# Patient Record
Sex: Male | Born: 1940 | Race: White | Hispanic: No | Marital: Married | Smoking: Current some day smoker
Health system: Southern US, Community
[De-identification: ages and names within clinical notes are randomized; demographics above are authoritative.]

## PROBLEM LIST (undated history)

## (undated) DIAGNOSIS — E785 Hyperlipidemia, unspecified: Secondary | ICD-10-CM

## (undated) DIAGNOSIS — I779 Disorder of arteries and arterioles, unspecified: Secondary | ICD-10-CM

## (undated) DIAGNOSIS — I2510 Atherosclerotic heart disease of native coronary artery without angina pectoris: Secondary | ICD-10-CM

## (undated) DIAGNOSIS — M109 Gout, unspecified: Secondary | ICD-10-CM

## (undated) DIAGNOSIS — I10 Essential (primary) hypertension: Secondary | ICD-10-CM

## (undated) DIAGNOSIS — E119 Type 2 diabetes mellitus without complications: Secondary | ICD-10-CM

## (undated) DIAGNOSIS — I4891 Unspecified atrial fibrillation: Secondary | ICD-10-CM

## (undated) HISTORY — PX: SPINE SURGERY: SHX786

## (undated) HISTORY — PX: CORONARY STENT PLACEMENT: SHX1402

## (undated) HISTORY — PX: REPLACEMENT TOTAL KNEE: SUR1224

## (undated) HISTORY — PX: PROSTATE SURGERY: SHX751

---

## 2020-02-29 ENCOUNTER — Emergency Department (HOSPITAL_COMMUNITY): Payer: Medicare Other | Admitting: Anesthesiology

## 2020-02-29 ENCOUNTER — Emergency Department (HOSPITAL_COMMUNITY): Payer: Medicare Other

## 2020-02-29 ENCOUNTER — Other Ambulatory Visit: Payer: Self-pay

## 2020-02-29 ENCOUNTER — Encounter (HOSPITAL_COMMUNITY): Admission: EM | Disposition: E | Payer: Self-pay | Source: Home / Self Care | Attending: Neurology

## 2020-02-29 ENCOUNTER — Encounter (HOSPITAL_COMMUNITY): Payer: Self-pay | Admitting: Physician Assistant

## 2020-02-29 ENCOUNTER — Inpatient Hospital Stay (HOSPITAL_COMMUNITY): Payer: Medicare Other

## 2020-02-29 ENCOUNTER — Inpatient Hospital Stay (HOSPITAL_COMMUNITY)
Admission: EM | Admit: 2020-02-29 | Discharge: 2020-04-07 | DRG: 023 | Disposition: E | Payer: Medicare Other | Attending: Neurology | Admitting: Neurology

## 2020-02-29 DIAGNOSIS — Z515 Encounter for palliative care: Secondary | ICD-10-CM | POA: Diagnosis not present

## 2020-02-29 DIAGNOSIS — G8929 Other chronic pain: Secondary | ICD-10-CM | POA: Diagnosis present

## 2020-02-29 DIAGNOSIS — H53462 Homonymous bilateral field defects, left side: Secondary | ICD-10-CM | POA: Diagnosis present

## 2020-02-29 DIAGNOSIS — I63423 Cerebral infarction due to embolism of bilateral anterior cerebral arteries: Secondary | ICD-10-CM

## 2020-02-29 DIAGNOSIS — G936 Cerebral edema: Secondary | ICD-10-CM | POA: Diagnosis present

## 2020-02-29 DIAGNOSIS — E876 Hypokalemia: Secondary | ICD-10-CM | POA: Diagnosis not present

## 2020-02-29 DIAGNOSIS — E1165 Type 2 diabetes mellitus with hyperglycemia: Secondary | ICD-10-CM | POA: Diagnosis present

## 2020-02-29 DIAGNOSIS — Z66 Do not resuscitate: Secondary | ICD-10-CM | POA: Diagnosis not present

## 2020-02-29 DIAGNOSIS — D696 Thrombocytopenia, unspecified: Secondary | ICD-10-CM | POA: Diagnosis not present

## 2020-02-29 DIAGNOSIS — I361 Nonrheumatic tricuspid (valve) insufficiency: Secondary | ICD-10-CM | POA: Diagnosis not present

## 2020-02-29 DIAGNOSIS — F17200 Nicotine dependence, unspecified, uncomplicated: Secondary | ICD-10-CM | POA: Diagnosis not present

## 2020-02-29 DIAGNOSIS — Z4659 Encounter for fitting and adjustment of other gastrointestinal appliance and device: Secondary | ICD-10-CM

## 2020-02-29 DIAGNOSIS — M549 Dorsalgia, unspecified: Secondary | ICD-10-CM | POA: Diagnosis present

## 2020-02-29 DIAGNOSIS — J9600 Acute respiratory failure, unspecified whether with hypoxia or hypercapnia: Secondary | ICD-10-CM | POA: Diagnosis not present

## 2020-02-29 DIAGNOSIS — R509 Fever, unspecified: Secondary | ICD-10-CM

## 2020-02-29 DIAGNOSIS — E870 Hyperosmolality and hypernatremia: Secondary | ICD-10-CM | POA: Diagnosis not present

## 2020-02-29 DIAGNOSIS — N400 Enlarged prostate without lower urinary tract symptoms: Secondary | ICD-10-CM | POA: Diagnosis present

## 2020-02-29 DIAGNOSIS — J9621 Acute and chronic respiratory failure with hypoxia: Secondary | ICD-10-CM | POA: Diagnosis not present

## 2020-02-29 DIAGNOSIS — Z6831 Body mass index (BMI) 31.0-31.9, adult: Secondary | ICD-10-CM | POA: Diagnosis not present

## 2020-02-29 DIAGNOSIS — J988 Other specified respiratory disorders: Secondary | ICD-10-CM | POA: Diagnosis not present

## 2020-02-29 DIAGNOSIS — E1151 Type 2 diabetes mellitus with diabetic peripheral angiopathy without gangrene: Secondary | ICD-10-CM | POA: Diagnosis present

## 2020-02-29 DIAGNOSIS — I63411 Cerebral infarction due to embolism of right middle cerebral artery: Secondary | ICD-10-CM | POA: Diagnosis present

## 2020-02-29 DIAGNOSIS — R627 Adult failure to thrive: Secondary | ICD-10-CM | POA: Diagnosis not present

## 2020-02-29 DIAGNOSIS — K219 Gastro-esophageal reflux disease without esophagitis: Secondary | ICD-10-CM

## 2020-02-29 DIAGNOSIS — Z9911 Dependence on respirator [ventilator] status: Secondary | ICD-10-CM

## 2020-02-29 DIAGNOSIS — R0603 Acute respiratory distress: Secondary | ICD-10-CM | POA: Diagnosis not present

## 2020-02-29 DIAGNOSIS — Z20822 Contact with and (suspected) exposure to covid-19: Secondary | ICD-10-CM | POA: Diagnosis present

## 2020-02-29 DIAGNOSIS — Z8249 Family history of ischemic heart disease and other diseases of the circulatory system: Secondary | ICD-10-CM

## 2020-02-29 DIAGNOSIS — R791 Abnormal coagulation profile: Secondary | ICD-10-CM | POA: Diagnosis present

## 2020-02-29 DIAGNOSIS — I2510 Atherosclerotic heart disease of native coronary artery without angina pectoris: Secondary | ICD-10-CM | POA: Diagnosis present

## 2020-02-29 DIAGNOSIS — J9601 Acute respiratory failure with hypoxia: Secondary | ICD-10-CM | POA: Diagnosis not present

## 2020-02-29 DIAGNOSIS — E669 Obesity, unspecified: Secondary | ICD-10-CM | POA: Diagnosis present

## 2020-02-29 DIAGNOSIS — E7800 Pure hypercholesterolemia, unspecified: Secondary | ICD-10-CM | POA: Diagnosis not present

## 2020-02-29 DIAGNOSIS — J690 Pneumonitis due to inhalation of food and vomit: Secondary | ICD-10-CM | POA: Diagnosis not present

## 2020-02-29 DIAGNOSIS — E119 Type 2 diabetes mellitus without complications: Secondary | ICD-10-CM

## 2020-02-29 DIAGNOSIS — J9622 Acute and chronic respiratory failure with hypercapnia: Secondary | ICD-10-CM | POA: Diagnosis not present

## 2020-02-29 DIAGNOSIS — I639 Cerebral infarction, unspecified: Secondary | ICD-10-CM

## 2020-02-29 DIAGNOSIS — R1310 Dysphagia, unspecified: Secondary | ICD-10-CM | POA: Diagnosis present

## 2020-02-29 DIAGNOSIS — I480 Paroxysmal atrial fibrillation: Secondary | ICD-10-CM | POA: Diagnosis present

## 2020-02-29 DIAGNOSIS — I6389 Other cerebral infarction: Secondary | ICD-10-CM | POA: Diagnosis not present

## 2020-02-29 DIAGNOSIS — I482 Chronic atrial fibrillation, unspecified: Secondary | ICD-10-CM | POA: Diagnosis not present

## 2020-02-29 DIAGNOSIS — D6832 Hemorrhagic disorder due to extrinsic circulating anticoagulants: Secondary | ICD-10-CM | POA: Diagnosis present

## 2020-02-29 DIAGNOSIS — Z7901 Long term (current) use of anticoagulants: Secondary | ICD-10-CM | POA: Diagnosis not present

## 2020-02-29 DIAGNOSIS — I63511 Cerebral infarction due to unspecified occlusion or stenosis of right middle cerebral artery: Secondary | ICD-10-CM | POA: Diagnosis present

## 2020-02-29 DIAGNOSIS — M109 Gout, unspecified: Secondary | ICD-10-CM | POA: Diagnosis present

## 2020-02-29 DIAGNOSIS — I63429 Cerebral infarction due to embolism of unspecified anterior cerebral artery: Secondary | ICD-10-CM

## 2020-02-29 DIAGNOSIS — E785 Hyperlipidemia, unspecified: Secondary | ICD-10-CM | POA: Diagnosis present

## 2020-02-29 DIAGNOSIS — I10 Essential (primary) hypertension: Secondary | ICD-10-CM | POA: Diagnosis present

## 2020-02-29 DIAGNOSIS — Z96651 Presence of right artificial knee joint: Secondary | ICD-10-CM | POA: Diagnosis present

## 2020-02-29 DIAGNOSIS — Z823 Family history of stroke: Secondary | ICD-10-CM

## 2020-02-29 DIAGNOSIS — I70208 Unspecified atherosclerosis of native arteries of extremities, other extremity: Secondary | ICD-10-CM | POA: Diagnosis present

## 2020-02-29 DIAGNOSIS — R29704 NIHSS score 4: Secondary | ICD-10-CM | POA: Diagnosis present

## 2020-02-29 DIAGNOSIS — R1312 Dysphagia, oropharyngeal phase: Secondary | ICD-10-CM | POA: Diagnosis not present

## 2020-02-29 DIAGNOSIS — Z7189 Other specified counseling: Secondary | ICD-10-CM | POA: Diagnosis not present

## 2020-02-29 DIAGNOSIS — I350 Nonrheumatic aortic (valve) stenosis: Secondary | ICD-10-CM | POA: Diagnosis not present

## 2020-02-29 DIAGNOSIS — F17290 Nicotine dependence, other tobacco product, uncomplicated: Secondary | ICD-10-CM | POA: Diagnosis present

## 2020-02-29 DIAGNOSIS — D72829 Elevated white blood cell count, unspecified: Secondary | ICD-10-CM | POA: Diagnosis not present

## 2020-02-29 DIAGNOSIS — I63131 Cerebral infarction due to embolism of right carotid artery: Secondary | ICD-10-CM | POA: Diagnosis not present

## 2020-02-29 DIAGNOSIS — I6521 Occlusion and stenosis of right carotid artery: Secondary | ICD-10-CM | POA: Diagnosis not present

## 2020-02-29 DIAGNOSIS — R29810 Facial weakness: Secondary | ICD-10-CM | POA: Diagnosis not present

## 2020-02-29 DIAGNOSIS — G8194 Hemiplegia, unspecified affecting left nondominant side: Secondary | ICD-10-CM | POA: Diagnosis present

## 2020-02-29 DIAGNOSIS — I4891 Unspecified atrial fibrillation: Secondary | ICD-10-CM | POA: Diagnosis present

## 2020-02-29 DIAGNOSIS — T45515A Adverse effect of anticoagulants, initial encounter: Secondary | ICD-10-CM | POA: Diagnosis present

## 2020-02-29 DIAGNOSIS — Z955 Presence of coronary angioplasty implant and graft: Secondary | ICD-10-CM

## 2020-02-29 HISTORY — PX: IR ANGIO INTRA EXTRACRAN SEL COM CAROTID INNOMINATE UNI L MOD SED: IMG5358

## 2020-02-29 HISTORY — PX: IR PERCUTANEOUS ART THROMBECTOMY/INFUSION INTRACRANIAL INC DIAG ANGIO: IMG6087

## 2020-02-29 HISTORY — PX: IR ANGIO INTRA EXTRACRAN SEL INTERNAL CAROTID UNI L MOD SED: IMG5361

## 2020-02-29 HISTORY — DX: Gout, unspecified: M10.9

## 2020-02-29 HISTORY — DX: Type 2 diabetes mellitus without complications: E11.9

## 2020-02-29 HISTORY — DX: Disorder of arteries and arterioles, unspecified: I77.9

## 2020-02-29 HISTORY — DX: Unspecified atrial fibrillation: I48.91

## 2020-02-29 HISTORY — PX: IR INTRAVSC STENT CERV CAROTID W/O EMB-PROT MOD SED INC ANGIO: IMG2304

## 2020-02-29 HISTORY — DX: Atherosclerotic heart disease of native coronary artery without angina pectoris: I25.10

## 2020-02-29 HISTORY — PX: IR CT HEAD LTD: IMG2386

## 2020-02-29 HISTORY — DX: Essential (primary) hypertension: I10

## 2020-02-29 HISTORY — DX: Hyperlipidemia, unspecified: E78.5

## 2020-02-29 HISTORY — PX: IR US GUIDE VASC ACCESS RIGHT: IMG2390

## 2020-02-29 HISTORY — PX: RADIOLOGY WITH ANESTHESIA: SHX6223

## 2020-02-29 LAB — POCT I-STAT 7, (LYTES, BLD GAS, ICA,H+H)
Acid-base deficit: 1 mmol/L (ref 0.0–2.0)
Bicarbonate: 23.8 mmol/L (ref 20.0–28.0)
Calcium, Ion: 1.21 mmol/L (ref 1.15–1.40)
HCT: 38 % — ABNORMAL LOW (ref 39.0–52.0)
Hemoglobin: 12.9 g/dL — ABNORMAL LOW (ref 13.0–17.0)
O2 Saturation: 100 %
Patient temperature: 98.6
Potassium: 4.4 mmol/L (ref 3.5–5.1)
Sodium: 139 mmol/L (ref 135–145)
TCO2: 25 mmol/L (ref 22–32)
pCO2 arterial: 37.5 mmHg (ref 32.0–48.0)
pH, Arterial: 7.412 (ref 7.350–7.450)
pO2, Arterial: 195 mmHg — ABNORMAL HIGH (ref 83.0–108.0)

## 2020-02-29 LAB — I-STAT CHEM 8, ED
BUN: 29 mg/dL — ABNORMAL HIGH (ref 8–23)
BUN: 15 mg/dL (ref 8–23)
Calcium, Ion: 1.13 mmol/L — ABNORMAL LOW (ref 1.15–1.40)
Calcium, Ion: 0.93 mmol/L — ABNORMAL LOW (ref 1.15–1.40)
Chloride: 102 mmol/L (ref 98–111)
Chloride: 114 mmol/L — ABNORMAL HIGH (ref 98–111)
Creatinine, Ser: 1 mg/dL (ref 0.61–1.24)
Creatinine, Ser: 0.3 mg/dL — ABNORMAL LOW (ref 0.61–1.24)
Glucose, Bld: 163 mg/dL — ABNORMAL HIGH (ref 70–99)
Glucose, Bld: 90 mg/dL (ref 70–99)
HCT: 42 % (ref 39.0–52.0)
HCT: 23 % — ABNORMAL LOW (ref 39.0–52.0)
Hemoglobin: 14.3 g/dL (ref 13.0–17.0)
Hemoglobin: 7.8 g/dL — ABNORMAL LOW (ref 13.0–17.0)
Potassium: 3.6 mmol/L (ref 3.5–5.1)
Potassium: 2.4 mmol/L — CL (ref 3.5–5.1)
Sodium: 140 mmol/L (ref 135–145)
Sodium: 147 mmol/L — ABNORMAL HIGH (ref 135–145)
TCO2: 25 mmol/L (ref 22–32)
TCO2: 17 mmol/L — ABNORMAL LOW (ref 22–32)

## 2020-02-29 LAB — CBC
HCT: 42.1 % (ref 39.0–52.0)
HCT: 39.3 % (ref 39.0–52.0)
Hemoglobin: 13.6 g/dL (ref 13.0–17.0)
Hemoglobin: 13.1 g/dL (ref 13.0–17.0)
MCH: 32.5 pg (ref 26.0–34.0)
MCH: 31.7 pg (ref 26.0–34.0)
MCHC: 32.3 g/dL (ref 30.0–36.0)
MCHC: 33.3 g/dL (ref 30.0–36.0)
MCV: 100.5 fL — ABNORMAL HIGH (ref 80.0–100.0)
MCV: 95.2 fL (ref 80.0–100.0)
Platelets: 161 10*3/uL (ref 150–400)
Platelets: 125 10*3/uL — ABNORMAL LOW (ref 150–400)
RBC: 4.19 MIL/uL — ABNORMAL LOW (ref 4.22–5.81)
RBC: 4.13 MIL/uL — ABNORMAL LOW (ref 4.22–5.81)
RDW: 13.2 % (ref 11.5–15.5)
RDW: 14.1 % (ref 11.5–15.5)
WBC: 6.3 10*3/uL (ref 4.0–10.5)
WBC: 6.1 10*3/uL (ref 4.0–10.5)
nRBC: 0 % (ref 0.0–0.2)
nRBC: 0 % (ref 0.0–0.2)

## 2020-02-29 LAB — PROTIME-INR
INR: 1.4 — ABNORMAL HIGH (ref 0.8–1.2)
Prothrombin Time: 16.9 seconds — ABNORMAL HIGH (ref 11.4–15.2)

## 2020-02-29 LAB — DIFFERENTIAL
Abs Immature Granulocytes: 0.02 10*3/uL (ref 0.00–0.07)
Basophils Absolute: 0.1 10*3/uL (ref 0.0–0.1)
Basophils Relative: 1 %
Eosinophils Absolute: 0.3 10*3/uL (ref 0.0–0.5)
Eosinophils Relative: 4 %
Immature Granulocytes: 0 %
Lymphocytes Relative: 18 %
Lymphs Abs: 1.2 10*3/uL (ref 0.7–4.0)
Monocytes Absolute: 0.6 10*3/uL (ref 0.1–1.0)
Monocytes Relative: 9 %
Neutro Abs: 4.2 10*3/uL (ref 1.7–7.7)
Neutrophils Relative %: 68 %

## 2020-02-29 LAB — COMPREHENSIVE METABOLIC PANEL
ALT: 23 U/L (ref 0–44)
AST: 21 U/L (ref 15–41)
Albumin: 3.4 g/dL — ABNORMAL LOW (ref 3.5–5.0)
Alkaline Phosphatase: 82 U/L (ref 38–126)
Anion gap: 8 (ref 5–15)
BUN: 26 mg/dL — ABNORMAL HIGH (ref 8–23)
CO2: 29 mmol/L (ref 22–32)
Calcium: 9.3 mg/dL (ref 8.9–10.3)
Chloride: 103 mmol/L (ref 98–111)
Creatinine, Ser: 1.06 mg/dL (ref 0.61–1.24)
GFR, Estimated: >60 mL/min (ref >60)
Glucose, Bld: 176 mg/dL — ABNORMAL HIGH (ref 70–99)
Potassium: 3.7 mmol/L (ref 3.5–5.1)
Sodium: 140 mmol/L (ref 135–145)
Total Bilirubin: 1.3 mg/dL — ABNORMAL HIGH (ref 0.3–1.2)
Total Protein: 6.3 g/dL — ABNORMAL LOW (ref 6.5–8.1)

## 2020-02-29 LAB — ABO/RH: ABO/RH(D): A POS

## 2020-02-29 LAB — MRSA PCR SCREENING: MRSA by PCR: NEGATIVE

## 2020-02-29 LAB — APTT: aPTT: 30 seconds (ref 24–36)

## 2020-02-29 LAB — GLUCOSE, CAPILLARY: Glucose-Capillary: 174 mg/dL — ABNORMAL HIGH (ref 70–99)

## 2020-02-29 LAB — PREPARE RBC (CROSSMATCH)

## 2020-02-29 LAB — CBG MONITORING, ED: Glucose-Capillary: 167 mg/dL — ABNORMAL HIGH (ref 70–99)

## 2020-02-29 LAB — RESPIRATORY PANEL BY RT PCR (FLU A&B, COVID)
Influenza A by PCR: NEGATIVE
Influenza B by PCR: NEGATIVE
SARS Coronavirus 2 by RT PCR: NEGATIVE

## 2020-02-29 LAB — POCT ACTIVATED CLOTTING TIME: Activated Clotting Time: 164 seconds

## 2020-02-29 SURGERY — IR WITH ANESTHESIA
Anesthesia: General

## 2020-02-29 MED ORDER — TICAGRELOR 90 MG PO TABS
ORAL_TABLET | ORAL | Status: AC
  Filled 2020-02-29: qty 2

## 2020-02-29 MED ORDER — CLEVIDIPINE BUTYRATE 0.5 MG/ML IV EMUL: 0.0000 mg/h | INTRAVENOUS | Status: DC

## 2020-02-29 MED ORDER — ALTEPLASE (STROKE) FULL DOSE INFUSION
0.9000 mg/kg | Freq: Once | INTRAVENOUS | Status: DC
  Filled 2020-02-29: qty 100

## 2020-02-29 MED ORDER — PROPOFOL 1000 MG/100ML IV EMUL
0.0000 ug/kg/min | INTRAVENOUS | Status: DC
  Administered 2020-02-29: 45 ug/kg/min via INTRAVENOUS
  Administered 2020-03-01 (×2): 30 ug/kg/min via INTRAVENOUS
  Administered 2020-03-01: 15 ug/kg/min via INTRAVENOUS
  Administered 2020-03-01: 25 ug/kg/min via INTRAVENOUS
  Administered 2020-03-02: 15 ug/kg/min via INTRAVENOUS
  Filled 2020-02-29 (×6): qty 100

## 2020-02-29 MED ORDER — IOHEXOL 240 MG/ML SOLN
INTRAMUSCULAR | Status: AC
  Filled 2020-02-29: qty 200

## 2020-02-29 MED ORDER — SODIUM CHLORIDE 0.9% IV SOLUTION: Freq: Once | INTRAVENOUS | Status: DC

## 2020-02-29 MED ORDER — PHENYLEPHRINE HCL-NACL 10-0.9 MG/250ML-% IV SOLN
INTRAVENOUS | Status: DC | PRN
  Administered 2020-02-29: 30 ug/min via INTRAVENOUS

## 2020-02-29 MED ORDER — CANGRELOR BOLUS VIA INFUSION
INTRAVENOUS | Status: DC | PRN
  Administered 2020-02-29: 1305 ug via INTRAVENOUS

## 2020-02-29 MED ORDER — FENTANYL CITRATE (PF) 250 MCG/5ML IJ SOLN
INTRAMUSCULAR | Status: DC | PRN
  Administered 2020-02-29: 100 ug via INTRAVENOUS

## 2020-02-29 MED ORDER — SENNOSIDES-DOCUSATE SODIUM 8.6-50 MG PO TABS: 1.0000 | ORAL_TABLET | Freq: Every evening | ORAL | Status: DC | PRN

## 2020-02-29 MED ORDER — DEXAMETHASONE SODIUM PHOSPHATE 10 MG/ML IJ SOLN
INTRAMUSCULAR | Status: DC | PRN
  Administered 2020-02-29: 10 mg via INTRAVENOUS

## 2020-02-29 MED ORDER — EPTIFIBATIDE 20 MG/10ML IV SOLN
INTRAVENOUS | Status: AC
  Filled 2020-02-29: qty 10

## 2020-02-29 MED ORDER — ROCURONIUM BROMIDE 10 MG/ML (PF) SYRINGE
PREFILLED_SYRINGE | INTRAVENOUS | Status: DC | PRN
  Administered 2020-02-29: 20 mg via INTRAVENOUS
  Administered 2020-02-29: 60 mg via INTRAVENOUS
  Administered 2020-02-29: 20 mg via INTRAVENOUS
  Administered 2020-02-29: 50 mg via INTRAVENOUS

## 2020-02-29 MED ORDER — IOHEXOL 300 MG/ML  SOLN
50.0000 mL | Freq: Once | INTRAMUSCULAR | Status: AC | PRN
  Administered 2020-02-29: 40 mL via INTRA_ARTERIAL

## 2020-02-29 MED ORDER — SODIUM CHLORIDE 0.9 % IV SOLN: 50.0000 mL | Freq: Once | INTRAVENOUS | Status: DC

## 2020-02-29 MED ORDER — ALBUMIN HUMAN 5 % IV SOLN: INTRAVENOUS | Status: DC | PRN

## 2020-02-29 MED ORDER — SODIUM CHLORIDE 0.9% FLUSH: 3.0000 mL | Freq: Once | INTRAVENOUS | Status: DC

## 2020-02-29 MED ORDER — ORAL CARE MOUTH RINSE: 15.0000 mL | OROMUCOSAL | Status: DC

## 2020-02-29 MED ORDER — ORAL CARE MOUTH RINSE
15.0000 mL | OROMUCOSAL | Status: DC
  Administered 2020-02-29 – 2020-03-02 (×18): 15 mL via OROMUCOSAL

## 2020-02-29 MED ORDER — CANGRELOR TETRASODIUM 50 MG IV SOLR
INTRAVENOUS | Status: AC
  Filled 2020-02-29: qty 50

## 2020-02-29 MED ORDER — SUCCINYLCHOLINE CHLORIDE 200 MG/10ML IV SOSY
PREFILLED_SYRINGE | INTRAVENOUS | Status: DC | PRN
  Administered 2020-02-29: 100 mg via INTRAVENOUS

## 2020-02-29 MED ORDER — ASPIRIN 325 MG PO TABS
ORAL_TABLET | ORAL | Status: DC | PRN
  Administered 2020-02-29: 650 mg

## 2020-02-29 MED ORDER — ACETAMINOPHEN 325 MG PO TABS
650.0000 mg | ORAL_TABLET | ORAL | Status: DC | PRN
  Filled 2020-02-29: qty 2

## 2020-02-29 MED ORDER — POTASSIUM CHLORIDE 10 MEQ/100ML IV SOLN
10.0000 meq | INTRAVENOUS | Status: AC
  Administered 2020-02-29 (×2): 10 meq via INTRAVENOUS
  Filled 2020-02-29 (×3): qty 100

## 2020-02-29 MED ORDER — POLYETHYLENE GLYCOL 3350 17 G PO PACK
17.0000 g | PACK | Freq: Every day | ORAL | Status: DC
  Administered 2020-03-02 – 2020-03-10 (×3): 17 g
  Filled 2020-02-29 (×5): qty 1

## 2020-02-29 MED ORDER — VERAPAMIL HCL 2.5 MG/ML IV SOLN
INTRAVENOUS | Status: AC
  Filled 2020-02-29: qty 2

## 2020-02-29 MED ORDER — ENOXAPARIN SODIUM 40 MG/0.4ML ~~LOC~~ SOLN
40.0000 mg | SUBCUTANEOUS | Status: DC
  Administered 2020-02-29 – 2020-03-09 (×10): 40 mg via SUBCUTANEOUS
  Filled 2020-02-29 (×10): qty 0.4

## 2020-02-29 MED ORDER — CHLORHEXIDINE GLUCONATE CLOTH 2 % EX PADS
6.0000 | MEDICATED_PAD | Freq: Every day | CUTANEOUS | Status: DC
  Administered 2020-02-29 – 2020-03-11 (×12): 6 via TOPICAL

## 2020-02-29 MED ORDER — INSULIN ASPART 100 UNIT/ML ~~LOC~~ SOLN
0.0000 [IU] | SUBCUTANEOUS | Status: DC
  Administered 2020-02-29 – 2020-03-01 (×2): 3 [IU] via SUBCUTANEOUS
  Administered 2020-03-01: 2 [IU] via SUBCUTANEOUS
  Administered 2020-03-01 – 2020-03-02 (×2): 3 [IU] via SUBCUTANEOUS
  Administered 2020-03-02 (×2): 2 [IU] via SUBCUTANEOUS
  Administered 2020-03-03: 5 [IU] via SUBCUTANEOUS
  Administered 2020-03-03: 2 [IU] via SUBCUTANEOUS
  Administered 2020-03-03: 3 [IU] via SUBCUTANEOUS
  Administered 2020-03-03: 8 [IU] via SUBCUTANEOUS
  Administered 2020-03-03: 11 [IU] via SUBCUTANEOUS
  Administered 2020-03-03 – 2020-03-04 (×2): 5 [IU] via SUBCUTANEOUS
  Administered 2020-03-04 (×3): 3 [IU] via SUBCUTANEOUS
  Administered 2020-03-04: 2 [IU] via SUBCUTANEOUS
  Administered 2020-03-04 (×2): 3 [IU] via SUBCUTANEOUS
  Administered 2020-03-05: 5 [IU] via SUBCUTANEOUS
  Administered 2020-03-05: 3 [IU] via SUBCUTANEOUS
  Administered 2020-03-05 (×3): 5 [IU] via SUBCUTANEOUS
  Administered 2020-03-06: 3 [IU] via SUBCUTANEOUS
  Administered 2020-03-06 (×3): 5 [IU] via SUBCUTANEOUS
  Administered 2020-03-06: 3 [IU] via SUBCUTANEOUS
  Administered 2020-03-06: 8 [IU] via SUBCUTANEOUS
  Administered 2020-03-07 (×2): 3 [IU] via SUBCUTANEOUS
  Administered 2020-03-07: 8 [IU] via SUBCUTANEOUS
  Administered 2020-03-07: 2 [IU] via SUBCUTANEOUS
  Administered 2020-03-07: 5 [IU] via SUBCUTANEOUS
  Administered 2020-03-07: 3 [IU] via SUBCUTANEOUS
  Administered 2020-03-07: 5 [IU] via SUBCUTANEOUS
  Administered 2020-03-08: 3 [IU] via SUBCUTANEOUS
  Administered 2020-03-08 (×4): 5 [IU] via SUBCUTANEOUS
  Administered 2020-03-08: 8 [IU] via SUBCUTANEOUS
  Administered 2020-03-09: 3 [IU] via SUBCUTANEOUS
  Administered 2020-03-09 (×4): 5 [IU] via SUBCUTANEOUS
  Administered 2020-03-10 (×2): 3 [IU] via SUBCUTANEOUS
  Administered 2020-03-10: 5 [IU] via SUBCUTANEOUS
  Administered 2020-03-10: 8 [IU] via SUBCUTANEOUS
  Administered 2020-03-10: 3 [IU] via SUBCUTANEOUS

## 2020-02-29 MED ORDER — ACETAMINOPHEN 160 MG/5ML PO SOLN
650.0000 mg | ORAL | Status: DC | PRN
  Administered 2020-03-02 – 2020-03-08 (×8): 650 mg
  Filled 2020-02-29 (×8): qty 20.3

## 2020-02-29 MED ORDER — ACETAMINOPHEN 650 MG RE SUPP: 650.0000 mg | RECTAL | Status: DC | PRN

## 2020-02-29 MED ORDER — LACTATED RINGERS IV SOLN: INTRAVENOUS | Status: DC | PRN

## 2020-02-29 MED ORDER — DOCUSATE SODIUM 50 MG/5ML PO LIQD
100.0000 mg | Freq: Two times a day (BID) | ORAL | Status: DC
  Administered 2020-03-01 – 2020-03-10 (×8): 100 mg
  Filled 2020-02-29 (×14): qty 10

## 2020-02-29 MED ORDER — MIDAZOLAM HCL 2 MG/2ML IJ SOLN: 1.0000 mg | INTRAMUSCULAR | Status: DC | PRN

## 2020-02-29 MED ORDER — ASPIRIN 81 MG PO CHEW
CHEWABLE_TABLET | ORAL | Status: AC
  Filled 2020-02-29: qty 1

## 2020-02-29 MED ORDER — LIDOCAINE 2% (20 MG/ML) 5 ML SYRINGE
INTRAMUSCULAR | Status: DC | PRN
  Administered 2020-02-29: 60 mg via INTRAVENOUS

## 2020-02-29 MED ORDER — SODIUM CHLORIDE 0.9 % IV SOLN
INTRAVENOUS | Status: DC | PRN
  Administered 2020-02-29: 2 ug/kg/min via INTRAVENOUS

## 2020-02-29 MED ORDER — FENTANYL CITRATE (PF) 250 MCG/5ML IJ SOLN
INTRAMUSCULAR | Status: AC
  Filled 2020-02-29: qty 5

## 2020-02-29 MED ORDER — TIROFIBAN HCL IN NACL 5-0.9 MG/100ML-% IV SOLN
INTRAVENOUS | Status: AC
  Filled 2020-02-29: qty 100

## 2020-02-29 MED ORDER — PANTOPRAZOLE SODIUM 40 MG IV SOLR
40.0000 mg | Freq: Every day | INTRAVENOUS | Status: DC
  Administered 2020-02-29 – 2020-03-01 (×2): 40 mg via INTRAVENOUS
  Filled 2020-02-29: qty 40

## 2020-02-29 MED ORDER — CHLORHEXIDINE GLUCONATE 0.12% ORAL RINSE (MEDLINE KIT)
15.0000 mL | Freq: Two times a day (BID) | OROMUCOSAL | Status: DC
  Administered 2020-02-29 – 2020-03-02 (×4): 15 mL via OROMUCOSAL

## 2020-02-29 MED ORDER — CLOPIDOGREL BISULFATE 300 MG PO TABS
ORAL_TABLET | ORAL | Status: AC
  Filled 2020-02-29: qty 1

## 2020-02-29 MED ORDER — IOHEXOL 300 MG/ML  SOLN
150.0000 mL | Freq: Once | INTRAMUSCULAR | Status: AC | PRN
  Administered 2020-02-29: 100 mL via INTRA_ARTERIAL

## 2020-02-29 MED ORDER — IOHEXOL 350 MG/ML SOLN
40.0000 mL | Freq: Once | INTRAVENOUS | Status: AC | PRN
  Administered 2020-02-29: 40 mL via INTRAVENOUS

## 2020-02-29 MED ORDER — ASPIRIN 325 MG PO TABS
ORAL_TABLET | ORAL | Status: AC
  Filled 2020-02-29: qty 2

## 2020-02-29 MED ORDER — FENTANYL CITRATE (PF) 100 MCG/2ML IJ SOLN: 25.0000 ug | INTRAMUSCULAR | Status: DC | PRN

## 2020-02-29 MED ORDER — SODIUM CHLORIDE 0.9 % IV SOLN: INTRAVENOUS | Status: DC

## 2020-02-29 MED ORDER — STROKE: EARLY STAGES OF RECOVERY BOOK
Freq: Once | Status: DC
  Filled 2020-02-29: qty 1

## 2020-02-29 MED ORDER — CHLORHEXIDINE GLUCONATE 0.12% ORAL RINSE (MEDLINE KIT): 15.0000 mL | Freq: Two times a day (BID) | OROMUCOSAL | Status: DC

## 2020-02-29 MED ORDER — IOHEXOL 350 MG/ML SOLN
75.0000 mL | Freq: Once | INTRAVENOUS | Status: AC | PRN
  Administered 2020-02-29: 75 mL via INTRAVENOUS

## 2020-02-29 MED ORDER — PROPOFOL 10 MG/ML IV BOLUS
INTRAVENOUS | Status: DC | PRN
  Administered 2020-02-29: 100 mg via INTRAVENOUS
  Administered 2020-02-29: 40 ug/kg/min via INTRAVENOUS

## 2020-02-29 NOTE — Anesthesia Postprocedure Evaluation (Signed)
Anesthesia Post Note  Patient: Richard Farmer  Procedure(s) Performed: IR WITH ANESTHESIA - CODE STROKE (N/A )     Patient location during evaluation: SICU Anesthesia Type: General Level of consciousness: sedated Pain management: pain level controlled Vital Signs Assessment: post-procedure vital signs reviewed and stable Respiratory status: patient remains intubated per anesthesia plan Cardiovascular status: stable Postop Assessment: no apparent nausea or vomiting Anesthetic complications: no   No complications documented.  Last Vitals:  Vitals:   03/08/2020 2000 03-08-2020 2100  BP: (!) 150/109 (!) 141/102  Pulse:  76  Resp: 15 16  SpO2:  100%    Last Pain:  Vitals:   Mar 08, 2020 1403  PainSc: 0-No pain                 Jazir Newey DAVID

## 2020-02-29 NOTE — Transfer of Care (Signed)
Immediate Anesthesia Transfer of Care Note  Patient: Richard Farmer  Procedure(s) Performed: IR WITH ANESTHESIA - CODE STROKE (N/A )  Patient Location: ICU  Anesthesia Type:General  Level of Consciousness: Patient remains intubated per anesthesia plan  Airway & Oxygen Therapy: Patient remains intubated per anesthesia plan and Patient placed on Ventilator (see vital sign flow sheet for setting)  Post-op Assessment: Report given to RN and Post -op Vital signs reviewed and stable  Post vital signs: Reviewed and stable  Last Vitals:  Vitals Value Taken Time  BP 172/116 02/23/2020 1944  Temp    Pulse 78 02/08/2020 1944  Resp 17 02/11/2020 1954  SpO2 100 % 02/22/2020 1944  Vitals shown include unvalidated device data.  Last Pain:  Vitals:   02/24/2020 1403  PainSc: 0-No pain     Report to Fleet Contras RN in ICU, RT at bedside applied to ventilator, breath sounds present bilaterally, VSS, informed RN of SBP goals of 160-193mmHg, neo, propofol and K replacement infusing per orders, SBP within goal range, foley maintained, transfer of patient care in safe and stable condition.   2 units of blood administered during procedure for Hgb 7.8; informed RN of need for CBC.  Complications: No complications documented.

## 2020-02-29 NOTE — Sedation Documentation (Signed)
Attempted to call report to 4N ICU RN to notify that patient will be coming to unit intubated and needed a ventilator set up, and on multiple IV drips.  Unit secretary stated it is shift change and to call back later

## 2020-02-29 NOTE — Procedures (Addendum)
Neuro-Interventional Radiology  Post Cerebral Angiogram Procedure Note  Operator:    Dr. Loreta Ave Assistant:   None  History:   79 yo male presents with acute left sided symptoms, with right ICA occlusion.  Waxing waning physical exam, worsening in the upright position, improved in the supine position.   History of left CEA, with CT changes of chronic dissection.   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   Procedure: US guided right CFA access  Cervical & Cerebral Angiogram  PTA and stenting of the right ICA, 66mm-7mm Xact stent, presuming a permissive lesion of the bifurcation.   Attempt of thrombectomy of the intracranial ICA below terminus.   Attempt at thrombectomy of the right MCA/M1 (embolism new territory)   Deployment of Angioseal  Flat Panel CT in NIR  Combination of stent retrievers and aspiration was performed.  Embotrap and Solitaire were used, as well as aspiration catheters.   Findings:    Initial: ICA occlusion Final: ICA remains occluded after ICA stent, secondary to occlusion at the supraclinoid segment.  Right M1 remains occluded, embolization to new territory.  Anesthesia:   GETA  EBL:    500cc     Complication:  Embolization new territory, right m1   Recommendations: - Will remain intubated overnight - right hip straight overnight - permissive HTN, up to 180 SBP - Frequent NV checks - Repeat CT or MRI imaging recommended within 36 hours, discretion of Neurology - NIR to follow Family updated: Richard Farmer (wife) [phone]    Signed,  Yvone Neu. Loreta Ave, DO

## 2020-02-29 NOTE — Progress Notes (Signed)
Per RN, MRI can be done tomorrow due to chance of pt being extubated.

## 2020-02-29 NOTE — Anesthesia Preprocedure Evaluation (Addendum)
Anesthesia Evaluation  Patient identified by MRN, date of birth, ID band Patient awake    Reviewed: Allergy & Precautions, H&P , NPO status , Patient's Chart, lab work & pertinent test results  Airway Mallampati: II  TM Distance: >3 FB Neck ROM: Full    Dental no notable dental hx. (+) Teeth Intact, Dental Advisory Given   Pulmonary neg pulmonary ROS,    Pulmonary exam normal breath sounds clear to auscultation       Cardiovascular hypertension, + Peripheral Vascular Disease  Atrial Fibrillation  Rhythm:Irregular Rate:Normal     Neuro/Psych CVA, Residual Symptoms negative psych ROS   GI/Hepatic negative GI ROS, Neg liver ROS,   Endo/Other  negative endocrine ROS  Renal/GU negative Renal ROS  negative genitourinary   Musculoskeletal   Abdominal   Peds  Hematology negative hematology ROS (+)   Anesthesia Other Findings   Reproductive/Obstetrics negative OB ROS                            Anesthesia Physical Anesthesia Plan  ASA: III and emergent  Anesthesia Plan: General   Post-op Pain Management:    Induction: Intravenous, Rapid sequence and Cricoid pressure planned  PONV Risk Score and Plan: 2 and Treatment may vary due to age or medical condition  Airway Management Planned: Oral ETT  Additional Equipment: Arterial line  Intra-op Plan:   Post-operative Plan: Extubation in OR and Possible Post-op intubation/ventilation  Informed Consent: I have reviewed the patients History and Physical, chart, labs and discussed the procedure including the risks, benefits and alternatives for the proposed anesthesia with the patient or authorized representative who has indicated his/her understanding and acceptance.     Dental advisory given  Plan Discussed with: CRNA  Anesthesia Plan Comments:         Anesthesia Quick Evaluation

## 2020-02-29 NOTE — H&P (Addendum)
NEUROLOGY CONSULTATION NOTE   Date of service: February 29, 2020 Patient Name: Richard Farmer MRN:  [id number] DOB:  [birth year] Reason for consult: "Stroke code"  History of Present Illness  Richard Farmer is a 79 y.o. male with PMH significant for afibb on warfarin, known carotid stenosis who presents with acute onset left sided weakness.  Patient woke up this morning feeling weak in both legs. His wife Richard Farmer states that he was holding on to the walls while walking to the bathroom but he sometimes does that when he wakes up. He was able to dress up and seemed to be doing fine, he was walking normally and walked to the kitchen fine. He reported that his legs were weak again and family noted that while he attemped to sit in the chair, he was somewhat slumping towards the left and when he stood up, he was slumping to the left side. He looked different but was talking coherently.  He took his morning medications, he is on warfarin but his INR ar presentation was 1.4.  He was noted on initial evaluation to have left sided weakness with left arm drift and leg drift but his symptoms seem to have improved to an NIHSS of 4.  He was noted by me later during exam to have left visual field cut and throughout the time in the ED, he was noted to havefluctuation of his symptoms. He did discussed tPA for the noted visual field cut. However, by the time tPA was mixed and ready for administration, the hemianopsia resolved and we held off on tPA.  NIHSS: 1 for mild L arm drift but good L hand grip strength with intact Left hand push and pull MRS: 1 TPA: was offered to patient initially due to L hemianopsia but held off after resolution of the hemianopsia. Thrombectomy: Not pursued but given concern for unstable R ICA disease, case discussed with stroke team and with IR and sent for urgent stenting.   ROS   Unable to obtain due to the acuity of the situation.  Past History  History reviewed. No pertinent past  medical history. History reviewed. No pertinent surgical history. History reviewed. No pertinent family history. Social History   Socioeconomic History  . Marital status: Married    Spouse name: Not on file  . Number of children: Not on file  . Years of education: Not on file  . Highest education level: Not on file  Occupational History  . Not on file  Tobacco Use  . Smoking status: Not on file  Substance and Sexual Activity  . Alcohol use: Not on file  . Drug use: Not on file  . Sexual activity: Not on file  Other Topics Concern  . Not on file  Social History Narrative  . Not on file   Social Determinants of Health   Financial Resource Strain:   . Difficulty of Paying Living Expenses: Not on file  Food Insecurity:   . Worried About Programme researcher, broadcasting/film/video in the Last Year: Not on file  . Ran Out of Food in the Last Year: Not on file  Transportation Needs:   . Lack of Transportation (Medical): Not on file  . Lack of Transportation (Non-Medical): Not on file  Physical Activity:   . Days of Exercise per Week: Not on file  . Minutes of Exercise per Session: Not on file  Stress:   . Feeling of Stress : Not on file  Social Connections:   . Frequency of Communication  with Friends and Family: Not on file  . Frequency of Social Gatherings with Friends and Family: Not on file  . Attends Religious Services: Not on file  . Active Member of Clubs or Organizations: Not on file  . Attends Banker Meetings: Not on file  . Marital Status: Not on file   Not on File  Medications  (Not in a hospital admission)    Vitals   Vitals:   2020-03-08 1403 March 08, 2020 1404 March 08, 2020 1415 2020/03/08 1430  BP:  (!) 153/88 (!) 142/90 (!) 153/97  Pulse: 78 79 84 74  Resp: 18 17 18  (!) 21  SpO2: 99% 97%  100%  Weight: 87 kg     Height: 5\' 10"  (1.778 m)        Body mass index is 27.52 kg/m.  Physical Exam   General: Laying comfortably in bed; in no acute distress. HENT: Normal  oropharynx and mucosa. Normal external appearance of ears and nose. Neck: Supple, no pain or tenderness CV: No JVD. No peripheral edema. Pulmonary: Symmetric Chest rise. Normal respiratory effort. Abdomen: Soft to touch, non-tender. Ext: No cyanosis, edema, or deformity Skin: No rash. Normal palpation of skin.  Musculoskeletal: Normal digits and nails by inspection. No clubbing.  Neurologic Examination  Mental status/Cognition: Alert, oriented to self, place, month and year, good attention. Speech/language: Fluent, comprehension intact, object naming intact, repetition intact. Cranial nerves:   CN II Pupils equal and reactive to light, no VF deficits   CN III,IV,VI EOM intact, no gaze preference or deviation, no nystagmus   CN V normal sensation in V1, V2, and V3 segments bilaterally   CN VII no asymmetry, no nasolabial fold flattening   CN VIII normal hearing to speech   CN IX & X normal palatal elevation, no uvular deviation   CN XI 5/5 head turn and 5/5 shoulder shrug bilaterally   CN XII midline tongue protrusion   Motor:  Muscle bulk: poor, tone normal, pronator drift LUE drift tremor none Mvmt Root Nerve  Muscle Right Left Comments  SA C5/6 Ax Deltoid 5 5   EF C5/6 Mc Biceps 5 5   EE C6/7/8 Rad Triceps 5 5   WF C6/7 Med FCR 5 5   WE C7/8 PIN ECU 5 5   F Ab C8/T1 U ADM/FDI 5 4+   HF L1/2/3 Fem Illopsoas 5 5   KE L2/3/4 Fem Quad 5 5   DF L4/5 D Peron Tib Ant 5 5   PF S1/2 Tibial Grc/Sol 5 5    Reflexes:  Right Left Comments  Pectoralis      Biceps (C5/6) 1 1   Brachioradialis (C5/6) 1 1    Triceps (C6/7) 1 1    Patellar (L3/4) 1 1    Achilles (S1)      Hoffman      Plantar     Jaw jerk    Sensation:  Light touch Intact throughout   Pin prick    Temperature    Vibration   Proprioception    Coordination/Complex Motor:  - Finger to Nose intact BL - Heel to shin intact BL - Rapid alternating movement are normal  Labs   CBC:  Recent Labs  Lab  03-08-20 1317 March 08, 2020 1321  WBC 6.3  --   NEUTROABS 4.2  --   HGB 13.6 14.3  HCT 42.1 42.0  MCV 100.5*  --   PLT 161  --     Basic Metabolic Panel:  Lab Results  Component Value Date   NA 140 02/26/2020   K 3.6 02/20/2020   CO2 29 02/07/2020   GLUCOSE 163 (H) 02/26/2020   BUN 29 (H) 02/26/2020   CREATININE 1.00 02/27/2020   CALCIUM 9.3 03/03/2020   GFRNONAA >60 02/06/2020   Lipid Panel: No results found for: LDLCALC HgbA1c: No results found for: HGBA1C Urine Drug Screen: No results found for: LABOPIA, COCAINSCRNUR, LABBENZ, AMPHETMU, THCU, LABBARB  Alcohol Level No results found for: [facility]   CTH without contrast: CTH was negative for a large hypodensity concerning for a large territory infarct or hyperdensity concerning for an ICH  CT CEREBRAL PERFUSION W CONTRAST No core infarction. Large area of territory at risk throughout the right MCA territory as well as MCA/ACA and MCA/PCA watershed territories.  CT Angio Head and Neck: 1. 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 The right MCA and ACA branches are opacified. 2. 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 3. Multifocal moderate atherosclerotic stenoses, including the right A2 ACA, proximal right P2 PCA (in addition to severe distal stenosis), bilateral intradural vertebral arteries, and left vertebral artery origin. 4. 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 5. 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   Impression   Colbin Jovel is a 79 y.o. male  with PMH significant for afibb on warfarin, known carotid stenosis who presents with acute onset left sided weakness and found to have occluded R cervical ICA upto the siphon. His NIHSS and exam fluctuated every few minutes and he was noted initially to have a left arm drift with hand  weakness, left leg drift with left neglect and left hemianopsia with resolution of his symptoms at other times. We did consider tPA initially and even discussed with patient and family. However, this was held off after resolution of symptoms.  Workup with CT Angio demonstrated R ICA cervical occlusion with reconstitution at the carotid siphon. Given his fluctuating exam and worry over resultant debilitating symptoms and the noted large territory at risk with no core, we decided to offer emergent stenting of the R ICA.  Recommendations  - Plan:  I ordered following: - Frequent Neuro checks per stroke unit protocol - MRI Brain without contrast - repeat CT Head in AM. - TTE - Lipid panel with LDL - Will start statin if LDL > 70 - HbA1c - Antithrombotic - Will do aspirin tonight, will discuss timing of resuming anticoagulation after imaging to assess the territory of stroke. - I ordered DVT ppx - SBP goal < 180, permissive HTN and hold home AntiHTN medications. - Head of bed flat with strict bed rest. - Telemetry monitoring for arrythmia - Bedside swallow screen prior to PO intake. - Stroke education booklet - PT/OT/SLP consult  Afibb: On warfarin at home. Subtherapeutic INR at presentation. - Will hold off on [facility] until repeat imaging in AM. Time to resume will be based on the size of the infarct.  ______________________________________________________________________  This patient is critically ill and at significant risk of neurological worsening, death and care requires constant monitoring of vital signs, hemodynamics,respiratory and cardiac monitoring, neurological assessment, discussion with family, other specialists and medical decision making of high complexity. I spent 120 minutes of neurocritical care time  in the care of  this patient. This was time spent independent of any time provided by nurse practitioner or PA.  Alireza Pollack  Derry LoryKhaliqdina [facility] Pager Number  [phone] 05/18/19  8:38 PM   Thank you for the opportunity to take part in the care of this patient. If you have any further questions, please contact the neurology consultation attending.  Signed,  Erick BlinksSalman Woodfin Kiss [facility] Pager Number [phone]

## 2020-02-29 NOTE — ED Provider Notes (Addendum)
[facility]   CSN: [id number] Arrival date & time: 02/12/2020  1312  An emergency department physician performed an initial assessment on this suspected stroke patient at 1314.  History Chief Complaint  Patient presents with  . Code Stroke    Richard CanadaHugh Farmer is a 79 y.o. male.  HPI   79 year old man presenting emergency department today for evaluation of a code stroke.  Patient's last known well was around 1130 this morning.  He states that at 1030 this morning he had some trouble walking to the bathroom but was not exactly sure why.  His family noted that he was weak on the left side so called EMS.  Patient denies any other systemic symptoms at this time.  History limited due to acuity of condition.  History reviewed. No pertinent past medical history.  There are no problems to display for this patient.   History reviewed. No pertinent surgical history.     History reviewed. No pertinent family history.  Social History   Tobacco Use  . Smoking status: Not on file  Substance Use Topics  . Alcohol use: Not on file  . Drug use: Not on file    Home Medications Prior to Admission medications   Not on File    Allergies    Patient has no allergy information on record.  Review of Systems   Review of Systems  Constitutional: Negative for chills and fever.  HENT: Negative for ear pain and sore throat.   Eyes: Positive for visual disturbance.  Respiratory: Negative for cough and shortness of breath.   Cardiovascular: Negative for chest pain.  Gastrointestinal: Negative for abdominal pain, constipation, diarrhea, nausea and vomiting.  Genitourinary: Negative for dysuria and hematuria.  Musculoskeletal: Negative for back pain.  Skin: Negative for color change and rash.  Neurological: Positive for weakness and numbness.  All other systems reviewed and are negative.   Physical Exam Updated Vital Signs BP (!) 153/97    Pulse 74   Resp (!) 21   Ht 5\' 10"  (1.778 m)   Wt 87 kg   SpO2 100%   BMI 27.52 kg/m   Physical Exam Vitals and nursing note reviewed.  Constitutional:      Appearance: He is well-developed.  HENT:     Head: Normocephalic.  Eyes:     Conjunctiva/sclera: Conjunctivae normal.  Cardiovascular:     Rate and Rhythm: Normal rate and regular rhythm.  Pulmonary:     Effort: Pulmonary effort is normal. No respiratory distress.     Breath sounds: Normal breath sounds.  Abdominal:     General: Abdomen is flat.     Palpations: Abdomen is soft.     Tenderness: There is no abdominal tenderness.  Musculoskeletal:     Cervical back: Neck supple.  Skin:    General: Skin is warm and dry.  Neurological:     Mental Status: He is alert.     Comments: Mental Status:  Alert, thought content appropriate, able to give a coherent history. Speech fluent without evidence of aphasia. Able to follow 2 step commands without difficulty.  Cranial Nerves:  II:  pupils equal, round, reactive to light III,IV, VI: ptosis not present, extra-ocular motions intact bilaterally  V,VII: smile symmetric, facial light touch sensation equal VIII: hearing grossly normal to voice  X: uvula elevates symmetrically  XI: bilateral shoulder shrug symmetric and strong XII: midline tongue extension without fassiculations Motor:  Normal tone. Slightly decreased strength to the  LUE/LLE Sensory: decreased strength to the LUE/LLE Cerebellar: normal finger-to-nose with bilateral upper extremities, normal heel to shin +pronator drift the of the LUE/LLE     ED Results / Procedures / Treatments   Labs (all labs ordered are listed, but only abnormal results are displayed) Labs Reviewed  PROTIME-INR - Abnormal; Notable for the following components:      Result Value   Prothrombin Time 16.9 (*)    INR 1.4 (*)    All other components within normal limits  CBC - Abnormal; Notable for the following components:   RBC 4.19 (*)     MCV 100.5 (*)    All other components within normal limits  COMPREHENSIVE METABOLIC PANEL - Abnormal; Notable for the following components:   Glucose, Bld 176 (*)    BUN 26 (*)    Total Protein 6.3 (*)    Albumin 3.4 (*)    Total Bilirubin 1.3 (*)    All other components within normal limits  I-STAT CHEM 8, ED - Abnormal; Notable for the following components:   BUN 29 (*)    Glucose, Bld 163 (*)    Calcium, Ion 1.13 (*)    All other components within normal limits  CBG MONITORING, ED - Abnormal; Notable for the following components:   Glucose-Capillary 167 (*)    All other components within normal limits  RESPIRATORY PANEL BY RT PCR (FLU A&B, COVID)  APTT  DIFFERENTIAL    EKG EKG Interpretation  Date/Time:  Wednesday 12-Mar-2020 13:58:56 EST Ventricular Rate:  78 PR Interval:    QRS Duration: 108 QT Interval:  388 QTC Calculation: 442 R Axis:   94 Text Interpretation: Right and left arm electrode reversal, interpretation assumes no reversal Atrial fibrillation Probable lateral infarct, age indeterminate No old tracing to compare Confirmed by Derwood Kaplan [phone]) on 03/30/2020 3:03:53 PM   Radiology CT CEREBRAL PERFUSION W CONTRAST  Result Date: 12-Mar-2020 CLINICAL DATA:  Left-sided weakness EXAM: CT PERFUSION BRAIN TECHNIQUE: Multiphase CT imaging of the brain was performed following IV bolus contrast injection. Subsequent parametric perfusion maps were calculated using RAPID software. CONTRAST:  75mL OMNIPAQUE IOHEXOL 350 MG/ML SOLN COMPARISON:  None. FINDINGS: CT Brain Perfusion Findings: CBF (<30%) Volume: 59mL Perfusion (Tmax>6.0s) volume: Mismatch Volume: 06/01/2046mL ASPECTS on noncontrast CT Head: 10 at 1:19 p.m. today. Infarct Core: 0 mL Infarction Location: Right MCA territory and watershed territories. IMPRESSION: No core infarction. Large area of territory at risk throughout the right MCA territory as well as MCA/ACA and MCA/PCA watershed territories.  Electronically Signed   By: Guadlupe Spanish M.D.   On: 03/11/2020 13:55   CT HEAD CODE STROKE WO CONTRAST  Result Date: 03/11/2020 CLINICAL DATA:  Code stroke. 79 year old male with left side weakness. EXAM: CT HEAD WITHOUT CONTRAST TECHNIQUE: Contiguous axial images were obtained from the base of the skull through the vertex without intravenous contrast. COMPARISON:  None. FINDINGS: Brain: No midline shift, mass effect, or evidence of intracranial mass lesion. No acute intracranial hemorrhage identified. No ventriculomegaly. Patchy mild to moderate for age bilateral cerebral white matter hypodensity. No cortically based acute infarct identified. Vascular: Calcified atherosclerosis at the skull base. No suspicious intracranial vascular hyperdensity. Skull: Degenerative changes in the visible upper cervical spine. No acute osseous abnormality identified. Sinuses/Orbits: Visualized paranasal sinuses and mastoids are stable and well pneumatized. Other: There is some rightward gaze. No other acute orbit finding. Possible right forehead scalp hematoma or contusion on series 4, image 49. Underlying calvarium intact. ASPECTS (  [place] Stroke Program Early CT Score) - Ganglionic level infarction (caudate, lentiform nuclei, internal capsule, insula, M1-M3 cortex): - Supraganglionic infarction (M4-M6 cortex): Total score (0-10 with 10 being normal): IMPRESSION: 1. No acute cortically based infarct or acute intracranial hemorrhage identified. ASPECTS 10. Mild to moderate for age cerebral white matter changes. 2. Possible right forehead scalp hematoma or contusion. No underlying skull fracture. 3. These results were communicated to Dr.Khaliqdina at 1:29 pm on 02/24/2020 by text page via the [facility] messaging system. Electronically Signed   By: Odessa Fleming M.D.   On: 02/16/2020 13:29   CT ANGIO HEAD CODE STROKE  Result Date: 02/10/2020 CLINICAL DATA:  Stroke/TIA.  Left-sided weakness. EXAM: CT ANGIOGRAPHY HEAD AND NECK  TECHNIQUE: Multidetector CT imaging of the head and neck was performed using the standard protocol during bolus administration of intravenous contrast. Multiplanar CT image reconstructions and MIPs were obtained to evaluate the vascular anatomy. Carotid stenosis measurements (when applicable) are obtained utilizing NASCET criteria, using the distal internal carotid diameter as the denominator. CONTRAST:  2mL OMNIPAQUE IOHEXOL 350 MG/ML SOLN COMPARISON:  Same day CT head. 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 Right carotid system: There is calcific and noncalcific atherosclerosis at the carotid bifurcation. 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 Non opacification of the remainder of the ICA in the neck. Left carotid system: Calcific atherosclerosis of the common carotid artery. There is calcified and noncalcified atherosclerosis at the bifurcation with a web (see series 6, image 225), but without greater than 50% narrowing. Vertebral arteries: Mildly left dominant. Moderate stenosis of the left vertebral artery origin. Otherwise, no hemodynamically significant stenosis in the neck. 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 Upper chest: No acute abnormality. Review of the MIP images confirms the above findings CTA HEAD FINDINGS Anterior circulation: Occlusion of the right internal carotid artery to the level of the carotid terminus. The right MCA and ACA branches are well opacified proximally. There is moderate stenosis of the right A2 ACA, likely related to atherosclerosis. Posterior circulation: Moderate stenosis of bilateral intradural vertebral arteries secondary to calcific atherosclerosis. Left fetal type PCA. Moderate stenosis of the proximal right P2 PCA with additional severe stenosis of the more distal P2 PCA. Venous  sinuses: As permitted by contrast timing, patent. Review of the MIP images confirms the above findings IMPRESSION: 1. 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 The right MCA and ACA branches are opacified. 2. 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 3. Multifocal moderate atherosclerotic stenoses, including the right A2 ACA, proximal right P2 PCA (in addition to severe distal stenosis), bilateral intradural vertebral arteries, and left vertebral artery origin. 4. 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 5. 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 Conclusion #1 was discussed with Dr. Derry Lory at 1:42 PM via telephone. Electronically Signed   By: Feliberto Harts MD   On: 02/17/2020 14:02   CT ANGIO NECK CODE STROKE  Result Date: 02/12/2020 CLINICAL DATA:  Stroke/TIA.  Left-sided weakness. EXAM: CT ANGIOGRAPHY HEAD AND NECK TECHNIQUE: Multidetector CT imaging of the head and neck was performed using the standard protocol during bolus administration of intravenous contrast. Multiplanar CT image reconstructions and MIPs were obtained to evaluate the  vascular anatomy. Carotid stenosis measurements (when applicable) are obtained utilizing NASCET criteria, using the distal internal carotid diameter as the denominator. CONTRAST:  75mL OMNIPAQUE IOHEXOL 350 MG/ML SOLN COMPARISON:  Same day CT head. 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 Right carotid system: There is calcific and noncalcific atherosclerosis at the carotid bifurcation. 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 Non opacification of the remainder of the ICA in the neck. Left carotid system: Calcific atherosclerosis of the common carotid artery. There is calcified and  noncalcified atherosclerosis at the bifurcation with a web (see series 6, image 225), but without greater than 50% narrowing. Vertebral arteries: Mildly left dominant. Moderate stenosis of the left vertebral artery origin. Otherwise, no hemodynamically significant stenosis in the neck. 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 Upper chest: No acute abnormality. Review of the MIP images confirms the above findings CTA HEAD FINDINGS Anterior circulation: Occlusion of the right internal carotid artery to the level of the carotid terminus. The right MCA and ACA branches are well opacified proximally. There is moderate stenosis of the right A2 ACA, likely related to atherosclerosis. Posterior circulation: Moderate stenosis of bilateral intradural vertebral arteries secondary to calcific atherosclerosis. Left fetal type PCA. Moderate stenosis of the proximal right P2 PCA with additional severe stenosis of the more distal P2 PCA. Venous sinuses: As permitted by contrast timing, patent. Review of the MIP images confirms the above findings IMPRESSION: 1. 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 The right MCA and ACA branches are opacified. 2. 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 3. Multifocal moderate atherosclerotic stenoses, including the right A2 ACA, proximal right P2 PCA (in addition to severe distal stenosis), bilateral intradural vertebral arteries, and left vertebral artery origin. 4. 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 5. 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 Conclusion #1 was discussed with Dr. Derry Lory at 1:42 PM via telephone. Electronically Signed   By: Feliberto Harts MD   On: March 20, 2020 14:02    Procedures Procedures  (including critical care time)  CRITICAL CARE Performed by: Karrie Meres   Total critical care time: 32 minutes  Critical care time was exclusive of separately billable procedures and treating other patients.  Critical care was necessary to treat or prevent imminent or life-threatening deterioration.  Critical care was time spent personally by me on the following activities: development of treatment plan with patient and/or surrogate as well as nursing, discussions with consultants, evaluation of patient's response to treatment, examination of patient, obtaining history from patient or surrogate, ordering and performing treatments and interventions, ordering and review of laboratory studies, ordering and review of radiographic studies, pulse oximetry and re-evaluation of patient's condition.   Medications Ordered in ED Medications  sodium chloride flush (NS) 0.9 % injection 3 mL (has no administration in time range)  iohexol (OMNIPAQUE) 240 MG/ML injection (has no administration in time range)  aspirin 81 MG chewable tablet (has no administration in time range)  verapamil (ISOPTIN) 2.5 MG/ML injection (has no administration in time range)  ticagrelor (BRILINTA) 90 MG tablet (has no administration in time range)  clopidogrel (PLAVIX) 300 MG tablet (has no administration in time range)  cangrelor (KENGREAL) 50 MG SOLR (has no  administration in time range)  tirofiban (AGGRASTAT) 5-0.9 MG/100ML-% injection (has no administration in time range)  eptifibatide (INTEGRILIN) 20 MG/10ML injection (has no administration in time range)  iohexol (OMNIPAQUE) 350 MG/ML injection 75 mL (75 mLs Intravenous Contrast Given 02/28/2020 1333)  iohexol (OMNIPAQUE) 350 MG/ML injection 40 mL (40 mLs Intravenous Contrast Given 02/26/2020 1346)  fentaNYL citrate (PF) (SUBLIMAZE) 250 MCG/5ML injection (  Override pull for Anesthesia 02/09/2020 1523)    ED Course  I have reviewed the triage vital signs and the  nursing notes.  Pertinent labs & imaging results that were available during my care of the patient were reviewed by me and considered in my medical decision making (see chart for details).    MDM Rules/Calculators/A&P                          79 year old male presenting emergency department today as a code stroke.  Reportedly had left-sided weakness and last known well was 11:30 AM this morning.  On neurology's evaluation he had some visual changes that seem to resolve during his evaluation.  CT head without evidence of ICH.  CTA head/neck with occlusion of the right cervical ICA and opacification of the right MCA and ACA branches.  Also with critical stenosis of the brachiocephalic artery and severe stenosis of the right P2 PCA. Multifocal moderate atherosclerotic stenoses, including the right A2 ACA, proximal right P2 PCA (in addition to severe distal stenosis), bilateral intradural vertebral arteries, and left vertebral artery origin. 4. Bilateral carotid bifurcation atherosclerosis with a web on the Left.  Neurology initially wanted to give TPA however patient's symptoms were resolving with repeat evaluation so case was discussed with neuro IR who ultimately decided to take the patient for thrombectomy.  Patient taken to the IR suite.  Final Clinical Impression(s) / ED Diagnoses Final diagnoses:  Stroke [facility])    Rx / DC Orders ED Discharge Orders    None       Karrie Meres, PA-C 02/20/2020 1530    Mariane Burpee S, PA-C 02/28/2020 1530    Derwood Kaplan, MD 03/02/20 [phone]

## 2020-02-29 NOTE — ED Triage Notes (Signed)
Pt arrives via EMS from home with complaints of stroke like symptoms. Pt reports getting up around 1030 02/12/2020 and had trouble getting to the bathroom. Reports left sided weakness. Pt taking warfarin with last dose being 02/28/2020 evening time.   CBG 162 139/92 HR 85 97% RA 87 kg actual weight.

## 2020-02-29 NOTE — Progress Notes (Signed)
eLink Physician-Brief Progress Note Patient Name: Richard Farmer DOB: 14-Aug-1940 MRN: [id number]   Date of Service  02/25/2020  HPI/Events of Note  89 M history of hypertension, afib on warfarin, presented with left sided weakness. Head CT without bleed. Went to IR and noted to have right ICA occlusion with unsuccessful revascularization. EBL 500 cc. Access right femoral artery without hematoma.  eICU Interventions  Patient remains intubated. Permissive HTN SBP 180 and will remain flat on bed as per neurology recommendation. 2 unit PRBC transfusion already ordered. Monitor for further bleed. Neuro checks. Imaging as ordered. Bedside CCM team to see patient.     Intervention Category Evaluation Type: New Patient Evaluation  Darl Pikes 02/13/2020, 8:00 PM

## 2020-02-29 NOTE — Anesthesia Procedure Notes (Signed)
Procedure Name: Intubation Date/Time: 02/15/2020 3:06 PM Performed by: Tressia Miners, CRNA Pre-anesthesia Checklist: Patient identified, Emergency Drugs available, Suction available and Patient being monitored Patient Re-evaluated:Patient Re-evaluated prior to induction Oxygen Delivery Method: Circle System Utilized Preoxygenation: Pre-oxygenation with 100% oxygen Induction Type: IV induction Ventilation: Unable to mask ventilate Laryngoscope Size: Glidescope and 4 Grade View: Grade I Tube type: Oral Tube size: 7.5 mm Number of attempts: 1 Airway Equipment and Method: Stylet Placement Confirmation: ETT inserted through vocal cords under direct vision,  positive ETCO2 and breath sounds checked- equal and bilateral Secured at: 23 cm Tube secured with: Tape Dental Injury: Teeth and Oropharynx as per pre-operative assessment  Comments: Pt COVID test has not resulted yet, RSI performed with glidescope.

## 2020-02-29 NOTE — Progress Notes (Signed)
NeuroInterventional Radiology  Pre-Procedure Note  History: 79 yo male presents with acute left sided symptoms, with right ICA occlusion.  Waxing waning physical exam, worsening in the upright position, improved in the supine position.   History of left CEA, with CT changes of chronic dissection.   Baseline mRS: 1  CT ASPECTS: 10 CTA:   Right ICA occlusion, corresponding to the symptoms CTP:   146cc at risk, 0 core   I have discussed the case with Dr. Derry Lory and Dr. Pearlean Brownie of Stroke Neurology.  Given the patient's symptoms, imaging findings, baseline function, we agree that the treatment of attempt of acute right carotid revascularization will be attempt to save the 146cc at risk, or which we believe he is very high risk of completing the infarction given his consistently fluctuating exam.  This would include a right ophthalmic artery occlusion/vision symptoms, and potentially a fatal stroke of right hemisphere.   We have discussed the options as possible revascularization candicate targeting the right carotid occlusion. they are an appropriate candidate for attempt for mechanical thrombectomy.    The risks and benefits of the procedure were discussed with the patient and the patient's family.  Specific risks including: bleeding, infection, arterial injury/dissection, contrast reaction, kidney injury, need for further procedure/surgery, embolism to new territory (ENT), neurologic deficit, 10-15% risk of intracranial hemorrhage, cardiopulmonary collapse, death. All questions were answered.  The patient/family are insistent that they would like to be aggressive with treatment given his risk of medical care only, and he would like to proceed with attempt at emergent revascularization.   Plan for cerebral angiogram and attempt at emergent revascularization right ICA.   Signed,   Yvone Neu. Loreta Ave, DO

## 2020-02-29 NOTE — Anesthesia Procedure Notes (Signed)
Arterial Line Insertion Start/End11/10/2019 3:15 PM, 02/29/2020 3:20 PM Performed by: Gaynelle Adu, MD, Macie Burows, CRNA, CRNA  Patient location: Pre-op. Preanesthetic checklist: patient identified, IV checked, site marked, risks and benefits discussed, surgical consent, monitors and equipment checked, pre-op evaluation, timeout performed and anesthesia consent Lidocaine 1% used for infiltration Left, radial was placed Catheter size: 20 G Hand hygiene performed  and maximum sterile barriers used   Attempts: 1 Procedure performed without using ultrasound guided technique. Following insertion, dressing applied and Biopatch. Post procedure assessment: normal and unchanged

## 2020-02-29 NOTE — Consult Note (Addendum)
NAME:  Richard Farmer, MRN:  [id number], DOB:  [birth year], LOS: 0 ADMISSION DATE:  02/25/2020, CONSULTATION DATE:  02/12/2020 REFERRING MD:  Loreta Ave, CHIEF COMPLAINT:   L-sided weakness, CVA  Brief History   79 y.o. M of atrial fibrillation on coumadin, CAD, carotid stenosis, DM, HL and HTN who presented with L-sided weakness and found to have right ICA cervical occlusion s/p stenting right ICA and unsuccessful thrombectomy of right MCA/M1 and intracranial ICA.  Left intubated post procedure and PCCM consulted for vent management  History of present illness   79 y.o. M of atrial fibrillation on coumadin, CAD, carotid stenosis, DM, HL and HTN who presented with difficulty walking and L-sided weakness and found to have right ICA cervical occlusion.  He is on Coumadin, so did not receive TPA and was taken to IR for emergent stenting right ICA and unsuccessful thrombectomy of right MCA/M1 and intracranial ICA. H Left intubated post procedure and PCCM consulted for vent management  Past Medical History   has a past medical history of Atrial fibrillation (HCC), CAD (coronary artery disease), Carotid artery disease (HCC), Diabetes mellitus (HCC), Gout, Hyperlipidemia, and Hypertension.   Significant Hospital Events   11/24 Admit to Neurology  Consults:  PCCM  Procedures:  11/24 ETT  Significant Diagnostic Tests:  11/24 CTA head>> Age indeterminate occlusion of the right cervical ICA at its origin with reconstitution at the carotid terminus. The right MCA and ACA branches are opacified. 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  Multifocal moderate atherosclerotic stenoses, including the right A2 ACA, proximal right P2 PCA (in addition to severe distal stenosis), bilateral intradural vertebral arteries, and left vertebral artery origin.  11/24 CXR>> no acute infiltrate, ETT in good position  Micro Data:  11/24 Covid-19 and  Influenza>>negative  Antimicrobials:     Interim history/subjective:  Transferred to critical care unit intubated and sedated, but in stable condidtion  Objective   Blood pressure (!) 172/116, pulse 78, resp. rate (!) 21, height 5\' 10"  (1.778 m), weight 87 kg, SpO2 100 %.        Intake/Output Summary (Last 24 hours) at 02/21/2020 1950 Last data filed at 03/01/2020 1929 Gross per 24 hour  Intake 3130 ml  Output 1300 ml  Net 1830 ml   Filed Weights   02/09/2020 1300 02/15/2020 1403  Weight: 87 kg 87 kg    General: Elderly male, intubated and sedated HEENT: MM pink/moist, pupils equal and reactive, ETT in place Neuro: Examined on propofol, unresponsive, triggering breaths on the vent, pupils reactive, not withdrawing to pain CV: s1s2 RRR, no m/r/g PULM: Mechanical breath sounds on 60% FiO2 and PEEP of 5 GI: soft, bsx4 active  Extremities: warm/dry,  Edema, right femoral sheath site dressed with no bleeding Skin: no rashes or lesions   Resolved Hospital Problem list     Assessment & Plan:   Acute CVA, right ICA and MCA occlusion with stenting but unsuccessful thrombectomy To remain intubated overnight per IR P: -Plan per neurology and IR, right hip straight -Permissive hypertension up to 180 systolic -Serial neuro checks -Repeat CT in 36 hours -Lipid panel, on cangrelor tonight and resume anticoagulation at the discretion of neurology -Maintain full vent support with SAT/SBT as tolerated -titrate Vent setting to maintain SpO2 greater than or equal to 90%. -HOB elevated 30 degrees. -Plateau pressures less than 30 cm H20.  -Follow chest x-ray, ABG prn.   -Bronchial hygiene and RT/bronchodilator protocol. -Sedation with propofol  and Versed -On minimal vent settings, follow-up ABG   Atrial fibrillation Rate controlled currently, on Coumadin at home with sub- therapeutic INR on presentation P: -Resume anticoagulation per neurology, monitor for RVR -echo  pending  Hypokalemia -Currently being replaced, check magnesium level  Best practice (evaluated daily)   Diet: N.p.o.  pain/Anxiety/Delirium protocol (if indicated): Propofol VAP protocol (if indicated): HOB 30 degrees, suction as needed DVT prophylaxis: SCDs GI prophylaxis: Protonix Glucose control: SSI Mobility: Bedrest last date of multidisciplinary goals of care discussion per primary team Family and staff present  Summary of discussion  Follow up goals of care discussion due Code Status: Full code Disposition: ICU  Labs   CBC: Recent Labs  Lab 02/26/2020 1317 02/23/2020 1321 02/12/2020 1832  WBC 6.3  --   --   NEUTROABS 4.2  --   --   HGB 13.6 14.3 7.8*  HCT 42.1 42.0 23.0*  MCV 100.5*  --   --   PLT 161  --   --     Basic Metabolic Panel: Recent Labs  Lab 03/01/2020 1317 02/22/2020 1321 02/16/2020 1832  NA 140 140 147*  K 3.7 3.6 2.4*  CL 103 102 114*  CO2 29  --   --   GLUCOSE 176* 163* 90  BUN 26* 29* 15  CREATININE 1.06 1.00 0.30*  CALCIUM 9.3  --   --    GFR: Estimated Creatinine Clearance: 76 mL/min (A) (by C-G formula based on SCr of 0.3 mg/dL (L)). Recent Labs  Lab 03/05/2020 1317  WBC 6.3    Liver Function Tests: Recent Labs  Lab 03/03/2020 1317  AST 21  ALT 23  ALKPHOS 82  BILITOT 1.3*  PROT 6.3*  ALBUMIN 3.4*   No results for input(s): LIPASE, AMYLASE in the last 168 hours. No results for input(s): AMMONIA in the last 168 hours.  ABG    Component Value Date/Time   TCO2 17 (L) 02/13/2020 1832     Coagulation Profile: Recent Labs  Lab 02/21/2020 1317  INR 1.4*    Cardiac Enzymes: No results for input(s): CKTOTAL, CKMB, CKMBINDEX, TROPONINI in the last 168 hours.  HbA1C: No results found for: HGBA1C  CBG: Recent Labs  Lab 02/18/2020 1314  GLUCAP 167*    Review of Systems:   Unable to obtain  Past Medical History  He,  has no past medical history on file.   Surgical History   History reviewed. No pertinent surgical  history.   Social History      Family History   His family history is not on file.   Allergies Not on File   Home Medications  Prior to Admission medications   Not on File     Critical care time: 35 minutes       CRITICAL CARE Performed by: Darcella Gasman Raniya Golembeski   Total critical care time: 35 minutes  Critical care time was exclusive of separately billable procedures and treating other patients.  Critical care was necessary to treat or prevent imminent or life-threatening deterioration.  Critical care was time spent personally by me on the following activities: development of treatment plan with patient and/or surrogate as well as nursing, discussions with consultants, evaluation of patient's response to treatment, examination of patient, obtaining history from patient or surrogate, ordering and performing treatments and interventions, ordering and review of laboratory studies, ordering and review of radiographic studies, pulse oximetry and re-evaluation of patient's condition.   Darcella Gasman Tomasa Dobransky, PA-C [facility]  Pager# [phone], if  no answer [phone]

## 2020-02-29 NOTE — Sedation Documentation (Signed)
Report given to [facility] RN (IR).  IR RN care of patient transferred to [facility]

## 2020-02-29 NOTE — Code Documentation (Signed)
Stroke Response Nurse Documentation Code Documentation  Richard Farmer is a 79 y.o. male arriving to [facility] ED via [facility] on 03-13-20 with past medical hx of cardiac stenting, AFib, HTN, Diabetes, and HLP. Code stroke was activated by EMS prior to arrival. Patient from home where he was LKW at 1200 when his family noted that he started having left sided weakness. Family called 911 and the patient was transported. On warfarin daily.   Stroke team at the bedside on patient arrival. Labs drawn and patient cleared for CT by Dr. Rhunette Croft. Patient to CT with team. NIHSS 4, see documentation for details and code stroke times. Patient with left arm weakness, left leg weakness, left decreased sensation and Sensory  neglect on exam. The following imaging was completed:  CT, CTA head and neck, CTP. Patient resolving while on the CT scanner. Taken back to the ED and placed on the cardiac monitor. Patient is not a candidate for tPA due to too mild to treat.   After sitting patient up to take off his shirt, pt was noted to have visual field deficit and return of some weakness. Pt laid back down and symptoms resolved. NIHSS 1 with very slight left arm weakness.    CTA showed a right ICA occlusion. CTP had no core with mismatch of 146 mL. After long discussion with family and patient, decision made to proceed with IR to open occlusion with risk of worsening high due to waxing and waning symptoms.   Lucila [place]  Stroke Response RN

## 2020-03-01 ENCOUNTER — Inpatient Hospital Stay (HOSPITAL_COMMUNITY): Payer: Medicare Other

## 2020-03-01 DIAGNOSIS — I6389 Other cerebral infarction: Secondary | ICD-10-CM | POA: Diagnosis not present

## 2020-03-01 DIAGNOSIS — I361 Nonrheumatic tricuspid (valve) insufficiency: Secondary | ICD-10-CM

## 2020-03-01 DIAGNOSIS — I350 Nonrheumatic aortic (valve) stenosis: Secondary | ICD-10-CM | POA: Diagnosis not present

## 2020-03-01 DIAGNOSIS — I63511 Cerebral infarction due to unspecified occlusion or stenosis of right middle cerebral artery: Secondary | ICD-10-CM

## 2020-03-01 LAB — PROTIME-INR
INR: 1.5 — ABNORMAL HIGH (ref 0.8–1.2)
Prothrombin Time: 17.1 seconds — ABNORMAL HIGH (ref 11.4–15.2)

## 2020-03-01 LAB — COMPREHENSIVE METABOLIC PANEL
ALT: 19 U/L (ref 0–44)
AST: 17 U/L (ref 15–41)
Albumin: 3.1 g/dL — ABNORMAL LOW (ref 3.5–5.0)
Alkaline Phosphatase: 67 U/L (ref 38–126)
Anion gap: 10 (ref 5–15)
BUN: 26 mg/dL — ABNORMAL HIGH (ref 8–23)
CO2: 20 mmol/L — ABNORMAL LOW (ref 22–32)
Calcium: 8.5 mg/dL — ABNORMAL LOW (ref 8.9–10.3)
Chloride: 109 mmol/L (ref 98–111)
Creatinine, Ser: 1.03 mg/dL (ref 0.61–1.24)
GFR, Estimated: >60 mL/min (ref >60)
Glucose, Bld: 166 mg/dL — ABNORMAL HIGH (ref 70–99)
Potassium: 3.7 mmol/L (ref 3.5–5.1)
Sodium: 139 mmol/L (ref 135–145)
Total Bilirubin: 1.4 mg/dL — ABNORMAL HIGH (ref 0.3–1.2)
Total Protein: 5.1 g/dL — ABNORMAL LOW (ref 6.5–8.1)

## 2020-03-01 LAB — CBC
HCT: 38.3 % — ABNORMAL LOW (ref 39.0–52.0)
Hemoglobin: 12.6 g/dL — ABNORMAL LOW (ref 13.0–17.0)
MCH: 31.7 pg (ref 26.0–34.0)
MCHC: 32.9 g/dL (ref 30.0–36.0)
MCV: 96.2 fL (ref 80.0–100.0)
Platelets: 128 10*3/uL — ABNORMAL LOW (ref 150–400)
RBC: 3.98 MIL/uL — ABNORMAL LOW (ref 4.22–5.81)
RDW: 14.6 % (ref 11.5–15.5)
WBC: 7.9 10*3/uL (ref 4.0–10.5)
nRBC: 0 % (ref 0.0–0.2)

## 2020-03-01 LAB — HEMOGLOBIN A1C
Hgb A1c MFr Bld: 6.3 % — ABNORMAL HIGH (ref 4.8–5.6)
Mean Plasma Glucose: 134.11 mg/dL

## 2020-03-01 LAB — BPAM RBC
Blood Product Expiration Date: 202112202359
Blood Product Expiration Date: 202112202359
ISSUE DATE / TIME: 202111241843
ISSUE DATE / TIME: 202111241843
Unit Type and Rh: 6200
Unit Type and Rh: 6200

## 2020-03-01 LAB — ECHOCARDIOGRAM COMPLETE
AR max vel: 0.85 cm2
AV Area VTI: 0.91 cm2
AV Area mean vel: 0.89 cm2
AV Mean grad: 15.6 mmHg
AV Peak grad: 26.3 mmHg
Ao pk vel: 2.56 m/s
Height: 66 in
S' Lateral: 1.8 cm
Weight: 3068.8 oz

## 2020-03-01 LAB — TYPE AND SCREEN
ABO/RH(D): A POS
Antibody Screen: NEGATIVE
Unit division: 0
Unit division: 0

## 2020-03-01 LAB — GLUCOSE, CAPILLARY
Glucose-Capillary: 166 mg/dL — ABNORMAL HIGH (ref 70–99)
Glucose-Capillary: 155 mg/dL — ABNORMAL HIGH (ref 70–99)
Glucose-Capillary: 139 mg/dL — ABNORMAL HIGH (ref 70–99)
Glucose-Capillary: 102 mg/dL — ABNORMAL HIGH (ref 70–99)
Glucose-Capillary: 87 mg/dL (ref 70–99)
Glucose-Capillary: 81 mg/dL (ref 70–99)

## 2020-03-01 LAB — MAGNESIUM: Magnesium: 1.6 mg/dL — ABNORMAL LOW (ref 1.7–2.4)

## 2020-03-01 LAB — SODIUM
Sodium: 139 mmol/L (ref 135–145)
Sodium: 141 mmol/L (ref 135–145)

## 2020-03-01 LAB — TRIGLYCERIDES: Triglycerides: 142 mg/dL (ref <150)

## 2020-03-01 MED ORDER — DEXMEDETOMIDINE HCL IN NACL 400 MCG/100ML IV SOLN
0.4000 ug/kg/h | INTRAVENOUS | Status: DC
Start: 2020-03-01 — End: 2020-03-01

## 2020-03-01 MED ORDER — ASPIRIN 325 MG PO TABS
325.0000 mg | ORAL_TABLET | Freq: Every day | ORAL | Status: DC
  Administered 2020-03-01 – 2020-03-10 (×10): 325 mg
  Filled 2020-03-01 (×11): qty 1

## 2020-03-01 MED ORDER — ASPIRIN 300 MG RE SUPP: 300.0000 mg | Freq: Every day | RECTAL | Status: DC

## 2020-03-01 MED ORDER — PANTOPRAZOLE SODIUM 40 MG PO PACK
40.0000 mg | PACK | Freq: Every day | ORAL | Status: DC
  Administered 2020-03-02 – 2020-03-10 (×9): 40 mg
  Filled 2020-03-01 (×9): qty 20

## 2020-03-01 MED ORDER — MAGNESIUM SULFATE 4 GM/100ML IV SOLN
4.0000 g | Freq: Once | INTRAVENOUS | Status: AC
  Administered 2020-03-01: 4 g via INTRAVENOUS
  Filled 2020-03-01 (×2): qty 100

## 2020-03-01 MED ORDER — LACTATED RINGERS IV SOLN: INTRAVENOUS | Status: DC

## 2020-03-01 MED ORDER — CLOPIDOGREL BISULFATE 75 MG PO TABS
75.0000 mg | ORAL_TABLET | Freq: Every day | ORAL | Status: DC
  Administered 2020-03-01 – 2020-03-10 (×10): 75 mg
  Filled 2020-03-01 (×11): qty 1

## 2020-03-01 MED ORDER — SODIUM CHLORIDE 3 % IV SOLN
INTRAVENOUS | Status: DC
  Filled 2020-03-01 (×7): qty 500

## 2020-03-01 MED ORDER — SODIUM CHLORIDE 0.9 % IV SOLN: INTRAVENOUS | Status: DC

## 2020-03-01 NOTE — Progress Notes (Signed)
Pt transported to and from 4N 15 to MRI on ventilator. Pt stable throughout with no complications

## 2020-03-01 NOTE — Progress Notes (Signed)
NAME:  Richard Farmer, MRN:  [id number], DOB:  [birth year], LOS: 1 ADMISSION DATE:  02/16/2020, CONSULTATION DATE:  03/01/20 REFERRING MD:  Loreta Ave, CHIEF COMPLAINT:   L-sided weakness, CVA  Brief History   79 y.o. M of atrial fibrillation on coumadin, CAD, carotid stenosis, DM, HL and HTN who presented with L-sided weakness and found to have right ICA cervical occlusion s/p stenting right ICA and unsuccessful thrombectomy of right MCA/M1 and intracranial ICA.  Left intubated post procedure   Past Medical History   has a past medical history of Atrial fibrillation (HCC), CAD (coronary artery disease), Carotid artery disease (HCC), Diabetes mellitus (HCC), Gout, Hyperlipidemia, and Hypertension.   Significant Hospital Events   11/24 Admit to Neurology  Consults:  PCCM  Procedures:  11/24 ETT  Significant Diagnostic Tests:  11/24 CTA head>> Age indeterminate occlusion of the right cervical ICA at its origin with reconstitution at the carotid terminus. The right MCA and ACA branches are opacified. 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  Multifocal moderate atherosclerotic stenoses, including the right A2 ACA, proximal right P2 PCA (in addition to severe distal stenosis), bilateral intradural vertebral arteries, and left vertebral artery origin.  11/24 CXR>> no acute infiltrate, ETT in good position  11/25 CT head : Acute right MCA territory infarct. Mild cerebral atrophy. Mild pansinus disease. Trace layering secretions may reflect. active inflammation.  Micro Data:  11/24 Covid-19 and Influenza>>negative  Antimicrobials:     Interim history/subjective:  Patient remained on ventilator, on minimal sedation, following commands.  Repeat CT head showed extensive right MCA territory infarct  Objective   Blood pressure 129/79, pulse 88, temperature 97.8 F (36.6 C), temperature source Axillary, resp. rate 18, height 5\' 6"  (1.676 m), weight  87 kg, SpO2 98 %.    Vent Mode: PRVC FiO2 (%):  [40 %-60 %] 40 % Set Rate:  [15 bmp-16 bmp] 16 bmp Vt Set:  [510 mL] 510 mL PEEP:  [5 cmH20] 5 cmH20 Plateau Pressure:  [15 cmH20] 15 cmH20   Intake/Output Summary (Last 24 hours) at 03/01/2020 03/03/2020 Last data filed at 03/01/2020 0900 Gross per 24 hour  Intake 4579.38 ml  Output 1750 ml  Net 2829.38 ml   Filed Weights   02/16/2020 1300 02/09/2020 1403  Weight: 87 kg 87 kg    General: Elderly Caucasian male, lying on the bed, orally intubated HEENT: Atraumatic, normocephalic MM pink/moist, ETT in place Neuro: Eyes closed, following commands antigravity on right side, left lower extremity 2/5.  Left upper extremity 0/5 CV: s1s2 RRR, no m/r/g PULM: Coarse breath sounds, no wheezes or rhonchi  GI: soft, bsx4 active  Extremities: warm/dry,  Edema, right femoral sheath site dressed with no bleeding Skin: no rashes or lesions   Resolved Hospital Problem list     Assessment & Plan:  Acute right MCA territory stroke, due to right ICA/right M1 occlusion status post attempted thrombectomy and stent placement in the right ICA Continue neuro watch every hour Permissive hypertension per ischemic stroke guideline, treat if systolic >220 Repeat CT scan showed hypodensity in the right MCA territory Patient is scheduled for MRI brain and echocardiogram Continue aspirin and Brilinta Continue secondary stroke prophylaxis Continue LR at 75 cc an hour, after MRI if it shows cerebral edema, will start him on 3% saline  Acute respiratory failure due to stroke Continue mechanical ventilation, patient is tolerating pressure support trial Minimize sedation with RASS goal of 0/-1 Post MRI of patient  remained stable, will try to extubate him  Chronic atrial fibrillation Patient's heart rate is well controlled He was on Coumadin at home, which is kept on hold for now We will defer anticoagulation resumption  Hypokalemia/hypomagnesemia Continue  supplement electrolytes and monitor  Diabetes type 2 Monitor fingerstick with goal 140-180 Continue sliding scale insulin  Best practice (evaluated daily)   Diet: N.p.o.  pain/Anxiety/Delirium protocol (if indicated): Propofol VAP protocol (if indicated): HOB 30 degrees, suction as needed DVT prophylaxis: SCDs GI prophylaxis: Protonix Glucose control: SSI Mobility: Bedrest Code Status: Full code Disposition: ICU  Labs   CBC: Recent Labs  Lab 02/24/2020 1317 02/17/2020 1317 02/20/2020 1321 02/06/2020 1832 02/20/2020 2007 02/22/2020 2118 03/01/20 0500  WBC 6.3  --   --   --  6.1  --  7.9  NEUTROABS 4.2  --   --   --   --   --   --   HGB 13.6   < > 14.3 7.8* 13.1 12.9* 12.6*  HCT 42.1   < > 42.0 23.0* 39.3 38.0* 38.3*  MCV 100.5*  --   --   --  95.2  --  96.2  PLT 161  --   --   --  125*  --  128*   < > = values in this interval not displayed.    Basic Metabolic Panel: Recent Labs  Lab 03/06/2020 1317 03/03/2020 1321 02/08/2020 1832 02/14/2020 2118 03/01/20 0500  NA 140 140 147* 139 139  K 3.7 3.6 2.4* 4.4 3.7  CL 103 102 114*  --  109  CO2 29  --   --   --  20*  GLUCOSE 176* 163* 90  --  166*  BUN 26* 29* 15  --  26*  CREATININE 1.06 1.00 0.30*  --  1.03  CALCIUM 9.3  --   --   --  8.5*  MG  --   --   --   --  1.6*   GFR: Estimated Creatinine Clearance: 59.1 mL/min (by C-G formula based on SCr of 1.03 mg/dL). Recent Labs  Lab 02/18/2020 1317 02/08/2020 2007 03/01/20 0500  WBC 6.3 6.1 7.9    Liver Function Tests: Recent Labs  Lab 03/06/2020 1317 03/01/20 0500  AST 21 17  ALT 23 19  ALKPHOS 82 67  BILITOT 1.3* 1.4*  PROT 6.3* 5.1*  ALBUMIN 3.4* 3.1*   No results for input(s): LIPASE, AMYLASE in the last 168 hours. No results for input(s): AMMONIA in the last 168 hours.  ABG    Component Value Date/Time   PHART 7.412 02/15/2020 2118   PCO2ART 37.5 02/12/2020 2118   PO2ART 195 (H) 03/03/2020 2118   HCO3 23.8 02/18/2020 2118   TCO2 25 03/01/2020 2118    ACIDBASEDEF 1.0 02/22/2020 2118   O2SAT 100.0 03/02/2020 2118     Coagulation Profile: Recent Labs  Lab 03/05/2020 1317 03/01/20 0500  INR 1.4* 1.5*    Cardiac Enzymes: No results for input(s): CKTOTAL, CKMB, CKMBINDEX, TROPONINI in the last 168 hours.  HbA1C: Hgb A1c MFr Bld  Date/Time Value Ref Range Status  03/01/2020 05:00 AM 6.3 (H) 4.8 - 5.6 % Final    Comment:    (NOTE) Pre diabetes:          5.7%-6.4%  Diabetes:              >6.4%  Glycemic control for   <7.0% adults with diabetes     CBG: Recent Labs  Lab 02/08/2020 1314  02/28/2020 2312 03/01/20 0323 03/01/20 0734  GLUCAP 167* 174* 166* 155*    Review of Systems:   Unable to obtain as patient is intubated  Past Medical History  He,  has a past medical history of Atrial fibrillation (HCC), CAD (coronary artery disease), Carotid artery disease (HCC), Diabetes mellitus (HCC), Gout, Hyperlipidemia, and Hypertension.   Surgical History    Past Surgical History:  Procedure Laterality Date  . CORONARY STENT PLACEMENT    . PROSTATE SURGERY    . REPLACEMENT TOTAL KNEE Right   . SPINE SURGERY       Social History   reports that he has been smoking cigars. He does not have any smokeless tobacco history on file.   Family History   His family history includes CAD in his father and mother; CVA in his father.   Allergies Allergies  Allergen Reactions  . Ibuprofen      Home Medications  Prior to Admission medications   Not on File      Total critical care time: 41 minutes  Performed by: Cheri Fowler   Critical care time was exclusive of separately billable procedures and treating other patients.   Critical care was necessary to treat or prevent imminent or life-threatening deterioration.   Critical care was time spent personally by me on the following activities: development of treatment plan with patient and/or surrogate as well as nursing, discussions with consultants, evaluation of patient's  response to treatment, examination of patient, obtaining history from patient or surrogate, ordering and performing treatments and interventions, ordering and review of laboratory studies, ordering and review of radiographic studies, pulse oximetry and re-evaluation of patient's condition.   Cheri Fowler MD Critical care physician [facility]  Pager: [phone] Mobile: [phone]

## 2020-03-01 NOTE — Progress Notes (Signed)
PT Cancellation Note  Patient Details Name: Richard Farmer MRN: [id number] DOB: May 09, 1940   Cancelled Treatment:    Reason Eval/Treat Not Completed: Patient not medically ready.  Pt still intubated, RN asks to hold today. 03/01/2020  Jacinto Halim., PT Acute Rehabilitation Services [phone]  (pager) [phone]  (office)   Richard Farmer Richard Farmer 03/01/2020, 8:14 AM

## 2020-03-01 NOTE — Progress Notes (Signed)
Referring Physician(s): CODE STROKE  Supervising Physician: Gilmer Mor  Patient Status:  [facility] - In-pt  Chief Complaint: R ICA occlusion  Subjective: Intubated/sedated. Moves right and left side, R>L. Procedure site intact.  CT this AM demonstrates R MCA infarct  Allergies: Ibuprofen  Medications: Prior to Admission medications   Not on File     Vital Signs: BP 134/88   Pulse 85   Temp 97.8 F (36.6 C) (Axillary)   Resp 19   Ht 5\' 6"  (1.676 m)   Wt 191 lb 12.8 oz (87 kg)   SpO2 100%   BMI 30.96 kg/m   Physical Exam  Intubate/sedated Neuro: clouded by sedation, L toes withdrawal to pain, spontaneous movement with L fingers.  Groin: intact, soft.  No evidence of hematoma or pseudoaneurysm. Pulses: distal pulses intact- identified by doppler.   Imaging: CT HEAD WO CONTRAST  Result Date: 03/01/2020 CLINICAL DATA:  Stroke, follow-up. EXAM: CT HEAD WITHOUT CONTRAST TECHNIQUE: Contiguous axial images were obtained from the base of the skull through the vertex without intravenous contrast. COMPARISON:  Mar 30, 2020 head CT. FINDINGS: Brain: Ill-defined hypodense region and blurring of the gray-white junctions involving the right MCA territory. No intracranial hemorrhage. No mass lesion. No midline shift, ventriculomegaly or extra-axial fluid collection. Mild cerebral atrophy with ex vacuo dilatation. Vascular: No hyperdense vessel or unexpected calcification. Bilateral skull base atherosclerotic calcifications. Skull: Negative for fracture or focal lesion. Sinuses/Orbits: Normal orbits. Minimal pansinus mucosal thickening with trace layering maxillary sinus secretions. Pneumatized mastoid air cells. Other: None. IMPRESSION: 1. Acute right MCA territory infarct. 2. Mild cerebral atrophy. 3. Mild pansinus disease. Trace layering secretions may reflect active inflammation. These results will be called to the ordering clinician or representative by the Radiologist Assistant,  and communication documented in the PACS or 03/02/2020. Electronically Signed   By: [employer] M.D.   On: 03/01/2020 06:05   CT CEREBRAL PERFUSION W CONTRAST  Result Date: 03-30-2020 CLINICAL DATA:  Left-sided weakness EXAM: CT PERFUSION BRAIN TECHNIQUE: Multiphase CT imaging of the brain was performed following IV bolus contrast injection. Subsequent parametric perfusion maps were calculated using RAPID software. CONTRAST:  107mL OMNIPAQUE IOHEXOL 350 MG/ML SOLN COMPARISON:  None. FINDINGS: CT Brain Perfusion Findings: CBF (<30%) Volume: 73mL Perfusion (Tmax>6.0s) volume: 1m Mismatch Volume: 06/01/2046mL ASPECTS on noncontrast CT Head: 10 at 1:19 p.m. today. Infarct Core: 0 mL Infarction Location: Right MCA territory and watershed territories. IMPRESSION: No core infarction. Large area of territory at risk throughout the right MCA territory as well as MCA/ACA and MCA/PCA watershed territories. Electronically Signed   By: 11/05/2044 M.D.   On: 03-30-20 13:55   DG Chest Port 1 View  Result Date: 03-30-2020 CLINICAL DATA:  Stroke-like symptoms. EXAM: PORTABLE CHEST 1 VIEW COMPARISON:  May 23, 2019 FINDINGS: An endotracheal tube is seen with its distal tip approximately 5.7 cm from the carina. A nasogastric tube is present. Its distal end extends below the level of the diaphragm. The cardiac silhouette is mildly enlarged. There is moderate severity calcification of the aortic arch. Degenerative changes seen throughout the thoracic spine. IMPRESSION: 1. Endotracheal tube and nasogastric tube in good position. 2. No acute infiltrate. Electronically Signed   By: May 25, 2019 M.D.   On: 03/30/2020 20:26   DG Abd Portable 1V  Result Date: March 30, 2020 CLINICAL DATA:  Gastric tube placement EXAM: PORTABLE ABDOMEN - 1 VIEW COMPARISON:  None FINDINGS: Gastric tube in the proximal stomach. Side port likely just above the GE  junction. Basilar atelectasis. No effusion. Paucity of gas  in the abdomen. Excreted contrast in the collecting systems of bilateral kidneys. No acute skeletal process to the extent evaluated. IMPRESSION: Gastric tube in the proximal stomach with side port likely just above the GE junction. Consider advancing a few more centimeters to ensure intragastric positioning. These results will be called to the ordering clinician or representative by the Radiologist Assistant, and communication documented in the PACS or [employer]. Electronically Signed   By: Donzetta Kohut M.D.   On: March 26, 2020 21:23   CT HEAD CODE STROKE WO CONTRAST  Result Date: 03-26-2020 CLINICAL DATA:  Code stroke. 79 year old male with left side weakness. EXAM: CT HEAD WITHOUT CONTRAST TECHNIQUE: Contiguous axial images were obtained from the base of the skull through the vertex without intravenous contrast. COMPARISON:  None. FINDINGS: Brain: No midline shift, mass effect, or evidence of intracranial mass lesion. No acute intracranial hemorrhage identified. No ventriculomegaly. Patchy mild to moderate for age bilateral cerebral white matter hypodensity. No cortically based acute infarct identified. Vascular: Calcified atherosclerosis at the skull base. No suspicious intracranial vascular hyperdensity. Skull: Degenerative changes in the visible upper cervical spine. No acute osseous abnormality identified. Sinuses/Orbits: Visualized paranasal sinuses and mastoids are stable and well pneumatized. Other: There is some rightward gaze. No other acute orbit finding. Possible right forehead scalp hematoma or contusion on series 4, image 49. Underlying calvarium intact. ASPECTS [facility] Stroke Program Early CT Score) - Ganglionic level infarction (caudate, lentiform nuclei, internal capsule, insula, M1-M3 cortex): - Supraganglionic infarction (M4-M6 cortex): Total score (0-10 with 10 being normal): IMPRESSION: 1. No acute cortically based infarct or acute intracranial hemorrhage identified. ASPECTS 10.  Mild to moderate for age cerebral white matter changes. 2. Possible right forehead scalp hematoma or contusion. No underlying skull fracture. 3. These results were communicated to Dr.Khaliqdina at 1:29 pm on 03-26-2020 by text page via the [facility] messaging system. Electronically Signed   By: Odessa Fleming M.D.   On: 2020-03-26 13:29   CT ANGIO HEAD CODE STROKE  Result Date: 2020/03/26 CLINICAL DATA:  Stroke/TIA.  Left-sided weakness. EXAM: CT ANGIOGRAPHY HEAD AND NECK TECHNIQUE: Multidetector CT imaging of the head and neck was performed using the standard protocol during bolus administration of intravenous contrast. Multiplanar CT image reconstructions and MIPs were obtained to evaluate the vascular anatomy. Carotid stenosis measurements (when applicable) are obtained utilizing NASCET criteria, using the distal internal carotid diameter as the denominator. CONTRAST:  20mL OMNIPAQUE IOHEXOL 350 MG/ML SOLN COMPARISON:  Same day CT head. 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 Right carotid system: There is calcific and noncalcific atherosclerosis at the carotid bifurcation. 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 Non opacification of the remainder of the ICA in the neck. Left carotid system: Calcific atherosclerosis of the common carotid artery. There is calcified and noncalcified atherosclerosis at the bifurcation with a web (see series 6, image 225), but without greater than 50% narrowing. Vertebral arteries: Mildly left dominant. Moderate stenosis of the left vertebral artery origin. Otherwise, no hemodynamically significant stenosis in the neck. 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 Upper chest: No acute abnormality. Review of the MIP images confirms the above findings CTA HEAD FINDINGS Anterior circulation: Occlusion of the  right internal carotid artery to the level of the carotid terminus. The right MCA and ACA branches are well  opacified proximally. There is moderate stenosis of the right A2 ACA, likely related to atherosclerosis. Posterior circulation: Moderate stenosis of bilateral intradural vertebral arteries secondary to calcific atherosclerosis. Left fetal type PCA. Moderate stenosis of the proximal right P2 PCA with additional severe stenosis of the more distal P2 PCA. Venous sinuses: As permitted by contrast timing, patent. Review of the MIP images confirms the above findings IMPRESSION: 1. 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 The right MCA and ACA branches are opacified. 2. 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 3. Multifocal moderate atherosclerotic stenoses, including the right A2 ACA, proximal right P2 PCA (in addition to severe distal stenosis), bilateral intradural vertebral arteries, and left vertebral artery origin. 4. 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 5. 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 Conclusion #1 was discussed with Dr. Derry Lory at 1:42 PM via telephone. Electronically Signed   By: Feliberto Harts MD   On: 02/14/2020 14:02   CT ANGIO NECK CODE STROKE  Result Date: 02/25/2020 CLINICAL DATA:  Stroke/TIA.  Left-sided weakness. EXAM: CT ANGIOGRAPHY HEAD AND NECK TECHNIQUE: Multidetector CT imaging of the head and neck was performed using the standard protocol during bolus administration of intravenous contrast. Multiplanar CT image reconstructions and MIPs were obtained to evaluate the vascular anatomy. Carotid stenosis measurements (when applicable) are obtained utilizing NASCET criteria, using the distal internal carotid diameter as the denominator. CONTRAST:  55mL OMNIPAQUE IOHEXOL 350 MG/ML SOLN  COMPARISON:  Same day CT head. 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 Right carotid system: There is calcific and noncalcific atherosclerosis at the carotid bifurcation. 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 Non opacification of the remainder of the ICA in the neck. Left carotid system: Calcific atherosclerosis of the common carotid artery. There is calcified and noncalcified atherosclerosis at the bifurcation with a web (see series 6, image 225), but without greater than 50% narrowing. Vertebral arteries: Mildly left dominant. Moderate stenosis of the left vertebral artery origin. Otherwise, no hemodynamically significant stenosis in the neck. 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 Upper chest: No acute abnormality. Review of the MIP images confirms the above findings CTA HEAD FINDINGS Anterior circulation: Occlusion of the right internal carotid artery to the level of the carotid terminus. The right MCA and ACA branches are well opacified proximally. There is moderate stenosis of the right A2 ACA, likely related to atherosclerosis. Posterior circulation: Moderate stenosis of bilateral intradural vertebral arteries secondary to calcific atherosclerosis. Left fetal type PCA. Moderate stenosis of the proximal right P2 PCA with additional severe stenosis of the more distal P2 PCA. Venous sinuses: As permitted by contrast timing, patent. Review of the MIP images confirms the above findings IMPRESSION: 1. 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 The right MCA and ACA branches are opacified. 2. 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 3. Multifocal moderate atherosclerotic stenoses, including the right A2 ACA,  proximal right P2 PCA (in addition to severe distal stenosis), bilateral intradural vertebral arteries, and left vertebral artery origin. 4. 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 5. Severe degenerative disc disease at  C6-C7 and at the craniocervical junction. Conclusion #1 was discussed with Dr. Derry LoryKhaliqdina at 1:42 PM via telephone. Electronically Signed   By: Feliberto HartsFrederick S Jones MD   On: 03/02/2020 14:02    Labs:  CBC: Recent Labs    02/20/2020 1317 02/12/2020 1321 02/18/2020 1832 02/28/2020 2007 03/02/2020 2118 03/01/20 0500  WBC 6.3  --   --  6.1  --  7.9  HGB 13.6   < > 7.8* 13.1 12.9* 12.6*  HCT 42.1   < > 23.0* 39.3 38.0* 38.3*  PLT 161  --   --  125*  --  128*   < > = values in this interval not displayed.    COAGS: Recent Labs    02/15/2020 1317 03/01/20 0500  INR 1.4* 1.5*  APTT 30  --     BMP: Recent Labs    02/16/2020 1317 02/19/2020 1317 02/25/2020 1321 02/08/2020 1832 02/27/2020 2118 03/01/20 0500  NA 140   < > 140 147* 139 139  K 3.7   < > 3.6 2.4* 4.4 3.7  CL 103  --  102 114*  --  109  CO2 29  --   --   --   --  20*  GLUCOSE 176*  --  163* 90  --  166*  BUN 26*  --  29* 15  --  26*  CALCIUM 9.3  --   --   --   --  8.5*  CREATININE 1.06  --  1.00 0.30*  --  1.03  GFRNONAA >60  --   --   --   --  >60   < > = values in this interval not displayed.    LIVER FUNCTION TESTS: Recent Labs    02/08/2020 1317 03/01/20 0500  BILITOT 1.3* 1.4*  AST 21 17  ALT 23 19  ALKPHOS 82 67  PROT 6.3* 5.1*  ALBUMIN 3.4* 3.1*    Assessment and Plan: PTA and stenting of the right ICA, 759mm-7mm Xact stent, presuming a permissive lesion of the bifurcation Attempted thrombectomy of the intracranial ICA below terminus and of the right MCA/M1 (embolism new territory), unsuccesful Patient s/p unsuccessful attempt to revascularize intermittently symptomatic right ICA occlusion.  Now with R MCA infarct.  Remains intubated/sedated.  Some movement noted in  fingers and toes on the left, however neuro exam clouded by sedation.  Procedure site intact. No evidence of pseudoaneurysm or hematoma.  Plans per Neuro and CCM.  Dual antiplatelet per discretion of Neuro, however per Dr. Loreta AveWagner either aspirin or Brilinta alone may be sufficient once anticoagulation restarted.  IR available if needed.   Electronically Signed: Hoyt KochKacie Sue-Ellen Kirbi Farrugia, PA 03/01/2020, 10:34 AM   I spent a total of 15 Minutes at the the patient's bedside AND on the patient's hospital floor or unit, greater than 50% of which was counseling/coordinating care for R ICA occlusion, R MCA infarct

## 2020-03-01 NOTE — Progress Notes (Signed)
OT Cancellation Note  Patient Details Name: Richard Farmer MRN: [id number] DOB: 20-Sep-1940   Cancelled Treatment:    Reason Eval/Treat Not Completed: Patient not medically ready (Pt remains intubated, RN requesting hold at this time. Will continue to follow as available and appropriate)  Dalphine Handing, MSOT, OTR/L Acute Rehabilitation Services [facility] Office Number: [phone] Pager: [phone]  Dalphine Handing 03/01/2020, 9:53 AM

## 2020-03-01 NOTE — Progress Notes (Signed)
Patient transported to CT and back to 4N15 without incidence.

## 2020-03-01 NOTE — Progress Notes (Signed)
STROKE TEAM PROGRESS NOTE   INTERVAL HISTORY He presented with recurrent transient episodes of left hemiparesis and peripheral vision loss in the setting of acute right carotid occlusion.  CT perfusion showed a core infarct of 0 but penumbra of 146 cc and hence is taken for emergent right carotid revascularization which was attempted unsuccessfully with resultant persistent right carotid occlusion.  Patient was left intubated overnight and sedated with propofol.  Blood pressure adequately controlled.  Follow-up CT scan this morning shows large right middle cerebral artery infarct with cytotoxic edema but no hemorrhage.  Vitals:   03/01/20 0600 03/01/20 0700 03/01/20 0800 03/01/20 0900  BP: 123/69 129/83 (!) 146/123 129/79  Pulse:  91 90 88  Resp: 15 15 14 18   Temp:   97.8 F (36.6 C)   TempSrc:   Axillary   SpO2:  98% 100% 98%  Weight:      Height:       CBC:  Recent Labs  Lab Mar 02, 2020 1317 March 02, 2020 1321 03-02-20 2007 March 02, 2020 2007 2020/03/02 2118 03/01/20 0500  WBC 6.3   < > 6.1  --   --  7.9  NEUTROABS 4.2  --   --   --   --   --   HGB 13.6   < > 13.1   < > 12.9* 12.6*  HCT 42.1   < > 39.3   < > 38.0* 38.3*  MCV 100.5*   < > 95.2  --   --  96.2  PLT 161   < > 125*  --   --  128*   < > = values in this interval not displayed.   Basic Metabolic Panel:  Recent Labs  Lab 2020/03/02 1317 Mar 02, 2020 1321 03/02/20 1832 03-02-2020 1832 03/02/2020 2118 03/01/20 0500  NA 140   < > 147*   < > 139 139  K 3.7   < > 2.4*   < > 4.4 3.7  CL 103   < > 114*  --   --  109  CO2 29  --   --   --   --  20*  GLUCOSE 176*   < > 90  --   --  166*  BUN 26*   < > 15  --   --  26*  CREATININE 1.06   < > 0.30*  --   --  1.03  CALCIUM 9.3  --   --   --   --  8.5*  MG  --   --   --   --   --  1.6*   < > = values in this interval not displayed.   Lipid Panel:  Recent Labs  Lab 03/01/20 0500  TRIG 142   HgbA1c:  Recent Labs  Lab 03/01/20 0500  HGBA1C 6.3*   Urine Drug Screen: No results for  input(s): LABOPIA, COCAINSCRNUR, LABBENZ, AMPHETMU, THCU, LABBARB in the last 168 hours.  Alcohol Level No results for input(s): ETH in the last 168 hours.  IMAGING past 24 hours CT HEAD WO CONTRAST  Result Date: 03/01/2020 CLINICAL DATA:  Stroke, follow-up. EXAM: CT HEAD WITHOUT CONTRAST TECHNIQUE: Contiguous axial images were obtained from the base of the skull through the vertex without intravenous contrast. COMPARISON:  March 02, 2020 head CT. FINDINGS: Brain: Ill-defined hypodense region and blurring of the gray-white junctions involving the right MCA territory. No intracranial hemorrhage. No mass lesion. No midline shift, ventriculomegaly or extra-axial fluid collection. Mild cerebral atrophy with ex vacuo dilatation. Vascular: No hyperdense  vessel or unexpected calcification. Bilateral skull base atherosclerotic calcifications. Skull: Negative for fracture or focal lesion. Sinuses/Orbits: Normal orbits. Minimal pansinus mucosal thickening with trace layering maxillary sinus secretions. Pneumatized mastoid air cells. Other: None. IMPRESSION: 1. Acute right MCA territory infarct. 2. Mild cerebral atrophy. 3. Mild pansinus disease. Trace layering secretions may reflect active inflammation. These results will be called to the ordering clinician or representative by the Radiologist Assistant, and communication documented in the PACS or [employer]. Electronically Signed   By: Stana Bunting M.D.   On: 03/01/2020 06:05   CT CEREBRAL PERFUSION W CONTRAST  Result Date: 02/26/2020 CLINICAL DATA:  Left-sided weakness EXAM: CT PERFUSION BRAIN TECHNIQUE: Multiphase CT imaging of the brain was performed following IV bolus contrast injection. Subsequent parametric perfusion maps were calculated using RAPID software. CONTRAST:  40mL OMNIPAQUE IOHEXOL 350 MG/ML SOLN COMPARISON:  None. FINDINGS: CT Brain Perfusion Findings: CBF (<30%) Volume: 0mL Perfusion (Tmax>6.0s) volume: Mismatch Volume:  06/01/2046mL ASPECTS on noncontrast CT Head: 10 at 1:19 p.m. today. Infarct Core: 0 mL Infarction Location: Right MCA territory and watershed territories. IMPRESSION: No core infarction. Large area of territory at risk throughout the right MCA territory as well as MCA/ACA and MCA/PCA watershed territories. Electronically Signed   By: Guadlupe Spanish M.D.   On: 02/11/2020 13:55   DG Chest Port 1 View  Result Date: 02/14/2020 CLINICAL DATA:  Stroke-like symptoms. EXAM: PORTABLE CHEST 1 VIEW COMPARISON:  May 23, 2019 FINDINGS: An endotracheal tube is seen with its distal tip approximately 5.7 cm from the carina. A nasogastric tube is present. Its distal end extends below the level of the diaphragm. The cardiac silhouette is mildly enlarged. There is moderate severity calcification of the aortic arch. Degenerative changes seen throughout the thoracic spine. IMPRESSION: 1. Endotracheal tube and nasogastric tube in good position. 2. No acute infiltrate. Electronically Signed   By: Aram Candela M.D.   On: 02/21/2020 20:26   DG Abd Portable 1V  Result Date: 02/19/2020 CLINICAL DATA:  Gastric tube placement EXAM: PORTABLE ABDOMEN - 1 VIEW COMPARISON:  None FINDINGS: Gastric tube in the proximal stomach. Side port likely just above the GE junction. Basilar atelectasis. No effusion. Paucity of gas in the abdomen. Excreted contrast in the collecting systems of bilateral kidneys. No acute skeletal process to the extent evaluated. IMPRESSION: Gastric tube in the proximal stomach with side port likely just above the GE junction. Consider advancing a few more centimeters to ensure intragastric positioning. These results will be called to the ordering clinician or representative by the Radiologist Assistant, and communication documented in the PACS or [employer]. Electronically Signed   By: Donzetta Kohut M.D.   On: 02/17/2020 21:23   CT HEAD CODE STROKE WO CONTRAST  Result Date: 02/26/2020 CLINICAL  DATA:  Code stroke. 79 year old male with left side weakness. EXAM: CT HEAD WITHOUT CONTRAST TECHNIQUE: Contiguous axial images were obtained from the base of the skull through the vertex without intravenous contrast. COMPARISON:  None. FINDINGS: Brain: No midline shift, mass effect, or evidence of intracranial mass lesion. No acute intracranial hemorrhage identified. No ventriculomegaly. Patchy mild to moderate for age bilateral cerebral white matter hypodensity. No cortically based acute infarct identified. Vascular: Calcified atherosclerosis at the skull base. No suspicious intracranial vascular hyperdensity. Skull: Degenerative changes in the visible upper cervical spine. No acute osseous abnormality identified. Sinuses/Orbits: Visualized paranasal sinuses and mastoids are stable and well pneumatized. Other: There is some rightward gaze. No other acute orbit finding. Possible right  forehead scalp hematoma or contusion on series 4, image 49. Underlying calvarium intact. ASPECTS [facility] Stroke Program Early CT Score) - Ganglionic level infarction (caudate, lentiform nuclei, internal capsule, insula, M1-M3 cortex): - Supraganglionic infarction (M4-M6 cortex): Total score (0-10 with 10 being normal): IMPRESSION: 1. No acute cortically based infarct or acute intracranial hemorrhage identified. ASPECTS 10. Mild to moderate for age cerebral white matter changes. 2. Possible right forehead scalp hematoma or contusion. No underlying skull fracture. 3. These results were communicated to Dr.Khaliqdina at 1:29 pm on 02/09/2020 by text page via the [facility] messaging system. Electronically Signed   By: Odessa Fleming M.D.   On: 02/17/2020 13:29   CT ANGIO HEAD CODE STROKE  Result Date: 03/01/2020 CLINICAL DATA:  Stroke/TIA.  Left-sided weakness. EXAM: CT ANGIOGRAPHY HEAD AND NECK TECHNIQUE: Multidetector CT imaging of the head and neck was performed using the standard protocol during bolus administration of intravenous contrast.  Multiplanar CT image reconstructions and MIPs were obtained to evaluate the vascular anatomy. Carotid stenosis measurements (when applicable) are obtained utilizing NASCET criteria, using the distal internal carotid diameter as the denominator. CONTRAST:  75mL OMNIPAQUE IOHEXOL 350 MG/ML SOLN COMPARISON:  Same day CT head. 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 Right carotid system: There is calcific and noncalcific atherosclerosis at the carotid bifurcation. 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 Non opacification of the remainder of the ICA in the neck. Left carotid system: Calcific atherosclerosis of the common carotid artery. There is calcified and noncalcified atherosclerosis at the bifurcation with a web (see series 6, image 225), but without greater than 50% narrowing. Vertebral arteries: Mildly left dominant. Moderate stenosis of the left vertebral artery origin. Otherwise, no hemodynamically significant stenosis in the neck. 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 Upper chest: No acute abnormality. Review of the MIP images confirms the above findings CTA HEAD FINDINGS Anterior circulation: Occlusion of the right internal carotid artery to the level of the carotid terminus. The right MCA and ACA branches are well opacified proximally. There is moderate stenosis of the right A2 ACA, likely related to atherosclerosis. Posterior circulation: Moderate stenosis of bilateral intradural vertebral arteries secondary to calcific atherosclerosis. Left fetal type PCA. Moderate stenosis of the proximal right P2 PCA with additional severe stenosis of the more distal P2 PCA. Venous sinuses: As permitted by contrast timing, patent. Review of the MIP images confirms the above findings IMPRESSION: 1. Age indeterminate occlusion of the  right cervical ICA at its origin with reconstitution at the carotid terminus. The right MCA and ACA branches are opacified. 2. 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 3. Multifocal moderate atherosclerotic stenoses, including the right A2 ACA, proximal right P2 PCA (in addition to severe distal stenosis), bilateral intradural vertebral arteries, and left vertebral artery origin. 4. 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 5. 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 Conclusion #1 was discussed with Dr. Derry Lory at 1:42 PM via telephone. Electronically Signed   By: Feliberto Harts MD   On: 02/06/2020 14:02   CT ANGIO NECK CODE STROKE  Result Date: 02/24/2020 CLINICAL DATA:  Stroke/TIA.  Left-sided weakness. EXAM: CT ANGIOGRAPHY HEAD AND NECK TECHNIQUE: Multidetector CT imaging of the head and neck was performed using the standard protocol during bolus administration  of intravenous contrast. Multiplanar CT image reconstructions and MIPs were obtained to evaluate the vascular anatomy. Carotid stenosis measurements (when applicable) are obtained utilizing NASCET criteria, using the distal internal carotid diameter as the denominator. CONTRAST:  67mL OMNIPAQUE IOHEXOL 350 MG/ML SOLN COMPARISON:  Same day CT head. 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 Right carotid system: There is calcific and noncalcific atherosclerosis at the carotid bifurcation. 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 Non opacification of the remainder of the ICA in the neck. Left carotid system: Calcific atherosclerosis of the common carotid artery. There is calcified and noncalcified atherosclerosis at the bifurcation with a web (see series 6, image 225), but without greater than 50% narrowing. Vertebral arteries: Mildly left  dominant. Moderate stenosis of the left vertebral artery origin. Otherwise, no hemodynamically significant stenosis in the neck. 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 Upper chest: No acute abnormality. Review of the MIP images confirms the above findings CTA HEAD FINDINGS Anterior circulation: Occlusion of the right internal carotid artery to the level of the carotid terminus. The right MCA and ACA branches are well opacified proximally. There is moderate stenosis of the right A2 ACA, likely related to atherosclerosis. Posterior circulation: Moderate stenosis of bilateral intradural vertebral arteries secondary to calcific atherosclerosis. Left fetal type PCA. Moderate stenosis of the proximal right P2 PCA with additional severe stenosis of the more distal P2 PCA. Venous sinuses: As permitted by contrast timing, patent. Review of the MIP images confirms the above findings IMPRESSION: 1. 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 The right MCA and ACA branches are opacified. 2. 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 3. Multifocal moderate atherosclerotic stenoses, including the right A2 ACA, proximal right P2 PCA (in addition to severe distal stenosis), bilateral intradural vertebral arteries, and left vertebral artery origin. 4. 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 5. 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 Conclusion #1 was discussed with Dr. Derry Lory at 1:42 PM via telephone. Electronically Signed   By: Feliberto Harts MD   On: March 18, 2020 14:02    PHYSICAL EXAM Elderly Caucasian male sedated and intubated not in distress. . Afebrile. Head is nontraumatic. Neck is supple without bruit.    Cardiac exam no murmur or gallop.  Lungs are clear to auscultation. Distal pulses are well felt. Neurological Exam : Patient is sedated intubated on propofol drip.  Eyes are closed.  Right pupil is 4 mm irregular postsurgical but reactive.  Left is 3 mm reactive.  Doll's eye movements are sluggish.  He follows simple midline and occasional one-step commands on the right.  He does not blink to threat on both sides.  Fundi not visualized.  No facial weakness.  Tongue midline.  Motor system exam shows spontaneous right upper and lower extremity movements.  Withdraws left leg briskly.  Left upper extremity is flaccid, hypotonic and does not respond to painful stimulus.  He responds to sternal rub with slight extensor posturing of the left upper extremity and brisk withdrawal of the lower extremities.  There is semipurposeful movement in the right upper extremity.  Left plantar upgoing right downgoing.  Gait not tested ASSESSMENT/PLAN Richard Farmer is a 79 y.o. male with history of  AF on warfarin, know ICA stenosis presenting with L sided weakness and hemianopsia. He did not receive tPA. Taken to IR for emergent stenting of R ICA occlusion.  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 Small vessel disease. Atrophy. Possible R forehead scalp hematoma/contusion  CTA head & neck R cervical ICA origin occlusion at terminus. Critical stenosis brachiocephalic artery. Severe stenosis R P2. Multifocal moderate R A2, proximal R P2, severe distal R P2, B mid VA, L VA origin stenoses. B ICA atherosclerosis w/ L ICA web. Severe degenerative C6-7 disc dz.  CT perfusion no core. Entire R MCA, MCA/ACA, MCA/PCA watershed territory at risk  Cerebral angio / IR R ICA occlusion from bulb to proximal ICA terminus. Stent R ICA. Attempted thrombectomy intracranial ICA below terminus and R MCA/M1.   Post IR CT completed, results         CT head R MCA infarct. Atrophy. Sinus dz.  MRI  pending   2D Echo pending   LDL pending    HgbA1c 6.3  VTE prophylaxis - Lovenox 40 mg sq daily   aspirin 81 mg daily and warfarin daily prior to admission w/ INR 1.4 on arrival, now on No antithrombotic. Added aspirin and plavix.  Therapy recommendations:  pending   Disposition:  pending   Acute Respiratory Failure  Secondary to stroke  Intubated for IR, remains intubated following  CCM on board   Atrial Fibrillation  Home anticoagulation:  warfarin daily + asa 81  Admission INR 1.4  INR now 1.5  Not an AC candidate at this time d/t risk hemorrhage   At risk Cerebral edema  MRI pending   Consider 3% w/ worsening edema  For now, start IVF NS at 70h  Carotid Stenosis   Hx CEA   Hypertension  Home meds:  Coreg 12.5 bid, losartan 50,   Stable . Permissive hypertension (OK if < 220/120) but gradually normalize in 5-7 days . Long-term BP goal normotensive  Hyperlipidemia  Home meds:  lipitor 80 + omega 3  LDL pending, goal < 70  Continue statin at d/c  Diabetes type II Controlled  Home meds:  none  HgbA1c 6.3, goal < 7.0  CBGs Recent Labs    02/15/2020 2312 03/01/20 0323 03/01/20 0734  GLUCAP 174* 166* 155*      SSI  Dysphagia . Secondary to stroke . NPO . Speech on board   Other Stroke Risk Factors  Advanced Age >/= 3865   Cigar smoker, daily  ETOH use 4x wk  Obesity, Body mass index is 30.96 kg/m., BMI >/= 30 associated with increased stroke risk, recommend weight loss, diet and exercise as appropriate   Family hx stroke (father)  Coronary artery disease s/p stent  Other Active Problems  Gout on allopurinal 300 daily  GERD on prilosec  BPH on flomax  Thrombocytopenia PLT 128  Hypokalemia K 2.4->4.4->3.7 - resolved  Hypomagnesemia Mg 1.6  Hospital day # 1 Patient presented with right carotid occlusion with fluctuating right hemispheric ischemic symptoms and  required attempt at emergent right carotid revascularization unfortunately it was not successful and he has persistent right carotid occlusion and CT scan shows large right MCA infarct with cytotoxic edema.  He remains at risk for neurological worsening with increased edema and/or hemorrhagic transformation.  Recommend close neurological monitoring.  Check MRI scan of the brain.  Mild permissive hypertension.  Long discussion with the patient's wife at the bedside as well as with her son whom I spoke to over the phone and Dr. Merrily Pew from critical care medicine about plan of care and answered questions.  Family may need to make decisions about 1 of extubation versus reintubation, DNR, trach and PEG as is likely going to survive with significant deficits from the stroke.  May need to consider starting hypertonic saline if MRI later today shows progressive cytotoxic edema.  Start aspirin and Plavix for secondary stroke prevention and aggressive risk factor modification This patient is critically ill and at significant risk of neurological worsening, death and care requires constant monitoring of vital signs, hemodynamics,respiratory and cardiac monitoring, extensive review of multiple databases, frequent neurological assessment, discussion with family, other specialists and medical decision making of high complexity.I have made any additions or clarifications directly to the above note.This critical care time does not reflect procedure time, or teaching time or supervisory time of PA/NP/Med Resident etc but could involve care discussion time.  I spent 40 minutes of neurocritical care time  in the care of  this patient.  Delia Heady, MD   To contact Stroke Continuity provider, please refer to [email or web address]. After hours, contact General Neurology

## 2020-03-01 NOTE — Progress Notes (Signed)
Echocardiogram 2D Echocardiogram has been performed.  Warren Lacy Tamorah Hada 03/01/2020, 2:11 PM

## 2020-03-02 ENCOUNTER — Inpatient Hospital Stay (HOSPITAL_COMMUNITY): Payer: Medicare Other

## 2020-03-02 ENCOUNTER — Encounter (HOSPITAL_COMMUNITY): Payer: Self-pay | Admitting: Radiology

## 2020-03-02 DIAGNOSIS — I63511 Cerebral infarction due to unspecified occlusion or stenosis of right middle cerebral artery: Secondary | ICD-10-CM

## 2020-03-02 LAB — GLUCOSE, CAPILLARY
Glucose-Capillary: 98 mg/dL (ref 70–99)
Glucose-Capillary: 105 mg/dL — ABNORMAL HIGH (ref 70–99)
Glucose-Capillary: 154 mg/dL — ABNORMAL HIGH (ref 70–99)
Glucose-Capillary: 121 mg/dL — ABNORMAL HIGH (ref 70–99)
Glucose-Capillary: 126 mg/dL — ABNORMAL HIGH (ref 70–99)
Glucose-Capillary: 131 mg/dL — ABNORMAL HIGH (ref 70–99)

## 2020-03-02 LAB — PHOSPHORUS: Phosphorus: 1.8 mg/dL — ABNORMAL LOW (ref 2.5–4.6)

## 2020-03-02 LAB — CBC
HCT: 38.4 % — ABNORMAL LOW (ref 39.0–52.0)
Hemoglobin: 12.8 g/dL — ABNORMAL LOW (ref 13.0–17.0)
MCH: 32.7 pg (ref 26.0–34.0)
MCHC: 33.3 g/dL (ref 30.0–36.0)
MCV: 98 fL (ref 80.0–100.0)
Platelets: 126 10*3/uL — ABNORMAL LOW (ref 150–400)
RBC: 3.92 MIL/uL — ABNORMAL LOW (ref 4.22–5.81)
RDW: 14.8 % (ref 11.5–15.5)
WBC: 9.6 10*3/uL (ref 4.0–10.5)
nRBC: 0 % (ref 0.0–0.2)

## 2020-03-02 LAB — BASIC METABOLIC PANEL
Anion gap: 13 (ref 5–15)
BUN: 21 mg/dL (ref 8–23)
CO2: 20 mmol/L — ABNORMAL LOW (ref 22–32)
Calcium: 8.7 mg/dL — ABNORMAL LOW (ref 8.9–10.3)
Chloride: 112 mmol/L — ABNORMAL HIGH (ref 98–111)
Creatinine, Ser: 0.9 mg/dL (ref 0.61–1.24)
GFR, Estimated: >60 mL/min (ref >60)
Glucose, Bld: 132 mg/dL — ABNORMAL HIGH (ref 70–99)
Potassium: 3.3 mmol/L — ABNORMAL LOW (ref 3.5–5.1)
Sodium: 145 mmol/L (ref 135–145)

## 2020-03-02 LAB — LIPID PANEL
Cholesterol: 96 mg/dL (ref 0–200)
HDL: 28 mg/dL — ABNORMAL LOW (ref >40)
LDL Cholesterol: 42 mg/dL (ref 0–99)
Total CHOL/HDL Ratio: 3.4 RATIO
Triglycerides: 132 mg/dL (ref <150)
VLDL: 26 mg/dL (ref 0–40)

## 2020-03-02 LAB — SODIUM
Sodium: 146 mmol/L — ABNORMAL HIGH (ref 135–145)
Sodium: 149 mmol/L — ABNORMAL HIGH (ref 135–145)
Sodium: 150 mmol/L — ABNORMAL HIGH (ref 135–145)

## 2020-03-02 LAB — MAGNESIUM: Magnesium: 2.1 mg/dL (ref 1.7–2.4)

## 2020-03-02 MED ORDER — OSMOLITE 1.2 CAL PO LIQD
1000.0000 mL | ORAL | Status: DC
  Administered 2020-03-02: 1000 mL
  Filled 2020-03-02: qty 1000

## 2020-03-02 MED ORDER — ATORVASTATIN CALCIUM 40 MG PO TABS
40.0000 mg | ORAL_TABLET | Freq: Every day | ORAL | Status: DC
  Administered 2020-03-03 – 2020-03-10 (×8): 40 mg
  Filled 2020-03-02 (×9): qty 1

## 2020-03-02 MED ORDER — POTASSIUM CHLORIDE 20 MEQ/15ML (10%) PO SOLN
40.0000 meq | Freq: Once | ORAL | Status: AC
  Administered 2020-03-02: 40 meq
  Filled 2020-03-02: qty 30

## 2020-03-02 MED ORDER — ORAL CARE MOUTH RINSE
15.0000 mL | Freq: Two times a day (BID) | OROMUCOSAL | Status: DC
  Administered 2020-03-02 – 2020-03-12 (×20): 15 mL via OROMUCOSAL

## 2020-03-02 MED ORDER — SODIUM CHLORIDE 3 % IV SOLN
INTRAVENOUS | Status: DC
  Filled 2020-03-02 (×5): qty 500

## 2020-03-02 MED ORDER — PROSOURCE TF PO LIQD
45.0000 mL | Freq: Every day | ORAL | Status: DC
  Administered 2020-03-02 – 2020-03-10 (×9): 45 mL
  Filled 2020-03-02 (×10): qty 45

## 2020-03-02 MED ORDER — OSMOLITE 1.2 CAL PO LIQD: 1000.0000 mL | ORAL | Status: DC

## 2020-03-02 NOTE — Progress Notes (Signed)
SLP Cancellation Note  Patient Details Name: Richard Farmer MRN: [id number] DOB: [birth year]   Cancelled evaluation: SLP order received. Pt is currently still on the vent. Will follow up for evaluation post-extubation.          Richard Allemand L. Samson Frederic, MA CCC/SLP Acute Rehabilitation Services Office number [phone] Pager [phone]  Richard Farmer 03/02/2020, 9:08 AM

## 2020-03-02 NOTE — Progress Notes (Signed)
NAME:  Richard Farmer, MRN:  [id number], DOB:  07-16-1940, LOS: 2 ADMISSION DATE:  12-Aug-2019, CONSULTATION DATE:  03/02/20 REFERRING MD:  Loreta AveWagner, CHIEF COMPLAINT:   L-sided weakness, CVA  Brief History   79 y.o. M of atrial fibrillation on coumadin, CAD, carotid stenosis, DM, HL and HTN who presented with L-sided weakness and found to have right ICA cervical occlusion s/p stenting right ICA and unsuccessful thrombectomy of right MCA/M1 and intracranial ICA.  Left intubated post procedure   Past Medical History   has a past medical history of Atrial fibrillation (HCC), CAD (coronary artery disease), Carotid artery disease (HCC), Diabetes mellitus (HCC), Gout, Hyperlipidemia, and Hypertension.   Significant Hospital Events   11/24 Admit to Neurology  Consults:  PCCM  Procedures:  11/24 ETT >> 11/24 left radial aline >>  Significant Diagnostic Tests:  11/24 CTA head>> Age indeterminate occlusion of the right cervical ICA at its origin with reconstitution at the carotid terminus. The right MCA and ACA branches are opacified. 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  Multifocal moderate atherosclerotic stenoses, including the right A2 ACA, proximal right P2 PCA (in addition to severe distal stenosis), bilateral intradural vertebral arteries, and left vertebral artery origin.  11/24 CXR>> no acute infiltrate, ETT in good position  11/25 CT head : Acute right MCA territory infarct. Mild cerebral atrophy. Mild pansinus disease. Trace layering secretions may reflect. active inflammation.  11/25 MRI brain >> 1. Acute infarction affecting the right middle cerebral artery territory with sparing of the putamen, globus pallidus and caudate head. Region of cortical and subcortical infarction measures up to 11.5 cm. Minimal petechial blood products in the region of the infarction. No measurable hematoma. No midline shift. 2. Abnormal flow pattern in the  right ICA at the base of the brain. There appears to be partial thrombosis or dissection.  11/25 TTE >> 1. Left ventricular ejection fraction, by estimation, is 60 to 65%. The left ventricle has normal function. The left ventricle has no regional wall motion abnormalities. There is mild left ventricular hypertrophy. Left ventricular diastolic parameters  are indeterminate.  2. Right ventricular systolic function is normal. The right ventricular size is mildly enlarged. There is mildly elevated pulmonary artery systolic pressure. The estimated right ventricular systolic pressure is 37.4 mmHg.  3. Left atrial size was moderately dilated.  4. Right atrial size was moderately dilated.  5. The mitral valve is normal in structure. Trivial mitral valve regurgitation. No evidence of mitral stenosis.  6. Tricuspid valve regurgitation is moderate.  7. The aortic valve is calcified. There is moderate calcification of the aortic valve. There is moderate thickening of the aortic valve. Aortic valve regurgitation is not visualized. Mild aortic valve stenosis. Aortic valve mean gradient measures 15.6  mmHg. Aortic valve Vmax measures 2.56 m/s.  8. The inferior vena cava is dilated in size with >50% respiratory variability, suggesting right atrial pressure of 8 mmHg.   11/26 CTH >> 1. 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 No hemorrhagic transformation or other complication. Localized edema without midline shift. 2. No other new acute intracranial abnormality.   Micro Data:  11/24 Covid-19 and Influenza>>negative 11/24 MRSA  >> neg  Antimicrobials:  n/a  Interim history/subjective:  No events overnight tmax 100.6 Hypertonic saline at 75 ml/hr- pending am labs Following commands on right/ purposeful   Objective   Blood pressure (!) 198/89, pulse 89,  temperature 99.1 F (37.3 C), temperature source Oral, resp. rate 15,  height 5\' 6"  (1.676 m), weight 87 kg, SpO2 100 %.    Vent Mode: PSV;CPAP FiO2 (%):  [30 %] 30 % Set Rate:  [16 bmp] 16 bmp Vt Set:  [510 mL] 510 mL PEEP:  [5 cmH20] 5 cmH20 Pressure Support:  [5 cmH20] 5 cmH20 Plateau Pressure:  [13 cmH20-18 cmH20] 14 cmH20   Intake/Output Summary (Last 24 hours) at 03/02/2020 0805 Last data filed at 03/02/2020 0600 Gross per 24 hour  Intake 1819.19 ml  Output 1450 ml  Net 369.19 ml   Filed Weights   02/19/2020 1300 02/25/2020 1403  Weight: 87 kg 87 kg   General:  Elderly male lying in bed in NAD HEENT: MM pink/moist, ETT/ OGT, pupils R 5/not reactive, L 3-4/ reactive, anicteric  Neuro: raises eyebrows but unable to open eyes- will look to the right, f/c on RUE 4/5, RLE 2/5, flicker of movement in LLE, flaccid in LUE/ at times extensor posturing CV: ir ir, no murmur, right femoral site wnl PULM:  Weaning PSV 5/5 well currently, CTA GI: soft, NT/ND, bs+, foley- cyu Extremities: warm/dry, no LE edema  Skin: no rashes  Resolved Hospital Problem list     Assessment & Plan:  Acute right MCA territory stroke, due to right ICA/right M1 occlusion status post attempted thrombectomy and stent placement in the right ICA Cerebral edema  Per Neurology / NIR MRI brain as above with f/u CTH this am showing large right MCA territory infarct with stable/ localized edema, no hemorrhagic transformation Further imaging per neuro  Continue hypertonic saline at 75 ml/hr- via PIV.  May need to consider PICC later today.  Trending Na q 6hr, goal Na 155-160 Serial neuro exams Ongoing permissive hypertension per ischemic stroke guidelines, treating if SBP > 220 TTE LVEF 60-65%, no regional wall abnormalities, indeterminate diastolic function, mildly elevated RSVP 37.4 mmHg with moderate TR Continue aspirin and Brilinta Continue secondary stroke prophylaxis Adding Lovenox today for DVT ppx Will place cortrak to ensure med administration, anticipating significant  dysphagia if extubated  Acute respiratory failure due to stroke Tolerating PSV trial so far this morning and following purposeful commands.  Might be candidate for trial of extubation later today.  Given his large stroke and cerebral edema, he will be at very high risk for re-intubation, respiratory failure, and high aspiration risk.  Will need to discuss with wife/ family GOC/ plan first prior to any decision to extubate.   Continue mechanical ventilation, PRVC Daily WUA/ SBT VAP bundle  Chronic atrial fibrillation Rate controlled Continue to hold home coumadin for now  Hypokalemia/hypomagnesemia Additional K 40 meq x 2 Recheck mag level  Diabetes type 2 Monitor fingerstick with goal 140-180 Continue sliding scale insulin  Tmax 100.6 WBC remains stable.  Continue to monitor clinically, if ongoing, will pan-culture and repeat CXR   Best practice (evaluated daily)   Diet: NPO, start TF if not extubated  pain/Anxiety/Delirium protocol (if indicated): Propofol, RASS goal 0 VAP protocol (if indicated): HOB 30 degrees, suction as needed DVT prophylaxis: SCDs, adding lovenox today  GI prophylaxis: Protonix Glucose control: SSI Mobility: Bedrest Code Status: Full code Disposition: ICU  Labs   CBC: Recent Labs  Lab 02/18/2020 1317 02/14/2020 1321 03/06/2020 1832 03/06/2020 2007 02/25/2020 2118 03/01/20 0500 03/02/20 0432  WBC 6.3  --   --  6.1  --  7.9 9.6  NEUTROABS 4.2  --   --   --   --   --   --  HGB 13.6   < > 7.8* 13.1 12.9* 12.6* 12.8*  HCT 42.1   < > 23.0* 39.3 38.0* 38.3* 38.4*  MCV 100.5*  --   --  95.2  --  96.2 98.0  PLT 161  --   --  125*  --  128* 126*   < > = values in this interval not displayed.    Basic Metabolic Panel: Recent Labs  Lab 03/16/20 1317 March 16, 2020 1317 03-16-20 1321 2020-03-16 1321 2020/03/16 1832 2020/03/16 2118 03/01/20 0500 03/01/20 1632 03/01/20 2211  NA 140   < > 140   < > 147* 139 139 139 141  K 3.7  --  3.6  --  2.4* 4.4 3.7  --   --    CL 103  --  102  --  114*  --  109  --   --   CO2 29  --   --   --   --   --  20*  --   --   GLUCOSE 176*  --  163*  --  90  --  166*  --   --   BUN 26*  --  29*  --  15  --  26*  --   --   CREATININE 1.06  --  1.00  --  0.30*  --  1.03  --   --   CALCIUM 9.3  --   --   --   --   --  8.5*  --   --   MG  --   --   --   --   --   --  1.6*  --   --    < > = values in this interval not displayed.   GFR: Estimated Creatinine Clearance: 59.1 mL/min (by C-G formula based on SCr of 1.03 mg/dL). Recent Labs  Lab 03-16-20 1317 2020/03/16 2007 03/01/20 0500 03/02/20 0432  WBC 6.3 6.1 7.9 9.6    Liver Function Tests: Recent Labs  Lab 2020-03-16 1317 03/01/20 0500  AST 21 17  ALT 23 19  ALKPHOS 82 67  BILITOT 1.3* 1.4*  PROT 6.3* 5.1*  ALBUMIN 3.4* 3.1*   No results for input(s): LIPASE, AMYLASE in the last 168 hours. No results for input(s): AMMONIA in the last 168 hours.  ABG    Component Value Date/Time   PHART 7.412 03-16-20 2118   PCO2ART 37.5 03-16-20 2118   PO2ART 195 (H) 03/16/2020 2118   HCO3 23.8 03/16/2020 2118   TCO2 25 2020/03/16 2118   ACIDBASEDEF 1.0 2020/03/16 2118   O2SAT 100.0 03-16-20 2118     Coagulation Profile: Recent Labs  Lab 03-16-2020 1317 03/01/20 0500  INR 1.4* 1.5*    Cardiac Enzymes: No results for input(s): CKTOTAL, CKMB, CKMBINDEX, TROPONINI in the last 168 hours.  HbA1C: Hgb A1c MFr Bld  Date/Time Value Ref Range Status  03/01/2020 05:00 AM 6.3 (H) 4.8 - 5.6 % Final    Comment:    (NOTE) Pre diabetes:          5.7%-6.4%  Diabetes:              >6.4%  Glycemic control for   <7.0% adults with diabetes     CBG: Recent Labs  Lab 03/01/20 1521 03/01/20 1912 03/01/20 2307 03/02/20 0309 03/02/20 0709  GLUCAP 102* 87 81 98 105*    Review of Systems:   Unable to obtain as patient is intubated  Past Medical History  He,  has a past medical history of Atrial fibrillation (HCC), CAD (coronary artery disease), Carotid  artery disease (HCC), Diabetes mellitus (HCC), Gout, Hyperlipidemia, and Hypertension.   Surgical History    Past Surgical History:  Procedure Laterality Date  . CORONARY STENT PLACEMENT    . PROSTATE SURGERY    . RADIOLOGY WITH ANESTHESIA N/A 02/27/2020   Procedure: IR WITH ANESTHESIA - CODE STROKE;  Surgeon: Radiologist, Medication, MD;  Location: [facility] OR;  Service: Radiology;  Laterality: N/A;  . REPLACEMENT TOTAL KNEE Right   . SPINE SURGERY       Social History   reports that he has been smoking cigars. He does not have any smokeless tobacco history on file.   Family History   His family history includes CAD in his father and mother; CVA in his father.   Allergies Allergies  Allergen Reactions  . Ibuprofen Other (See Comments)    Stomach irritation      Home Medications  Prior to Admission medications   Not on File      CCT: 40 mins  Posey Boyer, ACNP [facility] 03/02/2020, 8:05 AM  See Loretha Stapler for personal pager PCCM on call pager [phone]

## 2020-03-02 NOTE — Progress Notes (Signed)
Pt transported from 4N15 to CT and back with no complications.  

## 2020-03-02 NOTE — Evaluation (Signed)
Occupational Therapy Evaluation Patient Details Name: Richard Farmer MRN: [id number] DOB: Dec 08, 1940 Today's Date: 03/02/2020    History of Present Illness This 79 y.o. male admitted with acute onset Lt sided weakness.  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  CT of head showed large Rt MCA territory infarct, no hemorrhagic transformation, localized edema.  HTN, Gout, DM, CAD, A-Fib, s/p Rt TKA,    Clinical Impression   Pt admitted with above. He demonstrates the below listed deficits and will benefit from continued OT to maximize safety and independence with BADLs.  Pt presents to OT with Lt hemiparesis, impaired cognition, Rt gaze preference with Lt visual neglect, decreased activity tolerance.  Pt currently is total A for all aspects of ADLs and bed mobility.  Pt lives with his wife, but unsure of his PLOF as he is unable to provide this info.  He will likely require extensive post acute rehab, but it's too early to determine if SNF or CIR will be most appropriate.       Follow Up Recommendations  CIR;SNF;Supervision/Assistance - 24 hour (depending upon pt progress )    Equipment Recommendations  None recommended by OT    Recommendations for Other Services Rehab consult     Precautions / Restrictions Precautions Precautions: Fall      Mobility Bed Mobility Overal bed mobility: Needs Assistance Bed Mobility: Rolling Rolling: Total assist         General bed mobility comments: requires assist for all aspects     Transfers                 General transfer comment: unable to attempt this date     Balance                                           ADL either performed or assessed with clinical judgement   ADL Overall ADL's : Needs assistance/impaired Eating/Feeding: NPO   Grooming: Wash/dry hands;Wash/dry face;Oral care;Brushing hair;Total assistance;Bed  level   Upper Body Bathing: Total assistance;Bed level   Lower Body Bathing: Total assistance;Bed level   Upper Body Dressing : Total assistance;Bed level   Lower Body Dressing: Total assistance;Bed level   Toilet Transfer: Total assistance Toilet Transfer Details (indicate cue type and reason): unable to attempt  Toileting- Clothing Manipulation and Hygiene: Total assistance;Bed level         General ADL Comments: Pt unable to to perform      Vision   Additional Comments: Pt unable to provide info  if he wears glasses.  He keeps both eyes closed.  When lids opened, he demonstrated Rt gaze preference.  No attempts noted to track to Lt with max cues and stimuli provided      Perception Perception Perception Tested?: Yes Perception Deficits: Inattention/neglect Inattention/Neglect: Does not attend to left visual field Spatial deficits: pt does attempt to move Lt LE and Lt UE minimally when cued    Praxis Praxis Praxis tested?: Deficits Deficits: Initiation    Pertinent Vitals/Pain Pain Assessment: No/denies pain     Hand Dominance Right   Extremity/Trunk Assessment Upper Extremity Assessment Upper Extremity Assessment: LUE deficits/detail LUE Deficits / Details: small excursions of movement noted Lt UE, but difficult to fully assess.  No hand movement noted.  PROM WFL  LUE Coordination: decreased  fine motor;decreased gross motor   Lower Extremity Assessment Lower Extremity Assessment: Defer to PT evaluation   Cervical / Trunk Assessment Cervical / Trunk Assessment: Other exceptions Cervical / Trunk Exceptions: not fully assessed due to bed level eval    Communication Communication Communication: Expressive difficulties   Cognition Arousal/Alertness: Awake/alert;Lethargic Behavior During Therapy: Flat affect Overall Cognitive Status: Impaired/Different from baseline Area of Impairment: Attention;Following commands;Problem solving                    Current Attention Level: Focused;Sustained   Following Commands: Follows one step commands inconsistently;Follows one step commands with increased time     Problem Solving: Slow processing;Decreased initiation;Difficulty sequencing;Requires verbal cues;Requires tactile cues General Comments: Pt keeps eyes closed.  He is able to answer orientation questions, but demonstrates low volume and difficult to understand him.  He follows one step motor commands consistently.     General Comments  VSS     Exercises Exercises: Other exercises Other Exercises Other Exercises: Pt moved to chair position.  He was unable to move forward into unsupported sitting    Shoulder Instructions      Home Living Family/patient expects to be discharged to:: Unsure                                        Prior Functioning/Environment          Comments: No family present at time of admission, and pt unable to provide info.  Per chart, he lives with his wife         OT Problem List: Decreased strength;Decreased range of motion;Decreased activity tolerance;Impaired balance (sitting and/or standing);Impaired vision/perception;Decreased coordination;Decreased cognition;Decreased safety awareness;Impaired UE functional use;Impaired tone      OT Treatment/Interventions: Self-care/ADL training;Neuromuscular education;Therapeutic exercise;DME and/or AE instruction;Manual therapy;Splinting;Therapeutic activities;Cognitive remediation/compensation;Visual/perceptual remediation/compensation;Patient/family education;Balance training    OT Goals(Current goals can be found in the care plan section) Acute Rehab OT Goals OT Goal Formulation: With patient Time For Goal Achievement: 03/16/20 Potential to Achieve Goals: Good  OT Frequency: Min 2X/week   Barriers to D/C:            Co-evaluation              AM-PAC OT "6 Clicks" Daily Activity     Outcome Measure Help from another person  eating meals?: Total Help from another person taking care of personal grooming?: Total Help from another person toileting, which includes using toliet, bedpan, or urinal?: Total Help from another person bathing (including washing, rinsing, drying)?: Total Help from another person to put on and taking off regular upper body clothing?: Total Help from another person to put on and taking off regular lower body clothing?: Total 6 Click Score: 6   End of Session Equipment Utilized During Treatment: Oxygen Nurse Communication: Mobility status  Activity Tolerance: Patient tolerated treatment well Patient left: in bed;with call bell/phone within reach;with restraints reapplied  OT Visit Diagnosis: Hemiplegia and hemiparesis Hemiplegia - Right/Left: Left Hemiplegia - dominant/non-dominant: Non-Dominant Hemiplegia - caused by: Cerebral infarction                Time: 8841-6606 OT Time Calculation (min): 26 min Charges:  OT General Charges $OT Visit: 1 Visit OT Evaluation $OT Eval Moderate Complexity: 1 Mod  Richard Penrose C., OTR/L Acute Rehabilitation Services Pager [phone] Office [phone]   Jeani Hawking M 03/02/2020, 3:41 PM

## 2020-03-02 NOTE — Progress Notes (Signed)
Pts wife, Darl Pikes would like to be contacted and updated on pts MRI results yesterday (11/25)

## 2020-03-02 NOTE — Progress Notes (Signed)
  Interdisciplinary Goals of Care Family Meeting   Date carried out:: 03/02/2020  Location of the meeting: Conference room  Member's involved: wife, Darl Pikes, two sons (one by phone), and two daughters.  Bedside RN, Dahlia Client, Dr. Corbin Ade, PA, Dr. Merrily Pew and myself  Durable Power of Attorney or acting medical decision maker: wife, Darl Pikes  Discussion: We discussed patient's stroke, goals of care, and answered all family questions for [employer]. Dr. Loreta Ave discussed IR procedure details and reviewed imaging with family.  PCCM discussed next step, which would be trial of extubation.  In the event he fails extubation, family at this time wants to proceed with reintubation if necessary.  Family at this time unsure of long term goals and would need to discuss further in detail if needed.    Code status: Full Code  Disposition: Continue current acute care  Time spent for the meeting: 25 mins    Posey Boyer, ACNP [facility] 03/02/2020, 1:29 PM

## 2020-03-02 NOTE — Progress Notes (Signed)
STROKE TEAM PROGRESS NOTE   INTERVAL HISTORY Patient remains intubated and sedated for respiratory failure.  Serum sodium this morning is 145 and he was started on hypertonic saline yesterday and MRI scan last p.m. had shown large right MCA infarct with cytotoxic edema and slight effacement of the right lateral ventricle but no midline shift.  Follow-up CT scan from this morning shows stable appearance of the infarct and cytotoxic edema.  Blood pressure adequately controlled.  Patient is able to follow simple commands in midline and right side consistently but remains hemiplegic on the left  Vitals:   03/02/20 0739 03/02/20 0800 03/02/20 0900 03/02/20 1000  BP: (!) 198/89 (!) 170/95 (!) 169/117 110/73  Pulse: 89 (!) 47 (!) 46 (!) 52  Resp: 15 16 (!) 23 (!) 21  Temp:  100 F (37.8 C)    TempSrc:  Axillary    SpO2: 100% 100% 100% 97%  Weight:      Height:       CBC:  Recent Labs  Lab 2020-03-13 1317 03/13/2020 1321 03/01/20 0500 03/02/20 0432  WBC 6.3   < > 7.9 9.6  NEUTROABS 4.2  --   --   --   HGB 13.6   < > 12.6* 12.8*  HCT 42.1   < > 38.3* 38.4*  MCV 100.5*   < > 96.2 98.0  PLT 161   < > 128* 126*   < > = values in this interval not displayed.   Basic Metabolic Panel:  Recent Labs  Lab 03/01/20 0500 03/01/20 1632 03/01/20 2211 03/02/20 0432  NA 139   < > 141 145  K 3.7  --   --  3.3*  CL 109  --   --  112*  CO2 20*  --   --  20*  GLUCOSE 166*  --   --  132*  BUN 26*  --   --  21  CREATININE 1.03  --   --  0.90  CALCIUM 8.5*  --   --  8.7*  MG 1.6*  --   --   --    < > = values in this interval not displayed.   Lipid Panel:  Recent Labs  Lab 03/02/20 0432  CHOL 96  TRIG 132  HDL 28*  CHOLHDL 3.4  VLDL 26  LDLCALC 42   HgbA1c:  Recent Labs  Lab 03/01/20 0500  HGBA1C 6.3*   Urine Drug Screen: No results for input(s): LABOPIA, COCAINSCRNUR, LABBENZ, AMPHETMU, THCU, LABBARB in the last 168 hours.  Alcohol Level No results for input(s): ETH in the last 168  hours.  IMAGING past 24 hours CT Head Wo Contrast  Result Date: 03/02/2020 CLINICAL DATA:  Follow-up examination for acute stroke. EXAM: CT HEAD WITHOUT CONTRAST TECHNIQUE: Contiguous axial images were obtained from the base of the skull through the vertex without intravenous contrast. COMPARISON:  Prior CT and MRI from 03/01/2020 FINDINGS: Brain: Continued interval evolution of cytotoxic edema involving a large portion of the right frontal and temporal regions, consistent with evolving right MCA territory infarct. Overall, size and distribution is stable from prior MRI. Involvement of the right basal ganglia again noted. Localized edema with partial effacement of the right lateral ventricle without significant midline shift. No hemorrhagic transformation or other complication. No other new acute large vessel territory infarct or hemorrhage. Underlying atrophy with chronic microvascular ischemic disease again noted. No extra-axial fluid collection. Vascular: No hyperdense vessel. Calcified atherosclerosis present at skull base. Skull: Scalp soft  tissues and calvarium within normal limits. Sinuses/Orbits: Globes orbital soft tissues demonstrate no acute finding. Scattered mucosal thickening noted throughout the paranasal sinuses. Endotracheal and enteric tubes partially visualized. No significant mastoid effusion. Other: None. IMPRESSION: 1. 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 No hemorrhagic transformation or other complication. Localized edema without midline shift. 2. No other new acute intracranial abnormality. Electronically Signed   By: Rise Mu M.D.   On: 03/02/2020 04:52   MR BRAIN WO CONTRAST  Result Date: 03/01/2020 CLINICAL DATA:  Acute right MCA infarction by CT. EXAM: MRI HEAD WITHOUT CONTRAST TECHNIQUE: Multiplanar, multiecho pulse sequences of the brain and surrounding structures were obtained without  intravenous contrast. COMPARISON:  CT same day.  Multiple CT studies done yesterday. FINDINGS: Brain: Diffusion imaging shows acute infarction affecting the right middle cerebral artery territory with sparing of the putamen, globus pallidus and caudate head. Infarction affects the caudate body. Region of cortical and subcortical infarction measures up to 11.5 cm. Mild swelling. Minimal petechial blood products in the region of the infarction. No measurable hematoma. No midline shift. No extra-axial fluid collection. Vascular: Abnormal flow pattern in the right ICA at the base of the brain. There appears to be partial thrombosis or dissection. Skull and upper cervical spine: Negative Sinuses/Orbits: Mucosal inflammatory changes of the paranasal sinuses. Orbits negative. Chronic staphylomas. Other: None IMPRESSION: 1. Acute infarction affecting the right middle cerebral artery territory with sparing of the putamen, globus pallidus and caudate head. Region of cortical and subcortical infarction measures up to 11.5 cm. Minimal petechial blood products in the region of the infarction. No measurable hematoma. No midline shift. 2. Abnormal flow pattern in the right ICA at the base of the brain. There appears to be partial thrombosis or dissection. Electronically Signed   By: Paulina Fusi M.D.   On: 03/01/2020 16:36   ECHOCARDIOGRAM COMPLETE  Result Date: 03/01/2020    ECHOCARDIOGRAM REPORT   Patient Name:   Richard Farmer Date of Exam: 03/01/2020 Medical Rec #:  [id number]   Height:       66.0 in Accession #:    [id number]  Weight:       191.8 lb Date of Birth:  September 25, 1940   BSA:          1.965 m Patient Age:    79 years    BP:           138/67 mmHg Patient Gender: M           HR:           91 bpm. Exam Location:  Inpatient Procedure: 2D Echo, Color Doppler and Cardiac Doppler Indications:    Stroke i163.9  History:        Patient has no prior history of Echocardiogram examinations.                 CAD, Arrythmias:Atrial  Fibrillation; Risk Factors:Hypertension,                 Diabetes and Dyslipidemia.  Sonographer:    Irving Burton Senior RDCS Referring Phys: [id number] [facility] [place]  Sonographer Comments: Scanned supine on artificial respirator. IMPRESSIONS  1. Left ventricular ejection fraction, by estimation, is 60 to 65%. The left ventricle has normal function. The left ventricle has no regional wall motion abnormalities. There is mild left ventricular hypertrophy. Left ventricular diastolic parameters are indeterminate.  2. Right ventricular systolic function is normal. The right ventricular size  is mildly enlarged. There is mildly elevated pulmonary artery systolic pressure. The estimated right ventricular systolic pressure is 37.4 mmHg.  3. Left atrial size was moderately dilated.  4. Right atrial size was moderately dilated.  5. The mitral valve is normal in structure. Trivial mitral valve regurgitation. No evidence of mitral stenosis.  6. Tricuspid valve regurgitation is moderate.  7. The aortic valve is calcified. There is moderate calcification of the aortic valve. There is moderate thickening of the aortic valve. Aortic valve regurgitation is not visualized. Mild aortic valve stenosis. Aortic valve mean gradient measures 15.6 mmHg. Aortic valve Vmax measures 2.56 m/s.  8. The inferior vena cava is dilated in size with >50% respiratory variability, suggesting right atrial pressure of 8 mmHg. FINDINGS  Left Ventricle: Left ventricular ejection fraction, by estimation, is 60 to 65%. The left ventricle has normal function. The left ventricle has no regional wall motion abnormalities. The left ventricular internal cavity size was normal in size. There is  mild left ventricular hypertrophy. Left ventricular diastolic parameters are indeterminate. Right Ventricle: The right ventricular size is mildly enlarged. No increase in right ventricular wall thickness. Right ventricular systolic function is normal. There is mildly elevated  pulmonary artery systolic pressure. The tricuspid regurgitant velocity is 2.71 m/s, and with an assumed right atrial pressure of 8 mmHg, the estimated right ventricular systolic pressure is 37.4 mmHg. Left Atrium: Left atrial size was moderately dilated. Right Atrium: Right atrial size was moderately dilated. Pericardium: There is no evidence of pericardial effusion. Mitral Valve: The mitral valve is normal in structure. Trivial mitral valve regurgitation. No evidence of mitral valve stenosis. Tricuspid Valve: The tricuspid valve is normal in structure. Tricuspid valve regurgitation is moderate . No evidence of tricuspid stenosis. Aortic Valve: The aortic valve is calcified. There is moderate calcification of the aortic valve. There is moderate thickening of the aortic valve. Aortic valve regurgitation is not visualized. Mild aortic stenosis is present. Aortic valve mean gradient measures 15.6 mmHg. Aortic valve peak gradient measures 26.3 mmHg. Aortic valve area, by VTI measures 0.91 cm. Pulmonic Valve: The pulmonic valve was normal in structure. Pulmonic valve regurgitation is not visualized. No evidence of pulmonic stenosis. Aorta: The aortic root is normal in size and structure. Venous: The inferior vena cava is dilated in size with greater than 50% respiratory variability, suggesting right atrial pressure of 8 mmHg. IAS/Shunts: No atrial level shunt detected by color flow Doppler.  LEFT VENTRICLE PLAX 2D LVIDd:         3.70 cm LVIDs:         1.80 cm LV PW:         1.20 cm LV IVS:        1.30 cm LVOT diam:     2.00 cm LV SV:         43 LV SV Index:   22 LVOT Area:     3.14 cm  RIGHT VENTRICLE RV S prime:     8.16 cm/s TAPSE (M-mode): 1.3 cm LEFT ATRIUM             Index       RIGHT ATRIUM           Index LA diam:        5.10 cm 2.60 cm/m  RA Area:     21.30 cm LA Vol (A2C):   83.2 ml 42.35 ml/m RA Volume:   63.50 ml  32.32 ml/m LA Vol (A4C):   90.4 ml 46.02 ml/m  LA Biplane Vol: 88.4 ml 45.00 ml/m  AORTIC  VALVE AV Area (Vmax):    0.85 cm AV Area (Vmean):   0.89 cm AV Area (VTI):     0.91 cm AV Vmax:           256.20 cm/s AV Vmean:          185.400 cm/s AV VTI:            0.476 m AV Peak Grad:      26.3 mmHg AV Mean Grad:      15.6 mmHg LVOT Vmax:         69.13 cm/s LVOT Vmean:        52.700 cm/s LVOT VTI:          0.137 m LVOT/AV VTI ratio: 0.29  AORTA Ao Root diam: 3.70 cm Ao Asc diam:  3.70 cm TRICUSPID VALVE TR Peak grad:   29.4 mmHg TR Vmax:        271.00 cm/s  SHUNTS Systemic VTI:  0.14 m Systemic Diam: 2.00 cm Donato Schultz MD Electronically signed by Donato Schultz MD Signature Date/Time: 03/01/2020/2:44:19 PM    Final     PHYSICAL EXAM   Elderly Caucasian male sedated and intubated not in distress. . Afebrile. Head is nontraumatic. Neck is supple without bruit.    Cardiac exam no murmur or gallop. Lungs are clear to auscultation. Distal pulses are well felt. Neurological Exam : Patient is sedated intubated on propofol drip.  Eyes are closed.  Right pupil is 4 mm irregular postsurgical but reactive.  Left is 3 mm reactive.  Doll's eye movements are sluggish.  He is able to look partially to the right but does not gaze to the left on command.  He blinks to threat more on the right than on the left.  He follows simple midline and right body one-step commands     Fundi not visualized.  No facial weakness.  Tongue midline.  Motor system exam shows spontaneous purposeful right upper and lower extremity movements.  Withdraws left leg briskly.  Left upper extremity is flaccid, hypotonic and does not respond to painful stimulus.  There is semipurposeful movement in the right upper extremity.  Left plantar upgoing right downgoing.  Gait not tested   ASSESSMENT/PLAN Richard Farmer is a 79 y.o. male with history of AF on warfarin, know ICA stenosis presenting with L sided weakness and hemianopsia. He did not receive tPA. Taken to IR for emergent stenting of R ICA occlusion.  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 Small vessel disease. Atrophy. Possible R forehead scalp hematoma/contusion  CTA head & neck R cervical ICA origin occlusion at terminus. Critical stenosis brachiocephalic artery. Severe stenosis R P2. Multifocal moderate R A2, proximal R P2, severe distal R P2, B mid VA, L VA origin stenoses. B ICA atherosclerosis w/ L ICA web. Severe degenerative C6-7 disc dz.  CT perfusion no core. Entire R MCA, MCA/ACA, MCA/PCA watershed territory at risk  Cerebral angio / IR R ICA occlusion from bulb to proximal ICA terminus. Stent R ICA. Attempted thrombectomy intracranial ICA below terminus and R MCA/M1.   Post IR CT completed, results        CT head R MCA infarct. Atrophy. Sinus dz.  MRI  R MCA infarct w/  sparing of putamen, globus pallidus and caudate head. Petechial hemorrhage. No shift. Abnormal flor R ICA w/ partial dissection.  2D Echo EF 60-65%. No source of embolus. LA moderately dilated  LDL 42     HgbA1c 6.3  VTE prophylaxis - Lovenox 40 mg sq daily   aspirin 81 mg daily and warfarin daily prior to admission w/ INR 1.4 on arrival, now on aspirin 81 mg daily and clopidogrel 75 mg daily.   Therapy recommendations:  pending   Disposition:  pending   Dr. Pearlean BrownieSethi had discussion w/ family f/t prognosis, care options  Acute Respiratory Failure  Secondary to stroke  Intubated for IR, remains intubated following  Considering extubation - need direction from family r/t reintubation prior to  CCM on board   Cytotoxic cerebral edema  MRI large right MCA infarct with cytotoxic edema and effacement of right lateral ventricle  Started on 3% at 75/hr via PIV  Goal Na 150 -55  Na 145  If ongoing aggressive care desired by family, will need to place PICC for administration  Atrial Fibrillation.  chronic  Home anticoagulation:  warfarin daily + asa 81  Admission INR 1.4  Last INR 1.5  Not an AC candidate at this time d/t risk hemorrhage and presence of hemorrhagic transformation   Carotid Stenosis   Hx CEA   Hypertension  Home meds:  Coreg 12.5 bid, losartan 50,   Stable . Permissive hypertension (OK if < 220/120) but gradually normalize in 5-7 days . Long-term BP goal normotensive  Hyperlipidemia  Home meds:  lipitor 80 + omega 3  LDL 42, goal < 70  Continue statin at d/c  Diabetes type II Controlled  Home meds:  none  HgbA1c 6.3, goal < 7.0  CBGs Recent Labs    03/01/20 2307 03/02/20 0309 03/02/20 0709  GLUCAP 81 98 105*      SSI  Dysphagia . Secondary to stroke . NPO . Speech on board   Other Stroke Risk Factors  Advanced Age >/= 3465   Cigar smoker, daily  ETOH use 4x wk  Obesity, Body mass index is 30.96 kg/m., BMI >/= 30 associated with increased stroke risk, recommend weight loss, diet and exercise as appropriate   Family hx stroke (father)  Coronary artery disease s/p stent  Other Active Problems  Gout on allopurinal 300 daily  GERD on prilosec  BPH on flomax  Thrombocytopenia PLT 126  Hypokalemia K 2.4->4.4->3.7->3.3  Hypomagnesemia Mg 1.6  Febrile Tmax 100.6. CCM following  Hospital day # 2  Plan continue aspirin and Plavix for carotid occlusion and slight permissive hypertension.  Continue hypertonic saline with serum sodium goal 150-155.  He may need PICC line insertion if family chooses to continue aggressive care.  Insert core track tube for nutrition and feeds.  Trial of extubation if patient's mean weaning criteria.  Family needs to decide about one-way extubation versus DNR prior to extubation.  Long discussion with patient's wife and son over the phone about his prognosis, plan of care and answered questions.  Discussed with Dr. Merrily Pewhand critical care medicine. This patient is critically ill and at significant  risk of neurological worsening, death and care requires constant monitoring of vital signs, hemodynamics,respiratory and cardiac monitoring, extensive review of multiple databases, frequent neurological assessment, discussion with family, other specialists and medical decision making of high complexity.I have made any additions or clarifications directly to the above note.This critical care time does not reflect procedure time, or teaching time or supervisory time of  PA/NP/Med Resident etc but could involve care discussion time.  I spent 35 minutes of neurocritical care time  in the care of  this patient. Delia Heady, MD    To contact Stroke Continuity provider, please refer to [email or web address]. After hours, contact General Neurology

## 2020-03-02 NOTE — Progress Notes (Signed)
Referring Physician(s): CODE STROKE  Supervising Physician: Gilmer Mor  Patient Status:  [facility] - In-pt  Chief Complaint: R ICA occlusion  Subjective: Intubated/sedated but becomes slightly agitated to our presence in the room. Does not open eyes.  Movement observed on the right today.  Procedure site intact.  S/p CT and MRI yesterday.   Allergies: Ibuprofen  Medications: Prior to Admission medications   Not on File     Vital Signs: BP (!) 170/95 (BP Location: Right Arm) Comment: per MD sethi, go by cuff  Pulse (!) 47   Temp 100 F (37.8 C) (Axillary) Comment: prn tyl given  Resp 16   Ht 5\' 6"  (1.676 m)   Wt 191 lb 12.8 oz (87 kg)   SpO2 100%   BMI 30.96 kg/m   Physical Exam  Intubate/sedated Neuro: clouded by sedation, moving right side purposefully. No movement noted on left today. Does not open eyes or respond to commands. Groin: intact, soft.  No evidence of hematoma or pseudoaneurysm. Pulses: distal pulses intact, palpable today.   Imaging: CT Head Wo Contrast  Result Date: 03/02/2020 CLINICAL DATA:  Follow-up examination for acute stroke. EXAM: CT HEAD WITHOUT CONTRAST TECHNIQUE: Contiguous axial images were obtained from the base of the skull through the vertex without intravenous contrast. COMPARISON:  Prior CT and MRI from 03/01/2020 FINDINGS: Brain: Continued interval evolution of cytotoxic edema involving a large portion of the right frontal and temporal regions, consistent with evolving right MCA territory infarct. Overall, size and distribution is stable from prior MRI. Involvement of the right basal ganglia again noted. Localized edema with partial effacement of the right lateral ventricle without significant midline shift. No hemorrhagic transformation or other complication. No other new acute large vessel territory infarct or hemorrhage. Underlying atrophy with chronic microvascular ischemic disease again noted. No extra-axial fluid collection.  Vascular: No hyperdense vessel. Calcified atherosclerosis present at skull base. Skull: Scalp soft tissues and calvarium within normal limits. Sinuses/Orbits: Globes orbital soft tissues demonstrate no acute finding. Scattered mucosal thickening noted throughout the paranasal sinuses. Endotracheal and enteric tubes partially visualized. No significant mastoid effusion. Other: None. IMPRESSION: 1. 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 No hemorrhagic transformation or other complication. Localized edema without midline shift. 2. No other new acute intracranial abnormality. Electronically Signed   By: 03/03/2020 M.D.   On: 03/02/2020 04:52   CT HEAD WO CONTRAST  Result Date: 03/01/2020 CLINICAL DATA:  Stroke, follow-up. EXAM: CT HEAD WITHOUT CONTRAST TECHNIQUE: Contiguous axial images were obtained from the base of the skull through the vertex without intravenous contrast. COMPARISON:  02/22/2020 head CT. FINDINGS: Brain: Ill-defined hypodense region and blurring of the gray-white junctions involving the right MCA territory. No intracranial hemorrhage. No mass lesion. No midline shift, ventriculomegaly or extra-axial fluid collection. Mild cerebral atrophy with ex vacuo dilatation. Vascular: No hyperdense vessel or unexpected calcification. Bilateral skull base atherosclerotic calcifications. Skull: Negative for fracture or focal lesion. Sinuses/Orbits: Normal orbits. Minimal pansinus mucosal thickening with trace layering maxillary sinus secretions. Pneumatized mastoid air cells. Other: None. IMPRESSION: 1. Acute right MCA territory infarct. 2. Mild cerebral atrophy. 3. Mild pansinus disease. Trace layering secretions may reflect active inflammation. These results will be called to the ordering clinician or representative by the Radiologist Assistant, and communication documented in the PACS or 03/02/2020. Electronically  Signed   By: [employer] M.D.   On: 03/01/2020 06:05   MR BRAIN  WO CONTRAST  Result Date: 03/01/2020 CLINICAL DATA:  Acute right MCA infarction by CT. EXAM: MRI HEAD WITHOUT CONTRAST TECHNIQUE: Multiplanar, multiecho pulse sequences of the brain and surrounding structures were obtained without intravenous contrast. COMPARISON:  CT same day.  Multiple CT studies done yesterday. FINDINGS: Brain: Diffusion imaging shows acute infarction affecting the right middle cerebral artery territory with sparing of the putamen, globus pallidus and caudate head. Infarction affects the caudate body. Region of cortical and subcortical infarction measures up to 11.5 cm. Mild swelling. Minimal petechial blood products in the region of the infarction. No measurable hematoma. No midline shift. No extra-axial fluid collection. Vascular: Abnormal flow pattern in the right ICA at the base of the brain. There appears to be partial thrombosis or dissection. Skull and upper cervical spine: Negative Sinuses/Orbits: Mucosal inflammatory changes of the paranasal sinuses. Orbits negative. Chronic staphylomas. Other: None IMPRESSION: 1. Acute infarction affecting the right middle cerebral artery territory with sparing of the putamen, globus pallidus and caudate head. Region of cortical and subcortical infarction measures up to 11.5 cm. Minimal petechial blood products in the region of the infarction. No measurable hematoma. No midline shift. 2. Abnormal flow pattern in the right ICA at the base of the brain. There appears to be partial thrombosis or dissection. Electronically Signed   By: Paulina Fusi M.D.   On: 03/01/2020 16:36   CT CEREBRAL PERFUSION W CONTRAST  Result Date: Mar 01, 2020 CLINICAL DATA:  Left-sided weakness EXAM: CT PERFUSION BRAIN TECHNIQUE: Multiphase CT imaging of the brain was performed following IV bolus contrast injection. Subsequent parametric perfusion maps were calculated using RAPID software. CONTRAST:   77mL OMNIPAQUE IOHEXOL 350 MG/ML SOLN COMPARISON:  None. FINDINGS: CT Brain Perfusion Findings: CBF (<30%) Volume: 35mL Perfusion (Tmax>6.0s) volume: Mismatch Volume: 06/01/2046mL ASPECTS on noncontrast CT Head: 10 at 1:19 p.m. today. Infarct Core: 0 mL Infarction Location: Right MCA territory and watershed territories. IMPRESSION: No core infarction. Large area of territory at risk throughout the right MCA territory as well as MCA/ACA and MCA/PCA watershed territories. Electronically Signed   By: Guadlupe Spanish M.D.   On: 2020/03/01 13:55   DG Chest Port 1 View  Result Date: 03/01/20 CLINICAL DATA:  Stroke-like symptoms. EXAM: PORTABLE CHEST 1 VIEW COMPARISON:  May 23, 2019 FINDINGS: An endotracheal tube is seen with its distal tip approximately 5.7 cm from the carina. A nasogastric tube is present. Its distal end extends below the level of the diaphragm. The cardiac silhouette is mildly enlarged. There is moderate severity calcification of the aortic arch. Degenerative changes seen throughout the thoracic spine. IMPRESSION: 1. Endotracheal tube and nasogastric tube in good position. 2. No acute infiltrate. Electronically Signed   By: Aram Candela M.D.   On: 01-Mar-2020 20:26   DG Abd Portable 1V  Result Date: 2020-03-01 CLINICAL DATA:  Gastric tube placement EXAM: PORTABLE ABDOMEN - 1 VIEW COMPARISON:  None FINDINGS: Gastric tube in the proximal stomach. Side port likely just above the GE junction. Basilar atelectasis. No effusion. Paucity of gas in the abdomen. Excreted contrast in the collecting systems of bilateral kidneys. No acute skeletal process to the extent evaluated. IMPRESSION: Gastric tube in the proximal stomach with side port likely just above the GE junction. Consider advancing a few more centimeters to ensure intragastric positioning. These results will be called to the ordering clinician or representative by the Radiologist Assistant, and communication documented in the  PACS or [employer]. Electronically Signed   By: Jason Fila.D.  On: Mar 11, 2020 21:23   ECHOCARDIOGRAM COMPLETE  Result Date: 03/01/2020    ECHOCARDIOGRAM REPORT   Patient Name:   Richard Farmer Date of Exam: 03/01/2020 Medical Rec #:  [id number]   Height:       66.0 in Accession #:    [id number]  Weight:       191.8 lb Date of Birth:  [birth year]   BSA:          1.965 m Patient Age:    79 years    BP:           138/67 mmHg Patient Gender: M           HR:           91 bpm. Exam Location:  Inpatient Procedure: 2D Echo, Color Doppler and Cardiac Doppler Indications:    Stroke i163.9  History:        Patient has no prior history of Echocardiogram examinations.                 CAD, Arrythmias:Atrial Fibrillation; Risk Factors:Hypertension,                 Diabetes and Dyslipidemia.  Sonographer:    Irving Burton Senior RDCS Referring Phys: [id number] [facility]  Sonographer Comments: Scanned supine on artificial respirator. IMPRESSIONS  1. Left ventricular ejection fraction, by estimation, is 60 to 65%. The left ventricle has normal function. The left ventricle has no regional wall motion abnormalities. There is mild left ventricular hypertrophy. Left ventricular diastolic parameters are indeterminate.  2. Right ventricular systolic function is normal. The right ventricular size is mildly enlarged. There is mildly elevated pulmonary artery systolic pressure. The estimated right ventricular systolic pressure is 37.4 mmHg.  3. Left atrial size was moderately dilated.  4. Right atrial size was moderately dilated.  5. The mitral valve is normal in structure. Trivial mitral valve regurgitation. No evidence of mitral stenosis.  6. Tricuspid valve regurgitation is moderate.  7. The aortic valve is calcified. There is moderate calcification of the aortic valve. There is moderate thickening of the aortic valve. Aortic valve regurgitation is not visualized. Mild aortic valve stenosis. Aortic valve mean gradient measures  15.6 mmHg. Aortic valve Vmax measures 2.56 m/s.  8. The inferior vena cava is dilated in size with >50% respiratory variability, suggesting right atrial pressure of 8 mmHg. FINDINGS  Left Ventricle: Left ventricular ejection fraction, by estimation, is 60 to 65%. The left ventricle has normal function. The left ventricle has no regional wall motion abnormalities. The left ventricular internal cavity size was normal in size. There is  mild left ventricular hypertrophy. Left ventricular diastolic parameters are indeterminate. Right Ventricle: The right ventricular size is mildly enlarged. No increase in right ventricular wall thickness. Right ventricular systolic function is normal. There is mildly elevated pulmonary artery systolic pressure. The tricuspid regurgitant velocity is 2.71 m/s, and with an assumed right atrial pressure of 8 mmHg, the estimated right ventricular systolic pressure is 37.4 mmHg. Left Atrium: Left atrial size was moderately dilated. Right Atrium: Right atrial size was moderately dilated. Pericardium: There is no evidence of pericardial effusion. Mitral Valve: The mitral valve is normal in structure. Trivial mitral valve regurgitation. No evidence of mitral valve stenosis. Tricuspid Valve: The tricuspid valve is normal in structure. Tricuspid valve regurgitation is moderate . No evidence of tricuspid stenosis. Aortic Valve: The aortic valve is calcified. There is moderate calcification of the aortic valve. There is moderate thickening of the aortic valve. Aortic  valve regurgitation is not visualized. Mild aortic stenosis is present. Aortic valve mean gradient measures 15.6 mmHg. Aortic valve peak gradient measures 26.3 mmHg. Aortic valve area, by VTI measures 0.91 cm. Pulmonic Valve: The pulmonic valve was normal in structure. Pulmonic valve regurgitation is not visualized. No evidence of pulmonic stenosis. Aorta: The aortic root is normal in size and structure. Venous: The inferior vena cava  is dilated in size with greater than 50% respiratory variability, suggesting right atrial pressure of 8 mmHg. IAS/Shunts: No atrial level shunt detected by color flow Doppler.  LEFT VENTRICLE PLAX 2D LVIDd:         3.70 cm LVIDs:         1.80 cm LV PW:         1.20 cm LV IVS:        1.30 cm LVOT diam:     2.00 cm LV SV:         43 LV SV Index:   22 LVOT Area:     3.14 cm  RIGHT VENTRICLE RV S prime:     8.16 cm/s TAPSE (M-mode): 1.3 cm LEFT ATRIUM             Index       RIGHT ATRIUM           Index LA diam:        5.10 cm 2.60 cm/m  RA Area:     21.30 cm LA Vol (A2C):   83.2 ml 42.35 ml/m RA Volume:   63.50 ml  32.32 ml/m LA Vol (A4C):   90.4 ml 46.02 ml/m LA Biplane Vol: 88.4 ml 45.00 ml/m  AORTIC VALVE AV Area (Vmax):    0.85 cm AV Area (Vmean):   0.89 cm AV Area (VTI):     0.91 cm AV Vmax:           256.20 cm/s AV Vmean:          185.400 cm/s AV VTI:            0.476 m AV Peak Grad:      26.3 mmHg AV Mean Grad:      15.6 mmHg LVOT Vmax:         69.13 cm/s LVOT Vmean:        52.700 cm/s LVOT VTI:          0.137 m LVOT/AV VTI ratio: 0.29  AORTA Ao Root diam: 3.70 cm Ao Asc diam:  3.70 cm TRICUSPID VALVE TR Peak grad:   29.4 mmHg TR Vmax:        271.00 cm/s  SHUNTS Systemic VTI:  0.14 m Systemic Diam: 2.00 cm Donato Schultz MD Electronically signed by Donato Schultz MD Signature Date/Time: 03/01/2020/2:44:19 PM    Final    CT HEAD CODE STROKE WO CONTRAST  Result Date: 03-19-20 CLINICAL DATA:  Code stroke. 79 year old male with left side weakness. EXAM: CT HEAD WITHOUT CONTRAST TECHNIQUE: Contiguous axial images were obtained from the base of the skull through the vertex without intravenous contrast. COMPARISON:  None. FINDINGS: Brain: No midline shift, mass effect, or evidence of intracranial mass lesion. No acute intracranial hemorrhage identified. No ventriculomegaly. Patchy mild to moderate for age bilateral cerebral white matter hypodensity. No cortically based acute infarct identified. Vascular:  Calcified atherosclerosis at the skull base. No suspicious intracranial vascular hyperdensity. Skull: Degenerative changes in the visible upper cervical spine. No acute osseous abnormality identified. Sinuses/Orbits: Visualized paranasal sinuses and mastoids are stable and well pneumatized. Other:  There is some rightward gaze. No other acute orbit finding. Possible right forehead scalp hematoma or contusion on series 4, image 49. Underlying calvarium intact. ASPECTS [facility] Stroke Program Early CT Score) - Ganglionic level infarction (caudate, lentiform nuclei, internal capsule, insula, M1-M3 cortex): - Supraganglionic infarction (M4-M6 cortex): Total score (0-10 with 10 being normal): IMPRESSION: 1. No acute cortically based infarct or acute intracranial hemorrhage identified. ASPECTS 10. Mild to moderate for age cerebral white matter changes. 2. Possible right forehead scalp hematoma or contusion. No underlying skull fracture. 3. These results were communicated to Dr.Khaliqdina at 1:29 pm on 02/18/2020 by text page via the [facility] messaging system. Electronically Signed   By: Odessa Fleming M.D.   On: 02/25/2020 13:29   CT ANGIO HEAD CODE STROKE  Result Date: 03/02/2020 CLINICAL DATA:  Stroke/TIA.  Left-sided weakness. EXAM: CT ANGIOGRAPHY HEAD AND NECK TECHNIQUE: Multidetector CT imaging of the head and neck was performed using the standard protocol during bolus administration of intravenous contrast. Multiplanar CT image reconstructions and MIPs were obtained to evaluate the vascular anatomy. Carotid stenosis measurements (when applicable) are obtained utilizing NASCET criteria, using the distal internal carotid diameter as the denominator. CONTRAST:  75mL OMNIPAQUE IOHEXOL 350 MG/ML SOLN COMPARISON:  Same day CT head. 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 Right carotid system: There is calcific and noncalcific atherosclerosis at the carotid  bifurcation. 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 Non opacification of the remainder of the ICA in the neck. Left carotid system: Calcific atherosclerosis of the common carotid artery. There is calcified and noncalcified atherosclerosis at the bifurcation with a web (see series 6, image 225), but without greater than 50% narrowing. Vertebral arteries: Mildly left dominant. Moderate stenosis of the left vertebral artery origin. Otherwise, no hemodynamically significant stenosis in the neck. 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 Upper chest: No acute abnormality. Review of the MIP images confirms the above findings CTA HEAD FINDINGS Anterior circulation: Occlusion of the right internal carotid artery to the level of the carotid terminus. The right MCA and ACA branches are well opacified proximally. There is moderate stenosis of the right A2 ACA, likely related to atherosclerosis. Posterior circulation: Moderate stenosis of bilateral intradural vertebral arteries secondary to calcific atherosclerosis. Left fetal type PCA. Moderate stenosis of the proximal right P2 PCA with additional severe stenosis of the more distal P2 PCA. Venous sinuses: As permitted by contrast timing, patent. Review of the MIP images confirms the above findings IMPRESSION: 1. 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 The right MCA and ACA branches are opacified. 2. 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 3. Multifocal moderate atherosclerotic stenoses, including the right A2 ACA, proximal right P2 PCA (in addition to severe distal stenosis), bilateral intradural vertebral arteries, and left vertebral artery origin. 4. 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 5. Severe  degenerative disc disease at C6-C7 and at the craniocervical junction. Conclusion #1 was discussed with Dr. Derry Lory at 1:42 PM via telephone. Electronically Signed   By: Feliberto Harts MD   On: 03/03/2020 14:02   CT ANGIO NECK CODE STROKE  Result Date: 02/11/2020 CLINICAL DATA:  Stroke/TIA.  Left-sided weakness. EXAM: CT ANGIOGRAPHY HEAD AND NECK TECHNIQUE: Multidetector CT imaging of the  head and neck was performed using the standard protocol during bolus administration of intravenous contrast. Multiplanar CT image reconstructions and MIPs were obtained to evaluate the vascular anatomy. Carotid stenosis measurements (when applicable) are obtained utilizing NASCET criteria, using the distal internal carotid diameter as the denominator. CONTRAST:  75mL OMNIPAQUE IOHEXOL 350 MG/ML SOLN COMPARISON:  Same day CT head. 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 Right carotid system: There is calcific and noncalcific atherosclerosis at the carotid bifurcation. 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 Non opacification of the remainder of the ICA in the neck. Left carotid system: Calcific atherosclerosis of the common carotid artery. There is calcified and noncalcified atherosclerosis at the bifurcation with a web (see series 6, image 225), but without greater than 50% narrowing. Vertebral arteries: Mildly left dominant. Moderate stenosis of the left vertebral artery origin. Otherwise, no hemodynamically significant stenosis in the neck. 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 Upper chest: No acute abnormality. Review of the MIP images confirms the above findings CTA HEAD FINDINGS Anterior circulation: Occlusion of the right internal carotid artery to the level of the carotid terminus. The right MCA and ACA branches are well  opacified proximally. There is moderate stenosis of the right A2 ACA, likely related to atherosclerosis. Posterior circulation: Moderate stenosis of bilateral intradural vertebral arteries secondary to calcific atherosclerosis. Left fetal type PCA. Moderate stenosis of the proximal right P2 PCA with additional severe stenosis of the more distal P2 PCA. Venous sinuses: As permitted by contrast timing, patent. Review of the MIP images confirms the above findings IMPRESSION: 1. 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 The right MCA and ACA branches are opacified. 2. 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 3. Multifocal moderate atherosclerotic stenoses, including the right A2 ACA, proximal right P2 PCA (in addition to severe distal stenosis), bilateral intradural vertebral arteries, and left vertebral artery origin. 4. 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 5. 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 Conclusion #1 was discussed with Dr. Derry LoryKhaliqdina at 1:42 PM via telephone. Electronically Signed   By: Feliberto HartsFrederick S Jones MD   On: 2019-09-01 14:02    Labs:  CBC: Recent Labs    Aug 29, 2019 1317 Aug 29, 2019 1321 Aug 29, 2019 2007 Aug 29, 2019 2118 03/01/20 0500 03/02/20 0432  WBC 6.3  --  6.1  --  7.9 9.6  HGB 13.6   < > 13.1 12.9* 12.6* 12.8*  HCT 42.1   < > 39.3 38.0* 38.3* 38.4*  PLT 161  --  125*  --  128* 126*   < > = values in this interval not displayed.    COAGS: Recent Labs    Aug 29, 2019 1317 03/01/20 0500  INR 1.4* 1.5*  APTT 30  --     BMP: Recent Labs    Aug 29, 2019 1317 Aug 29, 2019 1317 Aug 29, 2019 1321 Aug 29, 2019 1321 Aug 29, 2019 1832 Aug 29, 2019 1832 Aug 29, 2019 2118 Aug 29, 2019 2118 03/01/20 0500 03/01/20 1632 03/01/20 2211 03/02/20 0432  NA 140   < > 140   < > 147*   < > 139   < > 139 139 141 145  K 3.7   < > 3.6    < > 2.4*  --  4.4  --  3.7  --   --  3.3*  CL 103   < > 102  --  114*  --   --   --  109  --   --  112*  CO2 29  --   --   --   --   --   --   --  20*  --   --  20*  GLUCOSE 176*   < > 163*  --  90  --   --   --  166*  --   --  132*  BUN 26*   < > 29*  --  15  --   --   --  26*  --   --  21  CALCIUM 9.3  --   --   --   --   --   --   --  8.5*  --   --  8.7*  CREATININE 1.06   < > 1.00  --  0.30*  --   --   --  1.03  --   --  0.90  GFRNONAA >60  --   --   --   --   --   --   --  >60  --   --  >60   < > = values in this interval not displayed.    LIVER FUNCTION TESTS: Recent Labs    Mar 16, 2020 1317 03/01/20 0500  BILITOT 1.3* 1.4*  AST 21 17  ALT 23 19  ALKPHOS 82 67  PROT 6.3* 5.1*  ALBUMIN 3.4* 3.1*    Assessment and Plan: PTA and stenting of the right ICA, 15mm-7mm Xact stent, presuming a permissive lesion of the bifurcation Attempted thrombectomy of the intracranial ICA below terminus and of the right MCA/M1 (embolism new territory), unsuccesful Patient s/p unsuccessful attempt to revascularize intermittently symptomatic right ICA occlusion.  Now with R MCA infarct.   MR overnight showed: 1. Acute infarction affecting the right middle cerebral artery territory with sparing of the putamen, globus pallidus and caudate head. Region of cortical and subcortical infarction measures up to 11.5 cm. Minimal petechial blood products in the region of the infarction. No measurable hematoma. No midline shift. 2. Abnormal flow pattern in the right ICA at the base of the brain. There appears to be partial thrombosis or dissection.  Remains intubated/sedated.  Moving right side only today during assessment, but is on sedation medication. Procedure site intact. No evidence of pseudoaneurysm or hematoma.  Plans per Neuro and CCM.  Dual antiplatelet per discretion of Neuro, however per Dr. Loreta Ave either aspirin or Plavix alone may be sufficient once anticoagulation restarted.  IR available if  needed.   Electronically Signed: Hoyt Koch, PA 03/02/2020, 10:18 AM   I spent a total of 15 Minutes at the the patient's bedside AND on the patient's hospital floor or unit, greater than 50% of which was counseling/coordinating care for R ICA occlusion, R MCA infarct

## 2020-03-02 NOTE — Procedures (Signed)
Extubation Procedure Note  Patient Details:   Name: Richard Farmer DOB: Oct 21, 1940 MRN: [id number]   Airway Documentation:   Patient extubated per orders. Patient had positive cuff leak prior to extubation. Placed on 4lpm nasal cannula. RN at bedside. Will continue to monitor. Vent end date: 03/02/20 Vent end time: 1302   Evaluation  O2 sats: stable throughout Complications: No apparent complications Patient did tolerate procedure well. Bilateral Breath Sounds: Diminished   Yes  Suszanne Conners 03/02/2020, 1:03 PM

## 2020-03-02 NOTE — Evaluation (Signed)
Physical Therapy Evaluation Patient Details Name: Richard Farmer MRN: [id number] DOB: 1941/03/09 Today's Date: 03/02/2020   History of Present Illness  This 79 y.o. male admitted with acute onset Lt sided weakness.  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  CT of head showed large Rt MCA territory infarct, no hemorrhagic transformation, localized edema.  HTN, Gout, DM, CAD, A-Fib, s/p Rt TKA,   Clinical Impression  Pt admitted with/for acute/fluctuating L sided weakness, revascularization unsuccessful, Imaging showed large R MCA infarct.  Limited evaluation today, but expect mobility to require total assist of 2 persons.  Pt currently limited functionally due to the problems listed. ( See problems list.)   Pt will benefit from PT to maximize function and safety in order to get ready for next venue listed below.     Follow Up Recommendations CIR;SNF;Other (comment) (post-acute)    Equipment Recommendations  Other (comment) (TBD)    Recommendations for Other Services Rehab consult     Precautions / Restrictions Precautions Precautions: Fall      Mobility  Bed Mobility Overal bed mobility: Needs Assistance Bed Mobility: Rolling Rolling: Total assist         General bed mobility comments: requires assist for all aspects     Transfers                 General transfer comment: unable to attempt this date   Ambulation/Gait             General Gait Details: NT  Stairs            Wheelchair Mobility    Modified Rankin (Stroke Patients Only) Modified Rankin (Stroke Patients Only) Pre-Morbid Rankin Score: No symptoms Modified Rankin: Severe disability     Balance                                             Pertinent Vitals/Pain Pain Assessment: No/denies pain    Home Living Family/patient expects to be discharged to:: Unsure                       Prior Function           Comments: No family present at time of admission, and pt unable to provide info.  Per chart, he lives with his wife      Hand Dominance   Dominant Hand: Right    Extremity/Trunk Assessment   Upper Extremity Assessment Upper Extremity Assessment: Defer to OT evaluation LUE Deficits / Details: small excursions of movement noted Lt UE, but difficult to fully assess.  No hand movement noted.  PROM WFL  LUE Coordination: decreased fine motor;decreased gross motor    Lower Extremity Assessment Lower Extremity Assessment: RLE deficits/detail;LLE deficits/detail RLE Deficits / Details: mild weakness and stiffness, but functional LLE Deficits / Details: trace flexion assist and gross extension  LLE Coordination: decreased fine motor;decreased gross motor    Cervical / Trunk Assessment Cervical / Trunk Assessment: Other exceptions Cervical / Trunk Exceptions: not fully assessed due to bed level eval   Communication   Communication: Expressive difficulties  Cognition Arousal/Alertness: Awake/alert;Lethargic Behavior During Therapy: Flat affect Overall Cognitive Status: Impaired/Different from baseline Area of Impairment: Attention;Following commands;Problem solving  Current Attention Level: Focused;Sustained   Following Commands: Follows one step commands inconsistently;Follows one step commands with increased time     Problem Solving: Slow processing;Decreased initiation;Difficulty sequencing;Requires verbal cues;Requires tactile cues General Comments: Pt keeps eyes closed.  He is able to answer orientation questions, but demonstrates low volume and difficult to understand him.  He follows one step motor commands consistently.        General Comments General comments (skin integrity, edema, etc.): vss    Exercises Other Exercises Other Exercises: Pt moved to chair position.  He was unable to move forward into  unsupported sitting  Other Exercises: AA/PROM to Bil LE   Assessment/Plan    PT Assessment Patient needs continued PT services  PT Problem List Decreased strength;Decreased activity tolerance;Decreased balance;Decreased mobility;Decreased coordination;Decreased cognition;Decreased safety awareness       PT Treatment Interventions DME instruction;Functional mobility training;Therapeutic activities;Therapeutic exercise;Balance training;Neuromuscular re-education;Patient/family education;Other (comment) (pre gait)    PT Goals (Current goals can be found in the Care Plan section)  Acute Rehab PT Goals PT Goal Formulation: Patient unable to participate in goal setting Time For Goal Achievement: 03/16/20 Potential to Achieve Goals: Fair    Frequency Min 3X/week   Barriers to discharge        Co-evaluation PT/OT/SLP Co-Evaluation/Treatment: Yes Reason for Co-Treatment: Complexity of the patient's impairments (multi-system involvement) PT goals addressed during session: Mobility/safety with mobility OT goals addressed during session: ADL's and self-care       AM-PAC PT "6 Clicks" Mobility  Outcome Measure Help needed turning from your back to your side while in a flat bed without using bedrails?: Total Help needed moving from lying on your back to sitting on the side of a flat bed without using bedrails?: Total Help needed moving to and from a bed to a chair (including a wheelchair)?: Total Help needed standing up from a chair using your arms (e.g., wheelchair or bedside chair)?: Total Help needed to walk in hospital room?: Total Help needed climbing 3-5 steps with a railing? : Total 6 Click Score: 6    End of Session   Activity Tolerance: Patient tolerated treatment well Patient left: in bed;with call bell/phone within reach;with bed alarm set Nurse Communication: Mobility status PT Visit Diagnosis: Hemiplegia and hemiparesis Hemiplegia - Right/Left: Left Hemiplegia -  dominant/non-dominant: Non-dominant Hemiplegia - caused by: Cerebral infarction    Time: 1430-1453 PT Time Calculation (min) (ACUTE ONLY): 23 min   Charges:   PT Evaluation $PT Eval High Complexity: 1 High          03/02/2020  Jacinto Halim., PT Acute Rehabilitation Services [phone]  (pager) [phone]  (office)  Eliseo Gum Deanza Upperman 03/02/2020, 5:59 PM

## 2020-03-02 NOTE — Progress Notes (Signed)
Initial Nutrition Assessment  DOCUMENTATION CODES:   Not applicable  INTERVENTION:   Initiate tube feeding via Cortrak: Osmolite 1.2 at 65 ml/h (1560 ml per day) Prosource TF 45 ml daily   Provides 1912 kcal, 97 gm protein, 1265 ml free water daily   NUTRITION DIAGNOSIS:   Inadequate oral intake related to inability to eat as evidenced by NPO status.  GOAL:   Patient will meet greater than or equal to 90% of their needs  MONITOR:   TF tolerance  REASON FOR ASSESSMENT:   Consult, Ventilator Enteral/tube feeding initiation and management  ASSESSMENT:   Pt with PMH of Afib, CAD, carotid stenosis, DM, HLD, and HTN admitted with R MCA stroke s/p attempted thrombectomy and stent placement.   11/26 cortrak placed; tip gastric. Plan for extubation later today  Medications reviewed and include: colace, SSI, miralax  Hypertonic saline  Labs reviewed: Na 146, K+ 3.3    Diet Order:   Diet Order            Diet NPO time specified  Diet effective now                 EDUCATION NEEDS:   No education needs have been identified at this time  Skin:  Skin Assessment: Reviewed RN Assessment  Last BM:  unknown  Height:   Ht Readings from Last 1 Encounters:  02/27/2020 5\' 6"  (1.676 m)    Weight:   Wt Readings from Last 1 Encounters:  02/15/2020 87 kg    Ideal Body Weight:  64.5 kg  BMI:  Body mass index is 30.96 kg/m.  Estimated Nutritional Needs:   Kcal:  1900-2100  Protein:  85-100 grams  Fluid:  2 L/day  03/02/20., RD, LDN, CNSC See AMiON for contact information

## 2020-03-02 NOTE — Procedures (Signed)
Cortrak ° °Tube Type:  Cortrak - 43 inches °Tube Location:  Left nare °Initial Placement:  Stomach °Secured by: Bridle °Technique Used to Measure Tube Placement:  Documented cm marking at nare/ corner of mouth °Cortrak Secured At:  70 cm ° ° ° °Cortrak Tube Team Note: ° °Consult received to place a Cortrak feeding tube.  ° °No x-ray is required. RN may begin using tube.  ° °If the tube becomes dislodged please keep the tube and contact the Cortrak team at [email or web address] (password [email or web address]) for replacement.  °If after hours and replacement cannot be delayed, place a NG tube and confirm placement with an abdominal x-ray.  ° ° °Richard Cadenas MS, RD, LDN °Please refer to AMION for RD and/or RD on-call/weekend/after hours pager ° °

## 2020-03-03 ENCOUNTER — Inpatient Hospital Stay (HOSPITAL_COMMUNITY): Payer: Medicare Other

## 2020-03-03 DIAGNOSIS — R1312 Dysphagia, oropharyngeal phase: Secondary | ICD-10-CM

## 2020-03-03 DIAGNOSIS — I10 Essential (primary) hypertension: Secondary | ICD-10-CM

## 2020-03-03 DIAGNOSIS — I63429 Cerebral infarction due to embolism of unspecified anterior cerebral artery: Secondary | ICD-10-CM | POA: Diagnosis not present

## 2020-03-03 DIAGNOSIS — I63511 Cerebral infarction due to unspecified occlusion or stenosis of right middle cerebral artery: Secondary | ICD-10-CM

## 2020-03-03 DIAGNOSIS — R509 Fever, unspecified: Secondary | ICD-10-CM

## 2020-03-03 DIAGNOSIS — I63131 Cerebral infarction due to embolism of right carotid artery: Secondary | ICD-10-CM

## 2020-03-03 DIAGNOSIS — I482 Chronic atrial fibrillation, unspecified: Secondary | ICD-10-CM

## 2020-03-03 DIAGNOSIS — E876 Hypokalemia: Secondary | ICD-10-CM

## 2020-03-03 DIAGNOSIS — G936 Cerebral edema: Secondary | ICD-10-CM

## 2020-03-03 DIAGNOSIS — E7800 Pure hypercholesterolemia, unspecified: Secondary | ICD-10-CM

## 2020-03-03 DIAGNOSIS — F17200 Nicotine dependence, unspecified, uncomplicated: Secondary | ICD-10-CM

## 2020-03-03 DIAGNOSIS — Z7901 Long term (current) use of anticoagulants: Secondary | ICD-10-CM

## 2020-03-03 LAB — URINALYSIS, COMPLETE (UACMP) WITH MICROSCOPIC
Bacteria, UA: NONE SEEN
Bilirubin Urine: NEGATIVE
Glucose, UA: >=500 mg/dL — AB
Hgb urine dipstick: NEGATIVE
Ketones, ur: 5 mg/dL — AB
Leukocytes,Ua: NEGATIVE
Nitrite: NEGATIVE
Protein, ur: NEGATIVE mg/dL
Specific Gravity, Urine: 1.009 (ref 1.005–1.030)
pH: 7 (ref 5.0–8.0)

## 2020-03-03 LAB — MAGNESIUM
Magnesium: 2.1 mg/dL (ref 1.7–2.4)
Magnesium: 2 mg/dL (ref 1.7–2.4)

## 2020-03-03 LAB — GLUCOSE, CAPILLARY
Glucose-Capillary: 227 mg/dL — ABNORMAL HIGH (ref 70–99)
Glucose-Capillary: 300 mg/dL — ABNORMAL HIGH (ref 70–99)
Glucose-Capillary: 311 mg/dL — ABNORMAL HIGH (ref 70–99)
Glucose-Capillary: 193 mg/dL — ABNORMAL HIGH (ref 70–99)
Glucose-Capillary: 223 mg/dL — ABNORMAL HIGH (ref 70–99)
Glucose-Capillary: 193 mg/dL — ABNORMAL HIGH (ref 70–99)

## 2020-03-03 LAB — SODIUM
Sodium: 150 mmol/L — ABNORMAL HIGH (ref 135–145)
Sodium: 153 mmol/L — ABNORMAL HIGH (ref 135–145)
Sodium: 155 mmol/L — ABNORMAL HIGH (ref 135–145)

## 2020-03-03 LAB — CBC
HCT: 41.9 % (ref 39.0–52.0)
Hemoglobin: 13.7 g/dL (ref 13.0–17.0)
MCH: 32.2 pg (ref 26.0–34.0)
MCHC: 32.7 g/dL (ref 30.0–36.0)
MCV: 98.4 fL (ref 80.0–100.0)
Platelets: 127 10*3/uL — ABNORMAL LOW (ref 150–400)
RBC: 4.26 MIL/uL (ref 4.22–5.81)
RDW: 14.6 % (ref 11.5–15.5)
WBC: 11 10*3/uL — ABNORMAL HIGH (ref 4.0–10.5)
nRBC: 0 % (ref 0.0–0.2)

## 2020-03-03 LAB — BASIC METABOLIC PANEL
Anion gap: 12 (ref 5–15)
BUN: 18 mg/dL (ref 8–23)
CO2: 22 mmol/L (ref 22–32)
Calcium: 9.1 mg/dL (ref 8.9–10.3)
Chloride: 114 mmol/L — ABNORMAL HIGH (ref 98–111)
Creatinine, Ser: 0.93 mg/dL (ref 0.61–1.24)
GFR, Estimated: >60 mL/min (ref >60)
Glucose, Bld: 293 mg/dL — ABNORMAL HIGH (ref 70–99)
Potassium: 3 mmol/L — ABNORMAL LOW (ref 3.5–5.1)
Sodium: 148 mmol/L — ABNORMAL HIGH (ref 135–145)

## 2020-03-03 LAB — PHOSPHORUS
Phosphorus: 1.8 mg/dL — ABNORMAL LOW (ref 2.5–4.6)
Phosphorus: 1.8 mg/dL — ABNORMAL LOW (ref 2.5–4.6)

## 2020-03-03 LAB — TRIGLYCERIDES: Triglycerides: 101 mg/dL (ref <150)

## 2020-03-03 MED ORDER — OSMOLITE 1.2 CAL PO LIQD
1000.0000 mL | ORAL | Status: DC
  Administered 2020-03-04 – 2020-03-07 (×3): 1000 mL
  Filled 2020-03-03 (×5): qty 1000

## 2020-03-03 MED ORDER — SODIUM CHLORIDE 0.9 % IV SOLN
3.0000 g | Freq: Four times a day (QID) | INTRAVENOUS | Status: AC
  Administered 2020-03-03 – 2020-03-08 (×20): 3 g via INTRAVENOUS
  Filled 2020-03-03: qty 8
  Filled 2020-03-03 (×4): qty 3
  Filled 2020-03-03: qty 8
  Filled 2020-03-03 (×6): qty 3
  Filled 2020-03-03: qty 8
  Filled 2020-03-03 (×3): qty 3
  Filled 2020-03-03: qty 8
  Filled 2020-03-03 (×3): qty 3

## 2020-03-03 MED ORDER — K PHOS MONO-SOD PHOS DI & MONO 155-852-130 MG PO TABS
500.0000 mg | ORAL_TABLET | Freq: Once | ORAL | Status: AC
  Administered 2020-03-03: 500 mg
  Filled 2020-03-03: qty 2

## 2020-03-03 MED ORDER — LOSARTAN POTASSIUM 50 MG PO TABS
100.0000 mg | ORAL_TABLET | Freq: Every day | ORAL | Status: DC
  Administered 2020-03-03 – 2020-03-07 (×5): 100 mg
  Filled 2020-03-03 (×5): qty 2

## 2020-03-03 MED ORDER — POTASSIUM CHLORIDE 20 MEQ/15ML (10%) PO SOLN
40.0000 meq | Freq: Three times a day (TID) | ORAL | Status: AC
  Administered 2020-03-03 – 2020-03-04 (×4): 40 meq
  Filled 2020-03-03 (×4): qty 30

## 2020-03-03 MED ORDER — INSULIN GLARGINE 100 UNIT/ML ~~LOC~~ SOLN
10.0000 [IU] | Freq: Every day | SUBCUTANEOUS | Status: DC
  Administered 2020-03-03 – 2020-03-04 (×2): 10 [IU] via SUBCUTANEOUS
  Filled 2020-03-03 (×3): qty 0.1

## 2020-03-03 MED ORDER — LOSARTAN POTASSIUM 50 MG PO TABS: 100.0000 mg | ORAL_TABLET | Freq: Every day | ORAL | Status: DC

## 2020-03-03 MED ORDER — CARVEDILOL 12.5 MG PO TABS
12.5000 mg | ORAL_TABLET | Freq: Two times a day (BID) | ORAL | Status: DC
  Administered 2020-03-03: 12.5 mg
  Filled 2020-03-03: qty 1

## 2020-03-03 MED ORDER — CARVEDILOL 12.5 MG PO TABS: 12.5000 mg | ORAL_TABLET | Freq: Two times a day (BID) | ORAL | Status: DC

## 2020-03-03 MED ORDER — INSULIN ASPART 100 UNIT/ML ~~LOC~~ SOLN
4.0000 [IU] | SUBCUTANEOUS | Status: DC
  Administered 2020-03-03 – 2020-03-08 (×33): 4 [IU] via SUBCUTANEOUS

## 2020-03-03 NOTE — Progress Notes (Signed)
STROKE TEAM PROGRESS NOTE   INTERVAL HISTORY Wife and RN are at the bedside. Pt lethargic, lying in bed, extubated yesterday, however, still has copious oral and pharyngeal secretions, hard to cough up, needing frequent NT suctioning. No fever today but more lethargic than yesterday per wife. Still has left hemiplegia.   Vitals:   03/03/20 0600 03/03/20 0630 03/03/20 0700 03/03/20 0800  BP: (!) 192/156 (!) 199/117 (!) 192/129 (!) 205/111  Pulse: 97 (!) 52  (!) 45  Resp: (!) 31 (!) 32 (!) 39 (!) 27  Temp:    98.5 F (36.9 C)  TempSrc:    Axillary  SpO2: 99% 100%  100%  Weight:      Height:       CBC:  Recent Labs  Lab 02/14/2020 1317 02/09/2020 1321 03/01/20 0500 03/02/20 0432  WBC 6.3   < > 7.9 9.6  NEUTROABS 4.2  --   --   --   HGB 13.6   < > 12.6* 12.8*  HCT 42.1   < > 38.3* 38.4*  MCV 100.5*   < > 96.2 98.0  PLT 161   < > 128* 126*   < > = values in this interval not displayed.   Basic Metabolic Panel:  Recent Labs  Lab 03/01/20 0500 03/02/20 0432 03/02/20 1037 03/02/20 1037 03/02/20 1642 03/02/20 1642 03/02/20 2303 03/03/20 0613  NA   < > 145 146*   < > 149*   < > 150* 148*  K  --  3.3*  --   --   --   --   --  3.0*  CL  --  112*  --   --   --   --   --  114*  CO2  --  20*  --   --   --   --   --  22  GLUCOSE  --  132*  --   --   --   --   --  293*  BUN  --  21  --   --   --   --   --  18  CREATININE  --  0.90  --   --   --   --   --  0.93  CALCIUM  --  8.7*  --   --   --   --   --  9.1  MG   < >  --  2.1  --   --   --   --  2.1  PHOS  --   --   --   --  1.8*  --   --  1.8*   < > = values in this interval not displayed.   Lipid Panel:  Recent Labs  Lab 03/02/20 0432 03/02/20 0432 03/03/20 0613  CHOL 96  --   --   TRIG 132   < > 101  HDL 28*  --   --   CHOLHDL 3.4  --   --   VLDL 26  --   --   LDLCALC 42  --   --    < > = values in this interval not displayed.   HgbA1c:  Recent Labs  Lab 03/01/20 0500  HGBA1C 6.3*   Urine Drug Screen: No  results for input(s): LABOPIA, COCAINSCRNUR, LABBENZ, AMPHETMU, THCU, LABBARB in the last 168 hours.  Alcohol Level No results for input(s): ETH in the last 168 hours.  IMAGING past 24 hours  DG CHEST PORT  1 VIEW  Result Date: 03/03/2020 CLINICAL DATA:  Fever EXAM: PORTABLE CHEST 1 VIEW COMPARISON:  2020/03/15 FINDINGS: Endotracheal tube is been removed. Enteric tube is in place with tip below the left hemidiaphragm but off the field of view. Cardiac enlargement. Probable small left pleural effusion. Infiltration or atelectasis in the left base. Hazy interstitial changes in the lungs may represent edema. Calcification of the aorta. IMPRESSION: Cardiac enlargement with probable small left pleural effusion and infiltration or atelectasis in the left base. Hazy interstitial changes in the lungs may represent edema. Electronically Signed   By: Burman Nieves M.D.   On: 03/03/2020 07:10   CT HEAD WO CONTRAST 03/02/2020 IMPRESSION: 1. 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 No hemorrhagic transformation or other complication. Localized edemawithout midline shift. 2. No other new acute intracranial abnormality.   PHYSICAL EXAM   Temp:  [98.5 F (36.9 C)-100.1 F (37.8 C)] 98.5 F (36.9 C) (11/27 0800) Pulse Rate:  [43-105] 88 (11/27 1000) Resp:  [17-39] 27 (11/27 1000) BP: (112-213)/(66-156) 174/102 (11/27 1000) SpO2:  [97 %-100 %] 100 % (11/27 1000) Arterial Line BP: (130-232)/(58-116) 198/93 (11/27 1000) FiO2 (%):  [30 %] 30 % (11/26 1119) Weight:  [87 kg] 87 kg (11/27 0500)  General - Well nourished, well developed, lethargic, eyes closed, frequent cough with copious oral secreation.  Ophthalmologic - fundi not visualized due to noncooperation.  Cardiovascular - irregularly irregular heart rate and rhythm.  Neuro - Eyes are closed, very lethargic, not open on voice or commands, however, he was able to  follow other simple commands on the right and midline. Nonverbal though. With forced eye opening, pt not blinking to visual threat bilaterally, right gaze preference, not able to cross midline. Able to track on the right with eye movement to the right. PERRL. Mild left facial weakness.  Tongue protrusion not cooperative.  Motor system exam shows spontaneous purposeful right upper extremity movements barely against gravity, RLE withdraw 3-/5 to pain and moving toes PF/DF on request.  Left upper and lower extremity hypotonic and slight withdraw respond to painful stimulus. No babinski bilaterally.  Gait not tested   ASSESSMENT/PLAN [name] is a 79 y.o. male with history of AF on warfarin, know ICA stenosis presenting with L sided weakness and hemianopsia. He did not receive tPA.  Taken to IR for emergent stenting of R ICA occlusion.  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 Large vessel disease  CT head no acute abnormality. Possible R forehead scalp hematoma/contusion  CTA head & neck R cervical ICA origin occlusion with reconstitution at terminus. Critical stenosis brachiocephalic artery. Severe stenosis R P2. Multifocal moderate R A2, proximal R P2, severe distal R P2, B mid VA, L VA origin stenoses. B ICA atherosclerosis w/ L ICA web. Severe degenerative C6-7 disc dz.  CT perfusion large penumbra  Cerebral angio - R ICA occlusion from bulb to proximal ICA terminus. Stent R ICA. ICA remains occluded after ICA stent, secondary to occlusion at the supraclinoid segment. Right M1 remains occluded, embolization to new territory.  CT head post IR R MCA infarct. Atrophy. Sinus dz.  MRI  R MCA infarct w/ sparing of putamen, globus pallidus and caudate head. Petechial hemorrhage. No shift. Abnormal flor R ICA w/ partial dissection.  2D  Echo EF  60-65%. No source of embolus. LA moderately dilated  LDL 42     HgbA1c 6.3  VTE prophylaxis - Lovenox 40 mg sq daily   aspirin 81 mg daily and warfarin daily prior to admission w/ INR 1.4 on arrival, now on aspirin 81 mg daily and clopidogrel 75 mg daily.   Therapy recommendations:  CIR vs SNF  Disposition:  pending   Acute Respiratory Failure  Secondary to stroke  Intubated for IR, extubated 03/02/2020  Continue to have copious secretions  Close monitoring respiratory status  CCM on board   Cytotoxic cerebral edema  MRI large right MCA infarct with cytotoxic edema and effacement of right lateral ventricle  On 3% at 75cc -> 50cc / hr  Goal Na 145-155  Na 145->146->149->150->148  CT repeat in a.m.  Chronic Atrial Fibrillation on Coumadin with subtherapeutic INR  Home anticoagulation:  warfarin daily + asa 81  Admission INR 1.4 -> 1.5  On DAPT now  Not an AC candidate at this time d/t large stroke sites and risk of hemorrhagic transformation    Consider resume anticoagulation in 10 to 14 days  Aspiration pneumonia  Fever T-max 100.6->100.1->afebrile  Extubated 11/26 but still have copious secretions  CXR small left pleural effusion and infiltration or atelectasis in the left base  On Unasyn  CCM on board  NT suctioning as needed  Hypertension  Home meds:  Coreg 12.5 bid, losartan 50, Maxzide . BP on the high end . Resume Coreg 12.5 and losartan 100 . Long-term BP goal normotensive  Hyperlipidemia  Home meds:  lipitor 80 + omega 3  LDL 42, goal < 70  On Lipitor 40  Continue statin on discharge  Diabetes type II Controlled  Home meds - amaryl  HgbA1c 6.3, goal < 7.0  CBGs  SSI  On Lantus  PT follow-up  Dysphagia . Secondary to stroke . NPO . Speech on board . Cortrak placed 11/26 - on TF   Tobacco abuse  Current smoker  Smoking cessation counseling will be provided  Pt is willing to quit  Other Stroke Risk  Factors  Advanced Age >/= 65   ETOH use 4x wk  Obesity, Body mass index is 30.96 kg/m., BMI >/= 30 associated with increased stroke risk, recommend weight loss, diet and exercise as appropriate   Family hx stroke (father)  Coronary artery disease s/p stent  Other Active Problems  Gout on allopurinal 300 daily  GERD on prilosec  BPH on flomax  Hypokalemia K 2.4->4.4->3.7->3.3->3.0 supplemented  Mild thrombocytopenia - platelets - 161->128->126   Hospital day # 3  This patient is critically ill due to large right MCA stroke, cerebral edema, respiratory failure, aspiration pneumonia, dysphagia, A. fib and at significant risk of neurological worsening, death form recurrent stroke, brain herniation, heart failure, sepsis, seizure. This patient's care requires constant monitoring of vital signs, hemodynamics, respiratory and cardiac monitoring, review of multiple databases, neurological assessment, discussion with family, other specialists and medical decision making of high complexity. I spent 45 minutes of neurocritical care time in the care of this patient. I had long discussion with wife at bedside, updated pt current condition, treatment plan and potential prognosis, and answered all the questions.  She expressed understanding and appreciation.   Marvel Plan, MD PhD Stroke Neurology 03/03/2020 5:45 PM      To contact Stroke Continuity provider, please refer to [email or web address]. After hours, contact General Neurology

## 2020-03-03 NOTE — Evaluation (Signed)
Speech Language Pathology Evaluation Patient Details Name: Richard Farmer MRN: [id number] DOB: 25-Oct-1940 Today's Date: 03/03/2020 Time:  -     Problem List:  Patient Active Problem List   Diagnosis Date Noted  . Acute right MCA stroke (HCC) 03/06/2020  . Encounter for orogastric (OG) tube placement   . Ventilator dependence [facility])    Past Medical History:  Past Medical History:  Diagnosis Date  . Atrial fibrillation (HCC)   . CAD (coronary artery disease)   . Carotid artery disease (HCC)   . Diabetes mellitus (HCC)   . Gout   . Hyperlipidemia   . Hypertension    Past Surgical History:  Past Surgical History:  Procedure Laterality Date  . CORONARY STENT PLACEMENT    . IR ANGIO INTRA EXTRACRAN SEL INTERNAL CAROTID UNI L MOD SED  02/19/2020  . IR CT HEAD LTD  03/05/2020  . IR PERCUTANEOUS ART THROMBECTOMY/INFUSION INTRACRANIAL INC DIAG ANGIO  02/19/2020  . IR US GUIDE VASC ACCESS RIGHT  02/23/2020  . PROSTATE SURGERY    . RADIOLOGY WITH ANESTHESIA N/A 02/17/2020   Procedure: IR WITH ANESTHESIA - CODE STROKE;  Surgeon: Radiologist, Medication, MD;  Location: [facility] OR;  Service: Radiology;  Laterality: N/A;  . REPLACEMENT TOTAL KNEE Right   . SPINE SURGERY     HPI:  79 y.o. M of atrial fibrillation on coumadin, CAD, carotid stenosis, DM, HL and HTN who presented with L-sided weakness and found to have right ICA cervical occlusion s/p stenting right ICA and unsuccessful thrombectomy of right MCA/M1 and intracranial ICA; intubated from 11/24-11/26/21; wife at bedside, partial SLE completed.   Assessment / Plan / Recommendation Clinical Impression  Limited assessment for speech/language due to pt's level of alertness/interaction as he kept his eyes closed during SLE, but was participatory with various tasks throughout evaluation.  Pt aphonic during assessment, so verbal expression unable to be fully assessed.  Pt mouthing or gesturing to communicate with SLP during assessment. He was  able to indicate name and location via mouthing words and answered questions with yes/no head shake or nod.  Simple Y/N responses were accurate and he was able to follow simple 1-2 step directives with mod verbal cues, but multi-step directives decreased overall.  Reading/writing tasks were not completed d/t pt lethargy.  Purposeful communication noted with simple tasks such as waving bye when SLP left room.  [facility] swallow screen not completed and pt currently has Cortrak for nutritive/hydration purposes.  Please consult ST when pt is able to participate/appropriate for BSE.  ST will f/u for on-going assessment of speech/language/cognition while in acute setting.  CIR candidacy difficult to determine at this juncture.  Thank you for this consult.    SLP Assessment  SLP Recommendation/Assessment: Patient needs continued Speech Language Pathology Services SLP Visit Diagnosis: Aphonia (R49.1);Cognitive communication deficit (R41.841)    Follow Up Recommendations  Other (comment) (TBD)    Frequency and Duration min 2x/week  1 week      SLP Evaluation Cognition  Overall Cognitive Status: Difficult to assess Arousal/Alertness: Lethargic Orientation Level: Oriented X4 Attention:  (DTA) Memory:  (DTA) Awareness:  (DTA) Behaviors: Restless Safety/Judgment: Other (comment) (DTA)       Comprehension  Auditory Comprehension Overall Auditory Comprehension: Impaired Yes/No Questions: Within Functional Limits Commands: Impaired Conversation: Simple Interfering Components: Other (comment) (level of alertness) EffectiveTechniques:  (repetition, multimodal cues, increased volume) Visual Recognition/Discrimination Discrimination: Not tested Reading Comprehension Reading Status: Not tested    Expression Expression Primary Mode of Expression: Nonverbal -  gestures Verbal Expression Overall Verbal Expression:  (mouthing of words) Level of Generative/Spontaneous Verbalization: Word Interfering  Components: Other (comment) (Level of alertness; aphonia) Non-Verbal Means of Communication: Gestures Written Expression Dominant Hand: Right Written Expression: Not tested   Oral / Motor  Oral Motor/Sensory Function Overall Oral Motor/Sensory Function: Mild impairment (left hemiparesis; generalized oral weakness) Motor Speech Overall Motor Speech: Other (comment) (DTA) Respiration: Within functional limits Phonation: Aphonic Intelligibility: Unable to assess (comment) Motor Planning: Not tested Motor Speech Errors: Not applicable                       Tressie Stalker, M.S., CCC-SLP 03/03/2020, 4:15 PM

## 2020-03-03 NOTE — Progress Notes (Signed)
NAME:  Richard Farmer, MRN:  [id number], DOB:  [birth year], LOS: 3 ADMISSION DATE:  Mar 24, 2020, CONSULTATION DATE:  03/03/20 REFERRING MD:  Loreta Ave, CHIEF COMPLAINT:   L-sided weakness, CVA  Brief History   79 y.o. M of atrial fibrillation on coumadin, CAD, carotid stenosis, DM, HL and HTN who presented with L-sided weakness and found to have right ICA cervical occlusion s/p stenting right ICA and unsuccessful thrombectomy of right MCA/M1 and intracranial ICA.  Left intubated post procedure   Past Medical History   has a past medical history of Atrial fibrillation (HCC), CAD (coronary artery disease), Carotid artery disease (HCC), Diabetes mellitus (HCC), Gout, Hyperlipidemia, and Hypertension.   Significant Hospital Events   11/24 Admit to Neurology  Consults:  PCCM  Procedures:  11/24 ETT >> 11/24 left radial aline >>  Significant Diagnostic Tests:  11/24 CTA head>> Age indeterminate occlusion of the right cervical ICA at its origin with reconstitution at the carotid terminus. The right MCA and ACA branches are opacified. 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  Multifocal moderate atherosclerotic stenoses, including the right A2 ACA, proximal right P2 PCA (in addition to severe distal stenosis), bilateral intradural vertebral arteries, and left vertebral artery origin.  11/24 CXR>> no acute infiltrate, ETT in good position  11/25 CT head : Acute right MCA territory infarct. Mild cerebral atrophy. Mild pansinus disease. Trace layering secretions may reflect. active inflammation.  11/25 MRI brain >> 1. Acute infarction affecting the right middle cerebral artery territory with sparing of the putamen, globus pallidus and caudate head. Region of cortical and subcortical infarction measures up to 11.5 cm. Minimal petechial blood products in the region of the infarction. No measurable hematoma. No midline shift. 2. Abnormal flow pattern in the  right ICA at the base of the brain. There appears to be partial thrombosis or dissection.  11/25 TTE >> 1. Left ventricular ejection fraction, by estimation, is 60 to 65%. The left ventricle has normal function. The left ventricle has no regional wall motion abnormalities. There is mild left ventricular hypertrophy. Left ventricular diastolic parameters  are indeterminate.  2. Right ventricular systolic function is normal. The right ventricular size is mildly enlarged. There is mildly elevated pulmonary artery systolic pressure. The estimated right ventricular systolic pressure is 37.4 mmHg.  3. Left atrial size was moderately dilated.  4. Right atrial size was moderately dilated.  5. The mitral valve is normal in structure. Trivial mitral valve regurgitation. No evidence of mitral stenosis.  6. Tricuspid valve regurgitation is moderate.  7. The aortic valve is calcified. There is moderate calcification of the aortic valve. There is moderate thickening of the aortic valve. Aortic valve regurgitation is not visualized. Mild aortic valve stenosis. Aortic valve mean gradient measures 15.6  mmHg. Aortic valve Vmax measures 2.56 m/s.  8. The inferior vena cava is dilated in size with >50% respiratory variability, suggesting right atrial pressure of 8 mmHg.   11/26 CTH >> 1. 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 No hemorrhagic transformation or other complication. Localized edema without midline shift. 2. No other new acute intracranial abnormality.   Micro Data:  11/24 Covid-19 and Influenza>>negative 11/24 MRSA  >> neg  Antimicrobials:  n/a  Interim history/subjective:  Patient was extubated yesterday after he tolerated pressure support trial, still he is lethargic  Objective   Blood pressure (!) 192/130, pulse 85, temperature 98.5 F (36.9 C),  temperature source Axillary, resp. rate (!) 31, height 5\' 6"   (1.676 m), weight 87 kg, SpO2 100 %.        Intake/Output Summary (Last 24 hours) at 03/03/2020 1132 Last data filed at 03/03/2020 1100 Gross per 24 hour  Intake 2339.72 ml  Output 3875 ml  Net -1535.28 ml   Filed Weights   05-Mar-2020 1300 03/05/2020 1403 03/03/20 0500  Weight: 87 kg 87 kg 87 kg   General:  Elderly Caucasian male, lying on the bed HEENT: Atraumatic, normocephalic MM pink/moist, Neuro: Eyes closed, raises eyebrows but unable to open eyes- will look to the right, f/c on RUE 4/5, RLE 3/5, flicker of movement in LLE, flaccid in LUE/ at times extensor posturing CV: Irregularly irregular, no murmur PULM: Clear to auscultation bilaterally GI: soft, NT/ND, bs+ Extremities: warm/dry, no LE edema  Skin: no rashes  Resolved Hospital Problem list     Assessment & Plan:  Acute right MCA territory stroke, due to right ICA/right M1 occlusion status post attempted thrombectomy and stent placement in the right ICA Cerebral edema  Dysphagia due to acute stroke MRI brain as above with f/u CTH this am showing large right MCA territory infarct with stable/ localized edema, no hemorrhagic transformation Continue hypertonic saline at 75 ml/hr- via PIV.  Serum sodium is at 150, trending Na q 6hr, goal Na 155-160 Continue neuro watch every hour TTE LVEF 60-65%, no regional wall abnormalities Continue aspirin and Plavix Continue secondary stroke prophylaxis Cortrak was placed yesterday, continue tube feeds   Acute respiratory failure due to stroke, resolved Patient was successfully extubated yesterday He is at high risk of aspiration due to stroke and weak cough Continue chest physical therapy  Possible aspiration pneumonia Patient's x-ray chest suggest left lower lobe infiltrates, bedside POCUS confirmed presence of infiltrate in left lower lobe Started on Unasyn Send respiratory culture  Chronic atrial fibrillation Rate controlled Continue to hold home coumadin for  now  Hypokalemia/hypomagnesemia/hypophosphatemia Aggressively supplement electrolytes, and monitor  Diabetes type 2 Monitor fingerstick with goal 140-180 Continue sliding scale insulin  Best practice (evaluated daily)   Diet: NPO, continue tube feeds pain/Anxiety/Delirium protocol (if indicated): Propofol, RASS goal 0 VAP protocol (if indicated): HOB 30 degrees, suction as needed DVT prophylaxis: Subcu Lovenox GI prophylaxis: Protonix Glucose control: SSI Mobility: Bedrest Code Status: Full code Disposition: ICU  Labs   CBC: Recent Labs  Lab 03/05/2020 1317 03-05-2020 1321 Mar 05, 2020 2007 03/05/2020 2118 03/01/20 0500 03/02/20 0432 03/03/20 0613  WBC 6.3  --  6.1  --  7.9 9.6 11.0*  NEUTROABS 4.2  --   --   --   --   --   --   HGB 13.6   < > 13.1 12.9* 12.6* 12.8* 13.7  HCT 42.1   < > 39.3 38.0* 38.3* 38.4* 41.9  MCV 100.5*  --  95.2  --  96.2 98.0 98.4  PLT 161  --  125*  --  128* 126* 127*   < > = values in this interval not displayed.    Basic Metabolic Panel: Recent Labs  Lab 03/05/2020 1317 03-05-20 1317 03-05-20 1321 05-Mar-2020 1321 Mar 05, 2020 1832 03-05-20 1832 03-05-2020 2118 03-05-2020 2118 03/01/20 0500 03/01/20 1632 03/02/20 0432 03/02/20 0432 03/02/20 1037 03/02/20 1642 03/02/20 2303 03/03/20 0613 03/03/20 1037  NA 140   < > 140   < > 147*   < > 139   < > 139   < > 145   < > 146* 149* 150* 148*  150*  K 3.7   < > 3.6   < > 2.4*  --  4.4  --  3.7  --  3.3*  --   --   --   --  3.0*  --   CL 103   < > 102  --  114*  --   --   --  109  --  112*  --   --   --   --  114*  --   CO2 29  --   --   --   --   --   --   --  20*  --  20*  --   --   --   --  22  --   GLUCOSE 176*   < > 163*  --  90  --   --   --  166*  --  132*  --   --   --   --  293*  --   BUN 26*   < > 29*  --  15  --   --   --  26*  --  21  --   --   --   --  18  --   CREATININE 1.06   < > 1.00  --  0.30*  --   --   --  1.03  --  0.90  --   --   --   --  0.93  --   CALCIUM 9.3  --   --   --   --    --   --   --  8.5*  --  8.7*  --   --   --   --  9.1  --   MG  --   --   --   --   --   --   --   --  1.6*  --   --   --  2.1  --   --  2.1  --   PHOS  --   --   --   --   --   --   --   --   --   --   --   --   --  1.8*  --  1.8*  --    < > = values in this interval not displayed.   GFR: Estimated Creatinine Clearance: 65.5 mL/min (by C-G formula based on SCr of 0.93 mg/dL). Recent Labs  Lab 02/28/2020 2007 03/01/20 0500 03/02/20 0432 03/03/20 0613  WBC 6.1 7.9 9.6 11.0*    Liver Function Tests: Recent Labs  Lab 02/13/2020 1317 03/01/20 0500  AST 21 17  ALT 23 19  ALKPHOS 82 67  BILITOT 1.3* 1.4*  PROT 6.3* 5.1*  ALBUMIN 3.4* 3.1*   No results for input(s): LIPASE, AMYLASE in the last 168 hours. No results for input(s): AMMONIA in the last 168 hours.  ABG    Component Value Date/Time   PHART 7.412 02/22/2020 2118   PCO2ART 37.5 02/11/2020 2118   PO2ART 195 (H) 02/10/2020 2118   HCO3 23.8 02/20/2020 2118   TCO2 25 02/24/2020 2118   ACIDBASEDEF 1.0 02/20/2020 2118   O2SAT 100.0 03/01/2020 2118     Coagulation Profile: Recent Labs  Lab 02/13/2020 1317 03/01/20 0500  INR 1.4* 1.5*    Cardiac Enzymes: No results for input(s): CKTOTAL, CKMB, CKMBINDEX, TROPONINI in the last 168 hours.  HbA1C: Hgb A1c MFr Bld  Date/Time Value  Ref Range Status  03/01/2020 05:00 AM 6.3 (H) 4.8 - 5.6 % Final    Comment:    (NOTE) Pre diabetes:          5.7%-6.4%  Diabetes:              >6.4%  Glycemic control for   <7.0% adults with diabetes     CBG: Recent Labs  Lab 03/02/20 1556 03/02/20 2025 03/02/20 2305 03/03/20 0345 03/03/20 0801  GLUCAP 121* 126* 131* 227* 300*    Review of Systems:   Unable to obtain as patient is intubated  Past Medical History  He,  has a past medical history of Atrial fibrillation (HCC), CAD (coronary artery disease), Carotid artery disease (HCC), Diabetes mellitus (HCC), Gout, Hyperlipidemia, and Hypertension.   Surgical History     Past Surgical History:  Procedure Laterality Date  . CORONARY STENT PLACEMENT    . IR ANGIO INTRA EXTRACRAN SEL INTERNAL CAROTID UNI L MOD SED  02/23/2020  . IR CT HEAD LTD  02/25/2020  . IR PERCUTANEOUS ART THROMBECTOMY/INFUSION INTRACRANIAL INC DIAG ANGIO  02/17/2020  . IR US GUIDE VASC ACCESS RIGHT  03/05/2020  . PROSTATE SURGERY    . RADIOLOGY WITH ANESTHESIA N/A 02/14/2020   Procedure: IR WITH ANESTHESIA - CODE STROKE;  Surgeon: Radiologist, Medication, MD;  Location: [facility] OR;  Service: Radiology;  Laterality: N/A;  . REPLACEMENT TOTAL KNEE Right   . SPINE SURGERY       Social History   reports that he has been smoking cigars. He does not have any smokeless tobacco history on file.   Family History   His family history includes CAD in his father and mother; CVA in his father.   Allergies Allergies  Allergen Reactions  . Ibuprofen Other (See Comments)    Stomach irritation      Home Medications  Prior to Admission medications   Not on File      Total critical care time: 39 minutes  Performed by: Cheri FowlerSudham Tevin Shillingford   Critical care time was exclusive of separately billable procedures and treating other patients.   Critical care was necessary to treat or prevent imminent or life-threatening deterioration.   Critical care was time spent personally by me on the following activities: development of treatment plan with patient and/or surrogate as well as nursing, discussions with consultants, evaluation of patient's response to treatment, examination of patient, obtaining history from patient or surrogate, ordering and performing treatments and interventions, ordering and review of laboratory studies, ordering and review of radiographic studies, pulse oximetry and re-evaluation of patient's condition.   Cheri FowlerSudham Rusty Glodowski MD Critical care physician [facility]  Pager: [phone] Mobile: [phone]

## 2020-03-03 NOTE — Progress Notes (Signed)
Inpatient Rehab Admissions Coordinator Note:   Per therapy recommendations, pt was screened for CIR candidacy by Megan Salon, MS CCC-SLP. At this time, pt.is total assist for bed-level activities and has not attempted transfers. He does not appear to be a candidate for CIR currently; however, [facility] team will follow and monitor for progress with therapies and place consult order if Pt. Appears to be an appropriate candidate.  Please contact me with questions.   Megan Salon, MS, CCC-SLP Rehab Admissions Coordinator  [phone] (celll) [phone] (office)

## 2020-03-03 NOTE — Progress Notes (Signed)
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 Following on the right. No movement with left side today.  Tachypneic   Allergies: Ibuprofen  Medications: Prior to Admission medications   Not on File     Vital Signs: BP (!) 182/105   Pulse 96   Temp 98.6 F (37 C) (Axillary)   Resp (!) 21   Ht 5\' 6"  (1.676 m)   Wt 191 lb 12.8 oz (87 kg)   SpO2 100%   BMI 30.96 kg/m   Physical Exam  Extubated, aware but will not open his eyes on command today Neuro: moving right side purposefully. No movement noted on left today. Does not open eyes but respond to commands. Groin: intact, soft.  No evidence of hematoma or pseudoaneurysm. Pulses: distal pulses intact, palpable today.   Imaging: CT Head Wo Contrast  Result Date: 03/02/2020 CLINICAL DATA:  Follow-up examination for acute stroke. EXAM: CT HEAD WITHOUT CONTRAST TECHNIQUE: Contiguous axial images were obtained from the base of the skull through the vertex without intravenous contrast. COMPARISON:  Prior CT and MRI from 03/01/2020 FINDINGS: Brain: Continued interval evolution of cytotoxic edema involving a large portion of the right frontal and temporal regions, consistent with evolving right MCA territory infarct. Overall, size and distribution is stable from prior MRI. Involvement of the right basal ganglia again noted. Localized edema with partial effacement of the right lateral ventricle without significant midline shift. No hemorrhagic transformation or other complication. No other new acute large vessel territory infarct or hemorrhage. Underlying atrophy with chronic microvascular ischemic disease again noted. No extra-axial fluid collection. Vascular: No hyperdense vessel.  Calcified atherosclerosis present at skull base. Skull: Scalp soft tissues and calvarium within normal limits. Sinuses/Orbits: Globes orbital soft tissues demonstrate no acute finding. Scattered mucosal thickening noted throughout the paranasal sinuses. Endotracheal and enteric tubes partially visualized. No significant mastoid effusion. Other: None. IMPRESSION: 1. 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 No hemorrhagic transformation or other complication. Localized edema without midline shift. 2. No other new acute intracranial abnormality. Electronically Signed   By: Rise Mu M.D.   On: 03/02/2020 04:52   CT HEAD WO CONTRAST  Result Date: 03/01/2020 CLINICAL DATA:  Stroke, follow-up. EXAM: CT HEAD WITHOUT CONTRAST TECHNIQUE: Contiguous axial images were obtained from the base of the skull through the vertex without intravenous contrast. COMPARISON:  02/08/2020 head CT. FINDINGS: Brain: Ill-defined hypodense region and blurring of the gray-white junctions involving the right MCA territory. No intracranial hemorrhage. No mass lesion. No midline shift, ventriculomegaly or extra-axial fluid collection. Mild cerebral atrophy with ex vacuo dilatation. Vascular: No hyperdense vessel or unexpected calcification. Bilateral skull base atherosclerotic calcifications. Skull: Negative for fracture or focal lesion. Sinuses/Orbits: Normal orbits. Minimal pansinus mucosal thickening with trace layering maxillary sinus secretions. Pneumatized mastoid air cells. Other: None. IMPRESSION: 1. Acute right MCA territory infarct. 2. Mild cerebral atrophy. 3. Mild pansinus disease. Trace layering secretions may reflect active inflammation. These results will be called to the ordering clinician or representative by the Radiologist Assistant, and communication documented in the PACS or [employer]. Electronically Signed   By: Stana Bunting  M.D.   On: 03/01/2020 06:05   MR BRAIN WO CONTRAST  Result Date: 03/01/2020 CLINICAL DATA:  Acute right MCA infarction by CT. EXAM: MRI HEAD WITHOUT CONTRAST TECHNIQUE: Multiplanar, multiecho pulse sequences of the brain and surrounding structures were obtained without intravenous contrast. COMPARISON:  CT same day.  Multiple CT studies done yesterday. FINDINGS: Brain: Diffusion imaging shows acute infarction affecting the right middle cerebral artery territory with sparing of the putamen, globus pallidus and caudate head. Infarction affects the caudate body. Region of cortical and subcortical infarction measures up to 11.5 cm. Mild swelling. Minimal petechial blood products in the region of the infarction. No measurable hematoma. No midline shift. No extra-axial fluid collection. Vascular: Abnormal flow pattern in the right ICA at the base of the brain. There appears to be partial thrombosis or dissection. Skull and upper cervical spine: Negative Sinuses/Orbits: Mucosal inflammatory changes of the paranasal sinuses. Orbits negative. Chronic staphylomas. Other: None IMPRESSION: 1. Acute infarction affecting the right middle cerebral artery territory with sparing of the putamen, globus pallidus and caudate head. Region of cortical and subcortical infarction measures up to 11.5 cm. Minimal petechial blood products in the region of the infarction. No measurable hematoma. No midline shift. 2. Abnormal flow pattern in the right ICA at the base of the brain. There appears to be partial thrombosis or dissection. Electronically Signed   By: Paulina Fusi M.D.   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 Intubation was performed in negative pressure [place] in neuro IR holding. Left radial arterial line was performed by the anesthesia team. Interventional neuro radiology nursing staff was also present. CONTRAST:  240 cc FLUOROSCOPY TIME:  Fluoroscopy Time: 131 minutes 46 seconds (5866 mGy). 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 New right M1 occlusion, Embolization to [facility] (ENT) TECHNIQUE: Informed written consent was obtained from the patient's family after a thorough discussion of the procedural risks, benefits and alternatives. Specific risks discussed include: Bleeding, infection, contrast reaction, kidney injury/failure, need for further procedure/surgery, arterial injury or dissection, embolization to new territory, intracranial hemorrhage (10-15% risk), neurologic deterioration, cardiopulmonary collapse, death. All questions were addressed. Maximal Sterile Barrier Technique was utilized including during the procedure including caps, mask, sterile gowns, sterile gloves, sterile drape, hand hygiene and skin antiseptic. A timeout was performed prior to the initiation of the procedure. The anesthesia team was present to provide general endotracheal tube anesthesia and for patient monitoring during the procedure. Interventional neuro radiology nursing staff was also present. FINDINGS: Initial Findings: Type 2 and patulous carotid arch. Innominate artery contributes to origin of the left common carotid artery, in bovine configuration. Right common carotid artery: Normal course caliber and contour. Minimal plaque with no filling defects or irregularity. Right external carotid artery: Patent with antegrade flow. No collateral flow through the middle  meningeal pathway, ethmoid pathway, or supraorbital pathway to the right MCA. Right internal carotid artery: The right cervical ICA is occluded at the origin with dense calcified plaque with at least 50% circumferential involvement on the angiogram and the prior CT angiogram. There is absence of flow through the entire right cervical ICA to the skull base Right MCA: Initial angiogram was inadequate for evaluating  the right MCA, given the ICA occlusion Right ACA: Initial angiogram inadequate for evaluating right ACA, given the ICA occlusion. Intraprocedural findings: The initial passage of microcatheter through the ICA occlusion was without difficulty, without significant stenosis perceived at the proximal cervical ICA. The initial and subsequent passages of the microwire and microcatheter through the cavernous segment, ophthalmic segment/supra ophthalmic segment and terminus was quite difficult given the character of the occlusion in the carotid siphon. The first microcatheter position within the proximal M1, just distal to the bifurcation, demonstrates slip-streaming artifact from the cross-filling ACA territory with patency of the lenticulostriate arteries demonstrated. The first deployment of EMBO trap in this initial catheter position at the right terminus segment. Initial 4 mm balloon angioplasty of the right carotid bulb demonstrated no perceivable waist on the 4 mm balloon, which was inflated to 14 atmospheres without a physiologic response. After the initial EMBO trap deployment, balloon angioplasty, and then retrieval of the EMBO trap through the intermediate catheter and balloon guide, there was persistent occlusion of the ICA through the petrous segment class oral segment to the siphon. The next placement of the microcatheter into the proximal M1 segment demonstrated embolization new territory to the M1. The second deployment stent retriever, the 6 mm solitaire was across the M1 segment and into the  terminus segment. After withdrawal of the solitary catheter angiogram confirmed no flow through the cervical segment. After placement of 9 mm-7 mm ICA carotid stent, there was persistent occlusion of the cervical ICA. After third deployment of stent retriever, 6 mm solitaire across the M1 segment and terminus segment and withdrawal there is no flow through the cervical ICA segment. Therefore assessment of the MCA cannot be performed from the right. Of note, with each further navigation of the micro wire and the microcatheter through the carotid siphon there was increasing difficulty. This may be interpreted as either persisting dense/fibrous embolism and/or dissection and/or chronic stenosis within the carotid siphon secondary to intracranial atherosclerosis. At this point, left cervical/cerebral angiogram was performed, findings below. Final left-sided angiogram demonstrates patency of the anterior communicating artery, with filling of the right ACA territory and leptomeningeal collateral formation towards the MCA watershed, with a persisting right M1 occlusion. Completion Findings: Right MCA: M1 occlusion TICI: TICI 0 Left common carotid artery: Tortuous course of the proximal left common carotid artery, with bovine configuration origin from a type 2 arch. Left external carotid artery: Patent with antegrade flow. Left internal carotid artery: Irregular changes at the left carotid bulb, with a given history of prior carotid endarterectomy. No flow limiting stenosis is present. 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 There is also a focal outpouching on the posterior proximal cervical ICA, without filling defect. Relatively straight course of the left cervical ICA. Vertical and petrous segment patent with normal course caliber contour. Cavernous segment patent. Clinoid segment patent. Antegrade  flow of the ophthalmic artery. Ophthalmic segment patent. Terminus patent. Left MCA: M1 segment patent. Insular and opercular segments patent. Unremarkable caliber and course of the cortical segments. Typical arterial, capillary/ parenchymal, and venous phase. Left ACA: A1 origin is lateral to the carotid siphon with a long A1 segment. The anterior communicating artery is patent, with cross flow into the right-sided hemisphere. The right A1 is normal caliber, with cross fill into the right ACA territory. There is persistent right M1 occlusion, on the final angiogram from the  left. PROCEDURE: The anesthesia team was present to provide general endotracheal tube anesthesia and for patient monitoring during the procedure. Intubation was performed in negative pressure [place] in neuro IR holding. Interventional neuro radiology nursing staff was also present. Ultrasound survey of the right inguinal region was performed with images stored and sent to PACs. 11 blade scalpel was used to make a small incision. Blunt dissection was performed with US guidance. A micropuncture needle was used access the right common femoral artery under ultrasound. With excellent arterial blood flow returned, an .018 micro wire was passed through the needle, observed to enter the abdominal aorta under fluoroscopy. The needle was removed, and a micropuncture sheath was placed over the wire. The inner dilator and wire were removed, and an 035 wire was advanced under fluoroscopy into the abdominal aorta. The sheath was removed and a 25cm 39F straight vascular sheath was placed. The dilator was removed and the sheath was flushed. Sheath was attached to pressurized and heparinized saline bag for constant forward flow. A coaxial system was then advanced over the 035 wire. This included a 95cm 087 "Walrus" balloon guide with coaxial 125cm Berenstein diagnostic catheter. This was advanced to the proximal descending thoracic aorta. Wire was then removed.  Double flush of the catheter was performed. Catheter was then used to select the innominate artery. Angiogram was performed. Using roadmap technique, the catheter was advanced over a standard glide wire into right common carotid artery, using a wire position in the external carotid artery, achieving position of the balloon guide catheter in the common carotid artery. The diagnostic catheter and the wire were removed. Formal angiogram was performed. Road map function was used once the occluded vessel was identified. Copious back flush was performed and the balloon catheter was attached to heparinized and pressurized saline bag for forward flow. A second coaxial system was then advanced through the balloon catheter, which included the selected intermediate catheter, microcatheter, and microwire. In this scenario, the set up included a 137cm zoom 71 intermediate catheter, a rapid transit microcatheter, and 014 synchro soft wire. This system was advanced through the balloon guide catheter under the road-map function, with adequate back-flush at the rotating hemostatic valve at that back end of the balloon guide. The microwire was advanced through the occlusion in the proximal cervical ICA, with behavior of the microwire and the medical catheter suggesting a patent ICA with a luminal position. The intermediate catheter tract easily over the microcatheter into the distal cervical ICA. The rapid transit and the microwire were advanced to the cavernous segment of the intracranial ICA. Intermediate catheter remained at the skull base. Microcatheter was then gently advanced with a loop configuration through the carotid sinus, observing the wire to enter the proximal MCA under fluoroscopy. The microcatheter encountered resistance in its course through the carotid siphon and into the proximal right MCA. The wire was removed with saline drip at the hub. Blood was then aspirated through the hub of the microcatheter, and a gentle  contrast injection was performed confirming intraluminal position in the proximal M1 segment, just beyond the terminus. Slip streaming artifact confirmed inflow of the contralateral unopacified blood through the patent anterior communicating artery. A rotating hemostatic valve was then attached to the back end of the microcatheter, and a pressurized and heparinized saline bag was attached to the catheter. 6.74mm x 45mm Embotrap stent retreiver device was then selected. Back flush was achieved at the rotating hemostatic valve, and then the device was gently advanced through the microcatheter  to the distal end. The retriever was then unsheathed by withdrawing the microcatheter under fluoroscopy, targeting the most distal aspect terminating within the ICA terminus segment proximal to the bifurcation. Once the retriever was completely unsheathed, the microcatheter was carefully stripped from the delivery device. At this time, with the knowledge of the inflow from the patent anterior communicating artery and the A1 segment, the EMBO trap device was left in place while we addressed the occluded proximal ICA and the presumable permissive lesion at the ICA origin. A second and parallel synchro soft microwire was advanced through the balloon guide catheter using the rapid transit microcatheter. We were able to cross the occlusion easily, with a distal position achieved at the skull base within the cervical ICA. The microwire was removed with saline drip at the hub. Contrast injection confirmed location within the distal cervical ICA. We then placed a soft tip rapid transit wire into the petrous segment and removed the rapid transit catheter. We then proceeded with balloon angioplasty of the proximal ICA using a 4 mm x 40 mm Viatrac rapid exchange system. Patient remained hemodynamically stable with the balloon angioplasty performed. We then gently advanced the balloon guide catheter over the Viatrac balloon at the carotid  bulb upon deflation of the balloon, with a distal cervical ICA position achieved with the balloon guide catheter. Rapid transit wire was removed. The zoom 71 catheter was then advanced over the EMBO trap retrieval wire, and advanced to the skull base. The catheter was advanced to the site of occlusion at the cervical ICA. The proprietary engine was turned on, with stasis of flow at the cavernous segment location. Catheter would not track further over the stent retriever into the terminal ICA segment meeting resistance at the carotid siphon and ophthalmic segment. The working time between deployment of the first EMBO trap and this point was approximately 20-25 minutes. The balloon at the balloon guide catheter was then inflated under fluoroscopy for proximal flow arrest. Constant aspiration using the proprietary engine was then performed at the zoom 71 intermediate catheter, as the retriever was gently and slowly withdrawn with fluoroscopic observation (representing "pass 1" ). Once the retriever was "corked" within the tip of the intermediate catheter, both were removed from the system. Free aspiration was confirmed at the hub of the balloon guide catheter, with free blood return confirmed. The balloon was then deflated, and a control angiogram was performed. 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 Our choice at this point was to re-attempt a thrombectomy/embolectomy (what would be pass number 2) at the carotid siphon. Combination of the synchro soft catheter, Stryker pro 18 microcatheter, and the zoom 71 intermediate catheter were again advanced through the balloon guide. The microcatheter and the microwire were gently advanced through the occlusion within the carotid siphon, again meeting significant resistance through the segment. Once the microwire was within the proximal M1 segment, the microcatheter was advanced to the same position. The intermediate  catheter would not advanced beyond the cavernous segment. Microwire was gently withdrawn from the microcatheter, with the saline drip at the hub. Once the microwire was removed, gentle contrast injection was performed at the microcatheter in the M1 segment position. This gentle injection confirmed that there had been embolization to new territory with a new embolism in the M1 beyond the microcatheter. Microwire was then readvanced through the microcatheter. Microcatheter and microwire were advanced through this new embolism to the M2 segment and the catheter was gently advanced.  Microwire was then withdrawn gently with saline drip at the hub. Once the microwire was removed, gentle contrast injection confirmed a luminal position within the M2 segment. For this second pass, 6 mm solitaire device was selected. Back flush was achieved at the rotating hemostatic valve, and then the device was gently advanced through the microcatheter to the distal end. The retriever was then unsheathed by withdrawing the microcatheter under fluoroscopy. The zoom 71 catheter would not pass beyond the cavernous segment through the ophthalmic segment. Once the retriever was completely unsheathed, the microcatheter was carefully stripped from the delivery device. We then observed a 3 minute interval before withdrawing. The balloon at the balloon guide catheter was then inflated under fluoroscopy for proximal flow arrest. Constant aspiration using the proprietary engine was then performed at the zoom 71 intermediate catheter, positioned in the cavernous segment, as the retriever was gently and slowly withdrawn with fluoroscopic observation. Once the retriever was felt to be "corked" within the tip of the intermediate catheter, both were removed from the system. Free aspiration was confirmed at the hub of the balloon guide catheter, with free blood return confirmed. The balloon was then deflated, and a control angiogram was performed.  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 At this point we decided to follow through with emergent stenting of the known ICA occlusion at the bifurcation/carotid bulb, in an attempt to improve flow through the distal segment. The withdrawal of the balloon guide catheter to the common carotid artery resulted in some instability of the balloon guide through the innominate artery and the aortic arch. The balloon guide then prolapsed into the aortic arch when we reinserted a wire/catheter combination to address the ICA origin. Balloon guide catheter was then withdrawn into the descending thoracic aorta. 125 cm Berenstein catheter was then advanced through the balloon guide. The catheter was then double flushed in the aorta and then advanced over the aortic arch to engage the innominate artery. Glidewire was then advanced into the common carotid artery and the combination of the coaxial diagnostic catheter and the balloon guide were readvanced into the common carotid artery. The wire was advanced into the external carotid artery and the balloon guide was advanced on the Glidewire. Once the balloon guide was into the proximal external carotid artery, the Glidewire and the diagnostic catheter were withdrawn with constant saline drip at the hub. The balloon guide was then withdrawn below the bifurcation, and angiogram was performed. We then crossed the proximal ICA with a combination of the Stryker pro view microcatheter and a synchro soft catheter. Once we achieved a distal position in the cervical ICA, synchro soft wire was removed and a transcend wire was placed. We then deployed a 9 mm x 7 mm by 40 mm tapered Abbott exact stent. The delivered ice was withdrawn. We then post dilated with a rapid exchange 5 mm x 40 mm Viatrac balloon. The 5 mm balloon dilation did result in transient acute bradycardia into the 30s, which quickly  recovered upon deflation of the balloon and withdrawal. No pharmacologic therapy was required. Balloon was withdrawn, and repeat angiogram confirmed continued occlusion at the stent. Our next strategy was for a third attempt at thrombectomy. We then advanced a coaxial Stryker microcatheter through the zoom 71 catheter, and advanced this combination over the Transcend wire. The balloon catheter was then gently advanced through the carotid stent with close observation under fluoroscopy. There was no perceptible  difficulty in advancing through the fresh stent. For this third pass attempt, combination of the synchro soft wire, Stryker pro 18 microcatheter, and the zoom 71 intermediate catheter were again advanced through the balloon guide. The microcatheter and the microwire were gently advanced through the occlusion within the carotid siphon, again meeting significant resistance through the segment. Once the microwire was within the proximal M1 segment, the microcatheter was advanced to the same position. The intermediate catheter would again not advanced beyond the cavernous segment. Microwire was gently withdrawn from the microcatheter, with the saline drip at the hub. Once the microwire was removed, gentle contrast injection was performed at the microcatheter in the M1 segment position. This gentle injection confirmed persistent occlusion in the M1 beyond the microcatheter. Microwire was then readvanced through the microcatheter. Microcatheter and microwire were advanced through this new embolism to the M2 segment and the catheter was gently advanced. Microwire was then withdrawn gently with saline drip at the hub. Once the microwire was removed, gentle contrast injection confirmed a luminal position within the M2 segment. For this third pass, we elected to use the 6 mm solitaire device. Back flush was achieved at the rotating hemostatic valve, and then the device was gently advanced through the microcatheter to the  distal end. The retriever was then unsheathed by withdrawing the microcatheter under fluoroscopy. The zoom 71 catheter would again not advance beyond the cavernous segment through the ophthalmic segment. Once the retriever was completely unsheathed, the microcatheter was carefully stripped from the delivery device. We observed a 3 minutes time interval. The balloon at the balloon guide catheter was then inflated under fluoroscopy for proximal flow arrest. Constant aspiration using the proprietary engine was then performed at the intermediate catheter, as the retriever was gently and slowly withdrawn with fluoroscopic observation. Once the retriever was "corked" within the tip of the intermediate catheter, both were removed from the system. Free aspiration was confirmed at the hub of the balloon guide catheter, with free blood return confirmed. The balloon was then deflated, and a control angiogram was performed. Persistent occlusion of flow was confirmed at the proximal intracranial ICA. Knowing that we needed to prove patency/occlusion of the proximal M1 ENT, we elected to attempt a fourth pass. For this fourth pass attempt, combination of the synchro soft wire, Stryker pro 18 microcatheter, and the zoom 71 intermediate catheter were again advanced through the balloon guide. The microcatheter and the microwire were gently advanced through the vertical and horizontal petrous segment, through the segment lacerum and into the cavernous segment. On this fourth pass, the microwire and the microcatheter would not advance through the ophthalmic segment, meeting significant resistance. With repositioning of both the intermediate catheter and the microcatheter, there was no advantage gained with pushing the microwire through the occlusion, either with a straight/tip configuration or a looped configuration. With further attempt, there was concern for potentially creating dissection or perforation, and we elected to withdraw  at this point an attempt a left-sided angiogram to observe for cross flow. The microcatheter microwire and intermediate catheter were withdrawn from the balloon guide and the balloon guide was withdrawn from the right ICA and the sheath. We then advanced the JB 1 catheter to the aortic arch on the 035 diagnostic wire. Wire was withdrawn and the catheter was double flushed. JB 1 catheter was used to engage the innominate artery, though would not engage the bovine origin of the left common carotid artery. The JB 1 catheter was removed and we placed a  Davis catheter. The Davis catheter was used to engage the innominate artery, and although and angiogram roadmap was performed, the Glidewire would not advanced beyond the tortuous segment of the proximal left common carotid artery. We then required a Sim 2 catheter. Sim 2 catheter recur was formed within the innominate artery, and used to engage the bovine origin of the common carotid artery. Angiogram was performed for roadmap and a Glidewire was advanced into the common carotid artery. Once the Sim 2 catheter was in the common carotid artery, angiogram was performed. This confirmed that there was cross-flow from the left to the right across patent anterior communicating artery, however, the M1 occlusion persisted. Rosen wire was advanced into the common carotid artery, to the bifurcation. Sim catheter was removed. The coaxial Berenstein catheter and a new wall wrist balloon guide catheter were advanced on the Rose an wire. As these were advancing into the common carotid artery, the system prolapsed into the aortic arch. The balloon guide and the intermediate catheter were withdrawn from the sheath. The Sim 2 catheter was again advanced to the proximal descending thoracic aorta. Catheter was double flushed. The Sim 2 curve was then reformed within the left subclavian artery. Catheter was withdrawn into the origin of the left common carotid artery. Glidewire was then  advanced into the cervical ICA. The Sim catheter was then advanced further on this attempt, for a position in the cervical ICA. Once the Sim catheter was in the mid to distal segment the Glidewire was removed and a rose in wire was placed into the distal cervical ICA. Sim catheter was removed. We then elected to place a more trackable Trak-star access catheter with a coaxial Berenstein catheter. The coaxial system was advanced to the distal cervical segment. Roadmap angiogram was performed. A coaxial Stryker Pro 18 catheter, synchro soft wire, and zoom 55 intermediate catheter were advanced to the cavernous segment. We then made an attempt to place the microcatheter across the patent anterior communicating artery, for final attempt at revascularization of the ENT. The origin of the anterior cerebral artery was at such an angle that catheterization was difficult, and after multiple attempts, in order to avoid any primary injury to the left circulation, we elected to withdraw from the case. All catheters and wires were withdrawn. Distal access catheter was withdrawn from the sheath. Flat panel CT was performed. Patient remained intubated, for medical induced coma and observation, with a target blood pressure of permissive hypertension below 180 systolic Patient tolerated the procedure well. Estimated blood loss 500 cc. 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 Left-sided angiogram demonstrates cross fill via patent anterior communicating artery  to the right ACA territory. Angio-Seal for hemostasis Signed, Yvone Neu. Reyne Dumas, RPVI [facility] PLAN: The patient will remain intubated, for medical induced coma purpose. ICU status Target systolic blood pressure of permissive hypertension, less than 180 systolic Right hip straight time 6 hours Frequent neurovascular checks Repeat neurologic imaging with CT and/MRI at the discretion of neurology team Single antiplatelet agent is reasonable, however, the right ICA stent is occluded, and there is likely no need for dual anti-platelet therapy  at this point. Electronically Signed   By: Gilmer Mor D.O.   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 Intubation was performed in negative pressure [place] in neuro IR holding. Left radial arterial line was performed by the anesthesia team. Interventional neuro radiology nursing staff was also present. CONTRAST:  240 cc FLUOROSCOPY TIME:  Fluoroscopy Time: 131 minutes 46 seconds (5866 mGy). 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 New right M1 occlusion, Embolization to [facility] (ENT) TECHNIQUE: Informed written consent was obtained from the patient's family after a thorough discussion of the procedural risks, benefits and alternatives. Specific risks discussed include: Bleeding, infection, contrast reaction, kidney  injury/failure, need for further procedure/surgery, arterial injury or dissection, embolization to new territory, intracranial hemorrhage (10-15% risk), neurologic deterioration, cardiopulmonary collapse, death. All questions were addressed. Maximal Sterile Barrier Technique was utilized including during the procedure including caps, mask, sterile gowns, sterile gloves, sterile drape, hand hygiene and skin antiseptic. A timeout was performed prior to the initiation of the procedure. The anesthesia team was present to provide general endotracheal tube anesthesia and for patient monitoring during the procedure. Interventional neuro radiology nursing staff was also present. FINDINGS: Initial Findings: Type 2 and patulous carotid arch. Innominate artery contributes to origin of the left common carotid artery, in bovine configuration. Right common carotid artery: Normal course caliber and contour. Minimal plaque with no filling defects or irregularity. Right external carotid artery: Patent with antegrade flow. No collateral flow through the middle meningeal pathway, ethmoid pathway, or supraorbital pathway to the right MCA. Right internal carotid artery: The right cervical ICA is occluded at the origin with dense calcified plaque with at least 50% circumferential involvement on the angiogram and the prior CT angiogram. There is absence of flow through the entire right cervical ICA to the skull base Right MCA: Initial angiogram was inadequate for evaluating the right MCA, given the ICA occlusion Right ACA: Initial angiogram inadequate for evaluating right ACA, given the ICA occlusion. Intraprocedural findings: The initial passage of microcatheter through the ICA occlusion was without difficulty, without significant stenosis perceived at the proximal cervical ICA. The initial and subsequent passages of the microwire and microcatheter through the cavernous segment, ophthalmic segment/supra ophthalmic segment and terminus  was quite difficult given the character of the occlusion in the carotid siphon. The first microcatheter position within the proximal M1, just distal to the bifurcation, demonstrates slip-streaming artifact from the cross-filling ACA territory with patency of the lenticulostriate arteries demonstrated. The first deployment of EMBO trap in this initial catheter position at the right terminus segment. Initial 4 mm balloon angioplasty of the right carotid bulb demonstrated no perceivable waist on the 4 mm balloon, which was inflated to 14 atmospheres without a physiologic response. After the initial EMBO trap deployment, balloon angioplasty, and then retrieval of the EMBO trap through the intermediate catheter and balloon guide, there was persistent occlusion of the ICA through the petrous segment class oral segment to the siphon. The next placement of the microcatheter into the proximal M1 segment demonstrated embolization new territory to the M1. The  second deployment stent retriever, the 6 mm solitaire was across the M1 segment and into the terminus segment. After withdrawal of the solitary catheter angiogram confirmed no flow through the cervical segment. After placement of 9 mm-7 mm ICA carotid stent, there was persistent occlusion of the cervical ICA. After third deployment of stent retriever, 6 mm solitaire across the M1 segment and terminus segment and withdrawal there is no flow through the cervical ICA segment. Therefore assessment of the MCA cannot be performed from the right. Of note, with each further navigation of the micro wire and the microcatheter through the carotid siphon there was increasing difficulty. This may be interpreted as either persisting dense/fibrous embolism and/or dissection and/or chronic stenosis within the carotid siphon secondary to intracranial atherosclerosis. At this point, left cervical/cerebral angiogram was performed, findings below. Final left-sided angiogram demonstrates  patency of the anterior communicating artery, with filling of the right ACA territory and leptomeningeal collateral formation towards the MCA watershed, with a persisting right M1 occlusion. Completion Findings: Right MCA: M1 occlusion TICI: TICI 0 Left common carotid artery: Tortuous course of the proximal left common carotid artery, with bovine configuration origin from a type 2 arch. Left external carotid artery: Patent with antegrade flow. Left internal carotid artery: Irregular changes at the left carotid bulb, with a given history of prior carotid endarterectomy. No flow limiting stenosis is present. 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 There is also a focal outpouching on the posterior proximal cervical ICA, without filling defect. Relatively straight course of the left cervical ICA. Vertical and petrous segment patent with normal course caliber contour. Cavernous segment patent. Clinoid segment patent. Antegrade flow of the ophthalmic artery. Ophthalmic segment patent. Terminus patent. Left MCA: M1 segment patent. Insular and opercular segments patent. Unremarkable caliber and course of the cortical segments. Typical arterial, capillary/ parenchymal, and venous phase. Left ACA: A1 origin is lateral to the carotid siphon with a long A1 segment. The anterior communicating artery is patent, with cross flow into the right-sided hemisphere. The right A1 is normal caliber, with cross fill into the right ACA territory. 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 PROCEDURE: The anesthesia team was present to provide general endotracheal tube anesthesia and for patient monitoring during the procedure. Intubation was performed in negative pressure [place] in neuro IR holding. Interventional neuro radiology nursing staff was also present. Ultrasound survey of the  right inguinal region was performed with images stored and sent to PACs. 11 blade scalpel was used to make a small incision. Blunt dissection was performed with US guidance. A micropuncture needle was used access the right common femoral artery under ultrasound. With excellent arterial blood flow returned, an .018 micro wire was passed through the needle, observed to enter the abdominal aorta under fluoroscopy. The needle was removed, and a micropuncture sheath was placed over the wire. The inner dilator and wire were removed, and an 035 wire was advanced under fluoroscopy into the abdominal aorta. The sheath was removed and a 25cm 35F straight vascular sheath was placed. The dilator was removed and the sheath was flushed. Sheath was attached to pressurized and heparinized saline bag for constant forward flow. A coaxial system was then advanced over the 035 wire. This included a 95cm 087 "[place]" balloon guide with coaxial 125cm Berenstein diagnostic catheter. This was advanced to the proximal descending thoracic  aorta. Wire was then removed. Double flush of the catheter was performed. Catheter was then used to select the innominate artery. Angiogram was performed. Using roadmap technique, the catheter was advanced over a standard glide wire into right common carotid artery, using a wire position in the external carotid artery, achieving position of the balloon guide catheter in the common carotid artery. The diagnostic catheter and the wire were removed. Formal angiogram was performed. Road map function was used once the occluded vessel was identified. Copious back flush was performed and the balloon catheter was attached to heparinized and pressurized saline bag for forward flow. A second coaxial system was then advanced through the balloon catheter, which included the selected intermediate catheter, microcatheter, and microwire. In this scenario, the set up included a 137cm zoom 71 intermediate catheter, a rapid  transit microcatheter, and 014 synchro soft wire. This system was advanced through the balloon guide catheter under the road-map function, with adequate back-flush at the rotating hemostatic valve at that back end of the balloon guide. The microwire was advanced through the occlusion in the proximal cervical ICA, with behavior of the microwire and the medical catheter suggesting a patent ICA with a luminal position. The intermediate catheter tract easily over the microcatheter into the distal cervical ICA. The rapid transit and the microwire were advanced to the cavernous segment of the intracranial ICA. Intermediate catheter remained at the skull base. Microcatheter was then gently advanced with a loop configuration through the carotid sinus, observing the wire to enter the proximal MCA under fluoroscopy. The microcatheter encountered resistance in its course through the carotid siphon and into the proximal right MCA. The wire was removed with saline drip at the hub. Blood was then aspirated through the hub of the microcatheter, and a gentle contrast injection was performed confirming intraluminal position in the proximal M1 segment, just beyond the terminus. Slip streaming artifact confirmed inflow of the contralateral unopacified blood through the patent anterior communicating artery. A rotating hemostatic valve was then attached to the back end of the microcatheter, and a pressurized and heparinized saline bag was attached to the catheter. 6.36mm x 45mm Embotrap stent retreiver device was then selected. Back flush was achieved at the rotating hemostatic valve, and then the device was gently advanced through the microcatheter to the distal end. The retriever was then unsheathed by withdrawing the microcatheter under fluoroscopy, targeting the most distal aspect terminating within the ICA terminus segment proximal to the bifurcation. Once the retriever was completely unsheathed, the microcatheter was carefully  stripped from the delivery device. At this time, with the knowledge of the inflow from the patent anterior communicating artery and the A1 segment, the EMBO trap device was left in place while we addressed the occluded proximal ICA and the presumable permissive lesion at the ICA origin. A second and parallel synchro soft microwire was advanced through the balloon guide catheter using the rapid transit microcatheter. We were able to cross the occlusion easily, with a distal position achieved at the skull base within the cervical ICA. The microwire was removed with saline drip at the hub. Contrast injection confirmed location within the distal cervical ICA. We then placed a soft tip rapid transit wire into the petrous segment and removed the rapid transit catheter. We then proceeded with balloon angioplasty of the proximal ICA using a 4 mm x 40 mm Viatrac rapid exchange system. Patient remained hemodynamically stable with the balloon angioplasty performed. We then gently advanced the balloon guide catheter over the [facility]  balloon at the carotid bulb upon deflation of the balloon, with a distal cervical ICA position achieved with the balloon guide catheter. Rapid transit wire was removed. The zoom 71 catheter was then advanced over the EMBO trap retrieval wire, and advanced to the skull base. The catheter was advanced to the site of occlusion at the cervical ICA. The proprietary engine was turned on, with stasis of flow at the cavernous segment location. Catheter would not track further over the stent retriever into the terminal ICA segment meeting resistance at the carotid siphon and ophthalmic segment. The working time between deployment of the first EMBO trap and this point was approximately 20-25 minutes. The balloon at the balloon guide catheter was then inflated under fluoroscopy for proximal flow arrest. Constant aspiration using the proprietary engine was then performed at the zoom 71 intermediate catheter, as  the retriever was gently and slowly withdrawn with fluoroscopic observation (representing "pass 1" ). Once the retriever was "corked" within the tip of the intermediate catheter, both were removed from the system. Free aspiration was confirmed at the hub of the balloon guide catheter, with free blood return confirmed. The balloon was then deflated, and a control angiogram was performed. 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 Our choice at this point was to re-attempt a thrombectomy/embolectomy (what would be pass number 2) at the carotid siphon. Combination of the synchro soft catheter, Stryker pro 18 microcatheter, and the zoom 71 intermediate catheter were again advanced through the balloon guide. The microcatheter and the microwire were gently advanced through the occlusion within the carotid siphon, again meeting significant resistance through the segment. Once the microwire was within the proximal M1 segment, the microcatheter was advanced to the same position. The intermediate catheter would not advanced beyond the cavernous segment. Microwire was gently withdrawn from the microcatheter, with the saline drip at the hub. Once the microwire was removed, gentle contrast injection was performed at the microcatheter in the M1 segment position. This gentle injection confirmed that there had been embolization to new territory with a new embolism in the M1 beyond the microcatheter. Microwire was then readvanced through the microcatheter. Microcatheter and microwire were advanced through this new embolism to the M2 segment and the catheter was gently advanced. Microwire was then withdrawn gently with saline drip at the hub. Once the microwire was removed, gentle contrast injection confirmed a luminal position within the M2 segment. For this second pass, 6 mm solitaire device was selected. Back flush was achieved at the rotating hemostatic valve, and then the  device was gently advanced through the microcatheter to the distal end. The retriever was then unsheathed by withdrawing the microcatheter under fluoroscopy. The zoom 71 catheter would not pass beyond the cavernous segment through the ophthalmic segment. Once the retriever was completely unsheathed, the microcatheter was carefully stripped from the delivery device. We then observed a 3 minute interval before withdrawing. The balloon at the balloon guide catheter was then inflated under fluoroscopy for proximal flow arrest. Constant aspiration using the proprietary engine was then performed at the zoom 71 intermediate catheter, positioned in the cavernous segment, as the retriever was gently and slowly withdrawn with fluoroscopic observation. Once the retriever was felt to be "corked" within the tip of the intermediate catheter, both were removed from the system. Free aspiration was confirmed at the hub of the balloon guide catheter, with free blood return confirmed. The balloon was then deflated, and a control angiogram was performed. Persisting  occlusion at the proximal intracranial ICA was again noted, with the inability to assess the intracranial circulation. At this point we decided to follow through with emergent stenting of the known ICA occlusion at the bifurcation/carotid bulb, in an attempt to improve flow through the distal segment. The withdrawal of the balloon guide catheter to the common carotid artery resulted in some instability of the balloon guide through the innominate artery and the aortic arch. The balloon guide then prolapsed into the aortic arch when we reinserted a wire/catheter combination to address the ICA origin. Balloon guide catheter was then withdrawn into the descending thoracic aorta. 125 cm Berenstein catheter was then advanced through the balloon guide. The catheter was then double flushed in the aorta and then advanced over the aortic arch to engage the innominate artery. Glidewire  was then advanced into the common carotid artery and the combination of the coaxial diagnostic catheter and the balloon guide were readvanced into the common carotid artery. The wire was advanced into the external carotid artery and the balloon guide was advanced on the Glidewire. Once the balloon guide was into the proximal external carotid artery, the Glidewire and the diagnostic catheter were withdrawn with constant saline drip at the hub. The balloon guide was then withdrawn below the bifurcation, and angiogram was performed. We then crossed the proximal ICA with a combination of the Stryker pro view microcatheter and a synchro soft catheter. Once we achieved a distal position in the cervical ICA, synchro soft wire was removed and a transcend wire was placed. We then deployed a 9 mm x 7 mm by 40 mm tapered Abbott exact stent. The delivered ice was withdrawn. We then post dilated with a rapid exchange 5 mm x 40 mm Viatrac balloon. The 5 mm balloon dilation did result in transient acute bradycardia into the 30s, which quickly recovered upon deflation of the balloon and withdrawal. No pharmacologic therapy was required. Balloon was withdrawn, and repeat angiogram confirmed continued occlusion at the stent. Our next strategy was for a third attempt at thrombectomy. We then advanced a coaxial Stryker microcatheter through the zoom 71 catheter, and advanced this combination over the Transcend wire. The balloon catheter was then gently advanced through the carotid stent with close observation under fluoroscopy. There was no perceptible difficulty in advancing through the fresh stent. For this third pass attempt, combination of the synchro soft wire, Stryker pro 18 microcatheter, and the zoom 71 intermediate catheter were again advanced through the balloon guide. The microcatheter and the microwire were gently advanced through the occlusion within the carotid siphon, again meeting significant resistance through the  segment. Once the microwire was within the proximal M1 segment, the microcatheter was advanced to the same position. The intermediate catheter would again not advanced beyond the cavernous segment. Microwire was gently withdrawn from the microcatheter, with the saline drip at the hub. Once the microwire was removed, gentle contrast injection was performed at the microcatheter in the M1 segment position. This gentle injection confirmed persistent occlusion in the M1 beyond the microcatheter. Microwire was then readvanced through the microcatheter. Microcatheter and microwire were advanced through this new embolism to the M2 segment and the catheter was gently advanced. Microwire was then withdrawn gently with saline drip at the hub. Once the microwire was removed, gentle contrast injection confirmed a luminal position within the M2 segment. For this third pass, we elected to use the 6 mm solitaire device. Back flush was achieved at the rotating hemostatic valve, and then the  device was gently advanced through the microcatheter to the distal end. The retriever was then unsheathed by withdrawing the microcatheter under fluoroscopy. The zoom 71 catheter would again not advance beyond the cavernous segment through the ophthalmic segment. Once the retriever was completely unsheathed, the microcatheter was carefully stripped from the delivery device. We observed a 3 minutes time interval. The balloon at the balloon guide catheter was then inflated under fluoroscopy for proximal flow arrest. Constant aspiration using the proprietary engine was then performed at the intermediate catheter, as the retriever was gently and slowly withdrawn with fluoroscopic observation. Once the retriever was "corked" within the tip of the intermediate catheter, both were removed from the system. Free aspiration was confirmed at the hub of the balloon guide catheter, with free blood return confirmed. The balloon was then deflated, and a control  angiogram was performed. Persistent occlusion of flow was confirmed at the proximal intracranial ICA. Knowing that we needed to prove patency/occlusion of the proximal M1 ENT, we elected to attempt a fourth pass. For this fourth pass attempt, combination of the synchro soft wire, Stryker pro 18 microcatheter, and the zoom 71 intermediate catheter were again advanced through the balloon guide. The microcatheter and the microwire were gently advanced through the vertical and horizontal petrous segment, through the segment lacerum and into the cavernous segment. On this fourth pass, the microwire and the microcatheter would not advance through the ophthalmic segment, meeting significant resistance. With repositioning of both the intermediate catheter and the microcatheter, there was no advantage gained with pushing the microwire through the occlusion, either with a straight/tip configuration or a looped configuration. With further attempt, there was concern for potentially creating dissection or perforation, and we elected to withdraw at this point an attempt a left-sided angiogram to observe for cross flow. The microcatheter microwire and intermediate catheter were withdrawn from the balloon guide and the balloon guide was withdrawn from the right ICA and the sheath. We then advanced the JB 1 catheter to the aortic arch on the 035 diagnostic wire. Wire was withdrawn and the catheter was double flushed. JB 1 catheter was used to engage the innominate artery, though would not engage the bovine origin of the left common carotid artery. The JB 1 catheter was removed and we placed a Davis catheter. The Davis catheter was used to engage the innominate artery, and although and angiogram roadmap was performed, the Glidewire would not advanced beyond the tortuous segment of the proximal left common carotid artery. We then required a Sim 2 catheter. Sim 2 catheter recur was formed within the innominate artery, and used to  engage the bovine origin of the common carotid artery. Angiogram was performed for roadmap and a Glidewire was advanced into the common carotid artery. Once the Sim 2 catheter was in the common carotid artery, angiogram was performed. This confirmed that there was cross-flow from the left to the right across patent anterior communicating artery, however, the M1 occlusion persisted. Rosen wire was advanced into the common carotid artery, to the bifurcation. Sim catheter was removed. The coaxial Berenstein catheter and a new wall wrist balloon guide catheter were advanced on the Rose an wire. As these were advancing into the common carotid artery, the system prolapsed into the aortic arch. The balloon guide and the intermediate catheter were withdrawn from the sheath. The Sim 2 catheter was again advanced to the proximal descending thoracic aorta. Catheter was double flushed. The Sim 2 curve was then reformed within the left subclavian artery.  Catheter was withdrawn into the origin of the left common carotid artery. Glidewire was then advanced into the cervical ICA. The Sim catheter was then advanced further on this attempt, for a position in the cervical ICA. Once the Sim catheter was in the mid to distal segment the Glidewire was removed and a rose in wire was placed into the distal cervical ICA. Sim catheter was removed. We then elected to place a more trackable Trak-star access catheter with a coaxial Berenstein catheter. The coaxial system was advanced to the distal cervical segment. Roadmap angiogram was performed. A coaxial Stryker Pro 18 catheter, synchro soft wire, and zoom 55 intermediate catheter were advanced to the cavernous segment. We then made an attempt to place the microcatheter across the patent anterior communicating artery, for final attempt at revascularization of the ENT. The origin of the anterior cerebral artery was at such an angle that catheterization was difficult, and after multiple  attempts, in order to avoid any primary injury to the left circulation, we elected to withdraw from the case. All catheters and wires were withdrawn. Distal access catheter was withdrawn from the sheath. Flat panel CT was performed. Patient remained intubated, for medical induced coma and observation, with a target blood pressure of permissive hypertension below 180 systolic Patient tolerated the procedure well. Estimated blood loss 500 cc. 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 Left-sided angiogram demonstrates cross fill via patent anterior communicating artery to the right ACA territory. Angio-Seal for hemostasis Signed, Yvone Neu. Reyne Dumas, RPVI [facility] Radiology PLAN: The patient will remain intubated, for medical induced coma purpose. ICU status Target systolic blood pressure of permissive hypertension, less than 180 systolic Right hip straight time 6 hours Frequent neurovascular checks Repeat neurologic imaging with CT and/MRI at the discretion of neurology team Single antiplatelet agent is reasonable, however, the right ICA stent is occluded, and there is likely no need for dual anti-platelet therapy at this point. Electronically Signed   By: Gilmer Mor D.O.   On: 03/02/2020 13:44   CT CEREBRAL PERFUSION W CONTRAST  Result Date: 03/29/20 CLINICAL DATA:  Left-sided weakness EXAM: CT PERFUSION BRAIN TECHNIQUE: Multiphase CT imaging of the brain was performed following IV  bolus contrast injection. Subsequent parametric perfusion maps were calculated using RAPID software. CONTRAST:  40mL OMNIPAQUE IOHEXOL 350 MG/ML SOLN COMPARISON:  None. FINDINGS: CT Brain Perfusion Findings: CBF (<30%) Volume: 0mL Perfusion (Tmax>6.0s) volume: Mismatch Volume: 06/01/2046mL ASPECTS on noncontrast CT Head: 10 at 1:19 p.m. today. Infarct Core: 0 mL Infarction Location: Right MCA territory and watershed territories. IMPRESSION: No core infarction. Large area of territory at risk throughout the right MCA territory as well as MCA/ACA and MCA/PCA watershed territories. Electronically Signed   By: Guadlupe Spanish M.D.   On: 2020/03/29 13:55   DG CHEST PORT 1 VIEW  Result Date: 03/03/2020 CLINICAL DATA:  Fever EXAM: PORTABLE CHEST 1 VIEW COMPARISON:  2020/03/29 FINDINGS: Endotracheal tube is been removed. Enteric tube is in place with tip below the left hemidiaphragm but off the field of view. Cardiac enlargement. Probable small left pleural effusion. Infiltration or atelectasis in the left base. Hazy  interstitial changes in the lungs may represent edema. Calcification of the aorta. IMPRESSION: Cardiac enlargement with probable small left pleural effusion and infiltration or atelectasis in the left base. Hazy interstitial changes in the lungs may represent edema. Electronically Signed   By: Burman Nieves M.D.   On: 03/03/2020 07:10   DG Chest Port 1 View  Result Date: March 20, 2020 CLINICAL DATA:  Stroke-like symptoms. EXAM: PORTABLE CHEST 1 VIEW COMPARISON:  May 23, 2019 FINDINGS: An endotracheal tube is seen with its distal tip approximately 5.7 cm from the carina. A nasogastric tube is present. Its distal end extends below the level of the diaphragm. The cardiac silhouette is mildly enlarged. There is moderate severity calcification of the aortic arch. Degenerative changes seen throughout the thoracic spine. IMPRESSION: 1. Endotracheal tube and nasogastric tube in good position. 2. No  acute infiltrate. Electronically Signed   By: Aram Candela M.D.   On: Mar 20, 2020 20:26   DG Abd Portable 1V  Result Date: 03/20/2020 CLINICAL DATA:  Gastric tube placement EXAM: PORTABLE ABDOMEN - 1 VIEW COMPARISON:  None FINDINGS: Gastric tube in the proximal stomach. Side port likely just above the GE junction. Basilar atelectasis. No effusion. Paucity of gas in the abdomen. Excreted contrast in the collecting systems of bilateral kidneys. No acute skeletal process to the extent evaluated. IMPRESSION: Gastric tube in the proximal stomach with side port likely just above the GE junction. Consider advancing a few more centimeters to ensure intragastric positioning. These results will be called to the ordering clinician or representative by the Radiologist Assistant, and communication documented in the PACS or [employer]. Electronically Signed   By: Donzetta Kohut M.D.   On: March 20, 2020 21:23   ECHOCARDIOGRAM COMPLETE  Result Date: 03/01/2020    ECHOCARDIOGRAM REPORT   Patient Name:   CHARLTON BOULE Date of Exam: 03/01/2020 Medical Rec #:  [id number]   Height:       66.0 in Accession #:    [id number]  Weight:       191.8 lb Date of Birth:  [birth year]   BSA:          1.965 m Patient Age:    79 years    BP:           138/67 mmHg Patient Gender: M           HR:           91 bpm. Exam Location:  Inpatient Procedure: 2D Echo, Color Doppler and Cardiac Doppler Indications:    Stroke i163.9  History:        Patient has no prior history of Echocardiogram examinations.                 CAD, Arrythmias:Atrial Fibrillation; Risk Factors:Hypertension,                 Diabetes and Dyslipidemia.  Sonographer:    Irving Burton Senior RDCS Referring Phys: [id number] [facility]  Sonographer Comments: Scanned supine on artificial respirator. IMPRESSIONS  1. Left ventricular ejection fraction, by estimation, is 60 to 65%. The left ventricle has normal function. The left ventricle has no regional wall motion abnormalities.  There is mild left ventricular hypertrophy. Left ventricular diastolic parameters are indeterminate.  2. Right ventricular systolic function is normal. The right ventricular size is mildly enlarged. There is mildly elevated pulmonary artery systolic pressure. The estimated right ventricular systolic pressure is 37.4 mmHg.  3. Left atrial size was moderately dilated.  4. Right atrial size was moderately  dilated.  5. The mitral valve is normal in structure. Trivial mitral valve regurgitation. No evidence of mitral stenosis.  6. Tricuspid valve regurgitation is moderate.  7. The aortic valve is calcified. There is moderate calcification of the aortic valve. There is moderate thickening of the aortic valve. Aortic valve regurgitation is not visualized. Mild aortic valve stenosis. Aortic valve mean gradient measures 15.6 mmHg. Aortic valve Vmax measures 2.56 m/s.  8. The inferior vena cava is dilated in size with >50% respiratory variability, suggesting right atrial pressure of 8 mmHg. FINDINGS  Left Ventricle: Left ventricular ejection fraction, by estimation, is 60 to 65%. The left ventricle has normal function. The left ventricle has no regional wall motion abnormalities. The left ventricular internal cavity size was normal in size. There is  mild left ventricular hypertrophy. Left ventricular diastolic parameters are indeterminate. Right Ventricle: The right ventricular size is mildly enlarged. No increase in right ventricular wall thickness. Right ventricular systolic function is normal. There is mildly elevated pulmonary artery systolic pressure. The tricuspid regurgitant velocity is 2.71 m/s, and with an assumed right atrial pressure of 8 mmHg, the estimated right ventricular systolic pressure is 37.4 mmHg. Left Atrium: Left atrial size was moderately dilated. Right Atrium: Right atrial size was moderately dilated. Pericardium: There is no evidence of pericardial effusion. Mitral Valve: The mitral valve is normal  in structure. Trivial mitral valve regurgitation. No evidence of mitral valve stenosis. Tricuspid Valve: The tricuspid valve is normal in structure. Tricuspid valve regurgitation is moderate . No evidence of tricuspid stenosis. Aortic Valve: The aortic valve is calcified. There is moderate calcification of the aortic valve. There is moderate thickening of the aortic valve. Aortic valve regurgitation is not visualized. Mild aortic stenosis is present. Aortic valve mean gradient measures 15.6 mmHg. Aortic valve peak gradient measures 26.3 mmHg. Aortic valve area, by VTI measures 0.91 cm. Pulmonic Valve: The pulmonic valve was normal in structure. Pulmonic valve regurgitation is not visualized. No evidence of pulmonic stenosis. Aorta: The aortic root is normal in size and structure. Venous: The inferior vena cava is dilated in size with greater than 50% respiratory variability, suggesting right atrial pressure of 8 mmHg. IAS/Shunts: No atrial level shunt detected by color flow Doppler.  LEFT VENTRICLE PLAX 2D LVIDd:         3.70 cm LVIDs:         1.80 cm LV PW:         1.20 cm LV IVS:        1.30 cm LVOT diam:     2.00 cm LV SV:         43 LV SV Index:   22 LVOT Area:     3.14 cm  RIGHT VENTRICLE RV S prime:     8.16 cm/s TAPSE (M-mode): 1.3 cm LEFT ATRIUM             Index       RIGHT ATRIUM           Index LA diam:        5.10 cm 2.60 cm/m  RA Area:     21.30 cm LA Vol (A2C):   83.2 ml 42.35 ml/m RA Volume:   63.50 ml  32.32 ml/m LA Vol (A4C):   90.4 ml 46.02 ml/m LA Biplane Vol: 88.4 ml 45.00 ml/m  AORTIC VALVE AV Area (Vmax):    0.85 cm AV Area (Vmean):   0.89 cm AV Area (VTI):     0.91 cm AV  Vmax:           256.20 cm/s AV Vmean:          185.400 cm/s AV VTI:            0.476 m AV Peak Grad:      26.3 mmHg AV Mean Grad:      15.6 mmHg LVOT Vmax:         69.13 cm/s LVOT Vmean:        52.700 cm/s LVOT VTI:          0.137 m LVOT/AV VTI ratio: 0.29  AORTA Ao Root diam: 3.70 cm Ao Asc diam:  3.70 cm TRICUSPID  VALVE TR Peak grad:   29.4 mmHg TR Vmax:        271.00 cm/s  SHUNTS Systemic VTI:  0.14 m Systemic Diam: 2.00 cm Donato Schultz MD Electronically signed by Donato Schultz MD Signature Date/Time: 03/01/2020/2:44:19 PM    Final    IR PERCUTANEOUS ART THROMBECTOMY/INFUSION INTRACRANIAL INC DIAG ANGIO  Result Date: 03/02/2020 INDICATION: 79 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 Intubation was performed in negative pressure [place] in neuro IR holding. Left radial arterial line was performed by the anesthesia team. Interventional neuro radiology nursing staff was also present. CONTRAST:  240 cc FLUOROSCOPY TIME:  Fluoroscopy Time: 131 minutes 46 seconds (5866 mGy). 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 New right M1 occlusion, Embolization to [facility] (ENT) TECHNIQUE: Informed written consent was obtained from the patient's family after a thorough discussion of the procedural risks, benefits and alternatives. Specific risks discussed include: Bleeding, infection, contrast reaction, kidney injury/failure, need for further procedure/surgery, arterial injury or dissection, embolization to new territory, intracranial hemorrhage (10-15% risk), neurologic deterioration, cardiopulmonary collapse, death. All questions were addressed. Maximal Sterile Barrier Technique was utilized including during the procedure including caps, mask, sterile gowns, sterile gloves, sterile drape, hand hygiene and skin antiseptic. A timeout was performed  prior to the initiation of the procedure. The anesthesia team was present to provide general endotracheal tube anesthesia and for patient monitoring during the procedure. Interventional neuro radiology nursing staff was also present. FINDINGS: Initial Findings: Type 2 and patulous carotid arch. Innominate artery contributes to origin of the left common carotid artery, in bovine configuration. Right common carotid artery: Normal course caliber and contour. Minimal plaque with no filling defects or irregularity. Right external carotid artery: Patent with antegrade flow. No collateral flow through the middle meningeal pathway, ethmoid pathway, or supraorbital pathway to the right MCA. Right internal carotid artery: The right cervical ICA is occluded at the origin with dense calcified plaque with at least 50% circumferential involvement on the angiogram and the prior CT angiogram. There is absence of flow through the entire right cervical ICA to the skull base Right MCA: Initial angiogram was inadequate for evaluating the right MCA, given the ICA occlusion Right ACA: Initial angiogram inadequate for evaluating right ACA, given the ICA occlusion. Intraprocedural findings: The initial passage of microcatheter through the ICA occlusion was without difficulty, without significant stenosis perceived at the proximal cervical ICA. The initial and subsequent passages of the microwire and microcatheter through the cavernous segment, ophthalmic segment/supra  ophthalmic segment and terminus was quite difficult given the character of the occlusion in the carotid siphon. The first microcatheter position within the proximal M1, just distal to the bifurcation, demonstrates slip-streaming artifact from the cross-filling ACA territory with patency of the lenticulostriate arteries demonstrated. The first deployment of EMBO trap in this initial catheter position at the right terminus segment. Initial 4 mm balloon angioplasty of the right  carotid bulb demonstrated no perceivable waist on the 4 mm balloon, which was inflated to 14 atmospheres without a physiologic response. After the initial EMBO trap deployment, balloon angioplasty, and then retrieval of the EMBO trap through the intermediate catheter and balloon guide, there was persistent occlusion of the ICA through the petrous segment class oral segment to the siphon. The next placement of the microcatheter into the proximal M1 segment demonstrated embolization new territory to the M1. The second deployment stent retriever, the 6 mm solitaire was across the M1 segment and into the terminus segment. After withdrawal of the solitary catheter angiogram confirmed no flow through the cervical segment. After placement of 9 mm-7 mm ICA carotid stent, there was persistent occlusion of the cervical ICA. After third deployment of stent retriever, 6 mm solitaire across the M1 segment and terminus segment and withdrawal there is no flow through the cervical ICA segment. Therefore assessment of the MCA cannot be performed from the right. Of note, with each further navigation of the micro wire and the microcatheter through the carotid siphon there was increasing difficulty. This may be interpreted as either persisting dense/fibrous embolism and/or dissection and/or chronic stenosis within the carotid siphon secondary to intracranial atherosclerosis. At this point, left cervical/cerebral angiogram was performed, findings below. Final left-sided angiogram demonstrates patency of the anterior communicating artery, with filling of the right ACA territory and leptomeningeal collateral formation towards the MCA watershed, with a persisting right M1 occlusion. Completion Findings: Right MCA: M1 occlusion TICI: TICI 0 Left common carotid artery: Tortuous course of the proximal left common carotid artery, with bovine configuration origin from a type 2 arch. Left external carotid artery: Patent with antegrade flow. Left  internal carotid artery: Irregular changes at the left carotid bulb, with a given history of prior carotid endarterectomy. No flow limiting stenosis is present. 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 There is also a focal outpouching on the posterior proximal cervical ICA, without filling defect. Relatively straight course of the left cervical ICA. Vertical and petrous segment patent with normal course caliber contour. Cavernous segment patent. Clinoid segment patent. Antegrade flow of the ophthalmic artery. Ophthalmic segment patent. Terminus patent. Left MCA: M1 segment patent. Insular and opercular segments patent. Unremarkable caliber and course of the cortical segments. Typical arterial, capillary/ parenchymal, and venous phase. Left ACA: A1 origin is lateral to the carotid siphon with a long A1 segment. The anterior communicating artery is patent, with cross flow into the right-sided hemisphere. The right A1 is normal caliber, with cross fill into the right ACA territory. 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 PROCEDURE: The anesthesia team was present to provide general endotracheal tube anesthesia and for patient monitoring during the procedure. Intubation was performed in negative pressure [place] in neuro IR holding. Interventional neuro radiology nursing staff was also present. Ultrasound survey of the right inguinal region was performed with images stored and sent to PACs. 11 blade scalpel was  used to make a small incision. Blunt dissection was performed with US guidance. A micropuncture needle was used access the right common femoral artery under ultrasound. With excellent arterial blood flow returned, an .018 micro wire was passed through the needle, observed to enter the abdominal aorta under fluoroscopy. The needle was removed, and a  micropuncture sheath was placed over the wire. The inner dilator and wire were removed, and an 035 wire was advanced under fluoroscopy into the abdominal aorta. The sheath was removed and a 25cm 58F straight vascular sheath was placed. The dilator was removed and the sheath was flushed. Sheath was attached to pressurized and heparinized saline bag for constant forward flow. A coaxial system was then advanced over the 035 wire. This included a 95cm 087 "Walrus" balloon guide with coaxial 125cm Berenstein diagnostic catheter. This was advanced to the proximal descending thoracic aorta. Wire was then removed. Double flush of the catheter was performed. Catheter was then used to select the innominate artery. Angiogram was performed. Using roadmap technique, the catheter was advanced over a standard glide wire into right common carotid artery, using a wire position in the external carotid artery, achieving position of the balloon guide catheter in the common carotid artery. The diagnostic catheter and the wire were removed. Formal angiogram was performed. Road map function was used once the occluded vessel was identified. Copious back flush was performed and the balloon catheter was attached to heparinized and pressurized saline bag for forward flow. A second coaxial system was then advanced through the balloon catheter, which included the selected intermediate catheter, microcatheter, and microwire. In this scenario, the set up included a 137cm zoom 71 intermediate catheter, a rapid transit microcatheter, and 014 synchro soft wire. This system was advanced through the balloon guide catheter under the road-map function, with adequate back-flush at the rotating hemostatic valve at that back end of the balloon guide. The microwire was advanced through the occlusion in the proximal cervical ICA, with behavior of the microwire and the medical catheter suggesting a patent ICA with a luminal position. The intermediate catheter  tract easily over the microcatheter into the distal cervical ICA. The rapid transit and the microwire were advanced to the cavernous segment of the intracranial ICA. Intermediate catheter remained at the skull base. Microcatheter was then gently advanced with a loop configuration through the carotid sinus, observing the wire to enter the proximal MCA under fluoroscopy. The microcatheter encountered resistance in its course through the carotid siphon and into the proximal right MCA. The wire was removed with saline drip at the hub. Blood was then aspirated through the hub of the microcatheter, and a gentle contrast injection was performed confirming intraluminal position in the proximal M1 segment, just beyond the terminus. Slip streaming artifact confirmed inflow of the contralateral unopacified blood through the patent anterior communicating artery. A rotating hemostatic valve was then attached to the back end of the microcatheter, and a pressurized and heparinized saline bag was attached to the catheter. 6.35mm x 45mm Embotrap stent retreiver device was then selected. Back flush was achieved at the rotating hemostatic valve, and then the device was gently advanced through the microcatheter to the distal end. The retriever was then unsheathed by withdrawing the microcatheter under fluoroscopy, targeting the most distal aspect terminating within the ICA terminus segment proximal to the bifurcation. Once the retriever was completely unsheathed, the microcatheter was carefully stripped from the delivery device. At this time, with the knowledge of the inflow from the patent anterior communicating  artery and the A1 segment, the EMBO trap device was left in place while we addressed the occluded proximal ICA and the presumable permissive lesion at the ICA origin. A second and parallel synchro soft microwire was advanced through the balloon guide catheter using the rapid transit microcatheter. We were able to cross the  occlusion easily, with a distal position achieved at the skull base within the cervical ICA. The microwire was removed with saline drip at the hub. Contrast injection confirmed location within the distal cervical ICA. We then placed a soft tip rapid transit wire into the petrous segment and removed the rapid transit catheter. We then proceeded with balloon angioplasty of the proximal ICA using a 4 mm x 40 mm Viatrac rapid exchange system. Patient remained hemodynamically stable with the balloon angioplasty performed. We then gently advanced the balloon guide catheter over the Viatrac balloon at the carotid bulb upon deflation of the balloon, with a distal cervical ICA position achieved with the balloon guide catheter. Rapid transit wire was removed. The zoom 71 catheter was then advanced over the EMBO trap retrieval wire, and advanced to the skull base. The catheter was advanced to the site of occlusion at the cervical ICA. The proprietary engine was turned on, with stasis of flow at the cavernous segment location. Catheter would not track further over the stent retriever into the terminal ICA segment meeting resistance at the carotid siphon and ophthalmic segment. The working time between deployment of the first EMBO trap and this point was approximately 20-25 minutes. The balloon at the balloon guide catheter was then inflated under fluoroscopy for proximal flow arrest. Constant aspiration using the proprietary engine was then performed at the zoom 71 intermediate catheter, as the retriever was gently and slowly withdrawn with fluoroscopic observation (representing "pass 1" ). Once the retriever was "corked" within the tip of the intermediate catheter, both were removed from the system. Free aspiration was confirmed at the hub of the balloon guide catheter, with free blood return confirmed. The balloon was then deflated, and a control angiogram was performed. Gentle injection revealed persistent occlusion of the  intracranial ICA at the lateral segment. Our choice at this point was to re-attempt a thrombectomy/embolectomy (what would be pass number 2) at the carotid siphon. Combination of the synchro soft catheter, Stryker pro 18 microcatheter, and the zoom 71 intermediate catheter were again advanced through the balloon guide. The microcatheter and the microwire were gently advanced through the occlusion within the carotid siphon, again meeting significant resistance through the segment. Once the microwire was within the proximal M1 segment, the microcatheter was advanced to the same position. The intermediate catheter would not advanced beyond the cavernous segment. Microwire was gently withdrawn from the microcatheter, with the saline drip at the hub. Once the microwire was removed, gentle contrast injection was performed at the microcatheter in the M1 segment position. This gentle injection confirmed that there had been embolization to new territory with a new embolism in the M1 beyond the microcatheter. Microwire was then readvanced through the microcatheter. Microcatheter and microwire were advanced through this new embolism to the M2 segment and the catheter was gently advanced. Microwire was then withdrawn gently with saline drip at the hub. Once the microwire was removed, gentle contrast injection confirmed a luminal position within the M2 segment. For this second pass, 6 mm solitaire device was selected. Back flush was achieved at the rotating hemostatic valve, and then the device was gently advanced through the microcatheter to the distal  end. The retriever was then unsheathed by withdrawing the microcatheter under fluoroscopy. The zoom 71 catheter would not pass beyond the cavernous segment through the ophthalmic segment. Once the retriever was completely unsheathed, the microcatheter was carefully stripped from the delivery device. We then observed a 3 minute interval before withdrawing. The balloon at the  balloon guide catheter was then inflated under fluoroscopy for proximal flow arrest. Constant aspiration using the proprietary engine was then performed at the zoom 71 intermediate catheter, positioned in the cavernous segment, as the retriever was gently and slowly withdrawn with fluoroscopic observation. Once the retriever was felt to be "corked" within the tip of the intermediate catheter, both were removed from the system. Free aspiration was confirmed at the hub of the balloon guide catheter, with free blood return confirmed. The balloon was then deflated, and a control angiogram was performed. 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 At this point we decided to follow through with emergent stenting of the known ICA occlusion at the bifurcation/carotid bulb, in an attempt to improve flow through the distal segment. The withdrawal of the balloon guide catheter to the common carotid artery resulted in some instability of the balloon guide through the innominate artery and the aortic arch. The balloon guide then prolapsed into the aortic arch when we reinserted a wire/catheter combination to address the ICA origin. Balloon guide catheter was then withdrawn into the descending thoracic aorta. 125 cm Berenstein catheter was then advanced through the balloon guide. The catheter was then double flushed in the aorta and then advanced over the aortic arch to engage the innominate artery. Glidewire was then advanced into the common carotid artery and the combination of the coaxial diagnostic catheter and the balloon guide were readvanced into the common carotid artery. The wire was advanced into the external carotid artery and the balloon guide was advanced on the Glidewire. Once the balloon guide was into the proximal external carotid artery, the Glidewire and the diagnostic catheter were withdrawn with constant saline drip at the  hub. The balloon guide was then withdrawn below the bifurcation, and angiogram was performed. We then crossed the proximal ICA with a combination of the Stryker pro view microcatheter and a synchro soft catheter. Once we achieved a distal position in the cervical ICA, synchro soft wire was removed and a transcend wire was placed. We then deployed a 9 mm x 7 mm by 40 mm tapered Abbott exact stent. The delivered ice was withdrawn. We then post dilated with a rapid exchange 5 mm x 40 mm Viatrac balloon. The 5 mm balloon dilation did result in transient acute bradycardia into the 30s, which quickly recovered upon deflation of the balloon and withdrawal. No pharmacologic therapy was required. Balloon was withdrawn, and repeat angiogram confirmed continued occlusion at the stent. Our next strategy was for a third attempt at thrombectomy. We then advanced a coaxial Stryker microcatheter through the zoom 71 catheter, and advanced this combination over the Transcend wire. The balloon catheter was then gently advanced through the carotid stent with close observation under fluoroscopy. There was no perceptible difficulty in advancing through the fresh stent. For this third pass attempt, combination of the synchro soft wire, Stryker pro 18 microcatheter, and the zoom 71 intermediate catheter were again advanced through the balloon guide. The microcatheter and the microwire were gently advanced through the occlusion within the carotid siphon, again meeting significant resistance through the segment. Once the  microwire was within the proximal M1 segment, the microcatheter was advanced to the same position. The intermediate catheter would again not advanced beyond the cavernous segment. Microwire was gently withdrawn from the microcatheter, with the saline drip at the hub. Once the microwire was removed, gentle contrast injection was performed at the microcatheter in the M1 segment position. This gentle injection confirmed  persistent occlusion in the M1 beyond the microcatheter. Microwire was then readvanced through the microcatheter. Microcatheter and microwire were advanced through this new embolism to the M2 segment and the catheter was gently advanced. Microwire was then withdrawn gently with saline drip at the hub. Once the microwire was removed, gentle contrast injection confirmed a luminal position within the M2 segment. For this third pass, we elected to use the 6 mm solitaire device. Back flush was achieved at the rotating hemostatic valve, and then the device was gently advanced through the microcatheter to the distal end. The retriever was then unsheathed by withdrawing the microcatheter under fluoroscopy. The zoom 71 catheter would again not advance beyond the cavernous segment through the ophthalmic segment. Once the retriever was completely unsheathed, the microcatheter was carefully stripped from the delivery device. We observed a 3 minutes time interval. The balloon at the balloon guide catheter was then inflated under fluoroscopy for proximal flow arrest. Constant aspiration using the proprietary engine was then performed at the intermediate catheter, as the retriever was gently and slowly withdrawn with fluoroscopic observation. Once the retriever was "corked" within the tip of the intermediate catheter, both were removed from the system. Free aspiration was confirmed at the hub of the balloon guide catheter, with free blood return confirmed. The balloon was then deflated, and a control angiogram was performed. Persistent occlusion of flow was confirmed at the proximal intracranial ICA. Knowing that we needed to prove patency/occlusion of the proximal M1 ENT, we elected to attempt a fourth pass. For this fourth pass attempt, combination of the synchro soft wire, Stryker pro 18 microcatheter, and the zoom 71 intermediate catheter were again advanced through the balloon guide. The microcatheter and the microwire were  gently advanced through the vertical and horizontal petrous segment, through the segment lacerum and into the cavernous segment. On this fourth pass, the microwire and the microcatheter would not advance through the ophthalmic segment, meeting significant resistance. With repositioning of both the intermediate catheter and the microcatheter, there was no advantage gained with pushing the microwire through the occlusion, either with a straight/tip configuration or a looped configuration. With further attempt, there was concern for potentially creating dissection or perforation, and we elected to withdraw at this point an attempt a left-sided angiogram to observe for cross flow. The microcatheter microwire and intermediate catheter were withdrawn from the balloon guide and the balloon guide was withdrawn from the right ICA and the sheath. We then advanced the JB 1 catheter to the aortic arch on the 035 diagnostic wire. Wire was withdrawn and the catheter was double flushed. JB 1 catheter was used to engage the innominate artery, though would not engage the bovine origin of the left common carotid artery. The JB 1 catheter was removed and we placed a Davis catheter. The Davis catheter was used to engage the innominate artery, and although and angiogram roadmap was performed, the Glidewire would not advanced beyond the tortuous segment of the proximal left common carotid artery. We then required a Sim 2 catheter. Sim 2 catheter recur was formed within the innominate artery, and used to engage the bovine origin  of the common carotid artery. Angiogram was performed for roadmap and a Glidewire was advanced into the common carotid artery. Once the Sim 2 catheter was in the common carotid artery, angiogram was performed. This confirmed that there was cross-flow from the left to the right across patent anterior communicating artery, however, the M1 occlusion persisted. Rosen wire was advanced into the common carotid artery,  to the bifurcation. Sim catheter was removed. The coaxial Berenstein catheter and a new wall wrist balloon guide catheter were advanced on the Rose an wire. As these were advancing into the common carotid artery, the system prolapsed into the aortic arch. The balloon guide and the intermediate catheter were withdrawn from the sheath. The Sim 2 catheter was again advanced to the proximal descending thoracic aorta. Catheter was double flushed. The Sim 2 curve was then reformed within the left subclavian artery. Catheter was withdrawn into the origin of the left common carotid artery. Glidewire was then advanced into the cervical ICA. The Sim catheter was then advanced further on this attempt, for a position in the cervical ICA. Once the Sim catheter was in the mid to distal segment the Glidewire was removed and a rose in wire was placed into the distal cervical ICA. Sim catheter was removed. We then elected to place a more trackable Trak-star access catheter with a coaxial Berenstein catheter. The coaxial system was advanced to the distal cervical segment. Roadmap angiogram was performed. A coaxial Stryker Pro 18 catheter, synchro soft wire, and zoom 55 intermediate catheter were advanced to the cavernous segment. We then made an attempt to place the microcatheter across the patent anterior communicating artery, for final attempt at revascularization of the ENT. The origin of the anterior cerebral artery was at such an angle that catheterization was difficult, and after multiple attempts, in order to avoid any primary injury to the left circulation, we elected to withdraw from the case. All catheters and wires were withdrawn. Distal access catheter was withdrawn from the sheath. Flat panel CT was performed. Patient remained intubated, for medical induced coma and observation, with a target blood pressure of permissive hypertension below 180 systolic Patient tolerated the procedure well. Estimated blood loss 500 cc.  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 Left-sided angiogram demonstrates cross fill via patent anterior communicating artery to the right ACA territory. Angio-Seal for hemostasis Signed, Yvone Neu. Reyne Dumas, RPVI [facility] Radiology PLAN: The patient will remain intubated, for medical induced coma purpose. ICU status Target systolic blood pressure of permissive hypertension, less than 180 systolic Right hip straight time 6 hours Frequent neurovascular checks Repeat neurologic imaging with CT and/MRI at the discretion of neurology team Single antiplatelet agent is reasonable, however, the right ICA stent is occluded, and there is likely no need for dual anti-platelet therapy at this point. Electronically Signed   By: Gilmer Mor D.O.   On: 03/02/2020 13:44   CT HEAD CODE STROKE WO CONTRAST  Result Date: 02/21/2020 CLINICAL DATA:  Code stroke. 79 year old male with left side weakness. EXAM: CT HEAD WITHOUT CONTRAST TECHNIQUE: Contiguous axial images were obtained from the base of the skull through the  vertex without intravenous contrast. COMPARISON:  None. FINDINGS: Brain: No midline shift, mass effect, or evidence of intracranial mass lesion. No acute intracranial hemorrhage identified. No ventriculomegaly. Patchy mild to moderate for age bilateral cerebral white matter hypodensity. No cortically based acute infarct identified. Vascular: Calcified atherosclerosis at the skull base. No  suspicious intracranial vascular hyperdensity. Skull: Degenerative changes in the visible upper cervical spine. No acute osseous abnormality identified. Sinuses/Orbits: Visualized paranasal sinuses and mastoids are stable and well pneumatized. Other: There is some rightward gaze. No other acute orbit finding. Possible right forehead scalp hematoma or contusion on series 4, image 49. Underlying calvarium intact. ASPECTS [facility] Stroke Program Early CT Score) - Ganglionic level infarction (caudate, lentiform nuclei, internal capsule, insula, M1-M3 cortex): - Supraganglionic infarction (M4-M6 cortex): Total score (0-10 with 10 being normal): IMPRESSION: 1. No acute cortically based infarct or acute intracranial hemorrhage identified. ASPECTS 10. Mild to moderate for age cerebral white matter changes. 2. Possible right forehead scalp hematoma or contusion. No underlying skull fracture. 3. These results were communicated to Dr.Khaliqdina at 1:29 pm on 02/08/2020 by text page via the [facility] messaging system. Electronically Signed   By: Odessa Fleming M.D.   On: 02/21/2020 13:29   CT ANGIO HEAD CODE STROKE  Result Date: 03/06/2020 CLINICAL DATA:  Stroke/TIA.  Left-sided weakness. EXAM: CT ANGIOGRAPHY HEAD AND NECK TECHNIQUE: Multidetector CT imaging of the head and neck was performed using the standard protocol during bolus administration of intravenous contrast. Multiplanar CT image reconstructions and MIPs were obtained to evaluate the vascular anatomy. Carotid stenosis measurements (when applicable) are obtained utilizing NASCET criteria, using the distal internal carotid diameter as the denominator. CONTRAST:  75mL OMNIPAQUE IOHEXOL 350 MG/ML SOLN COMPARISON:  Same day CT head. 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 Right carotid system: There is calcific and noncalcific atherosclerosis at the carotid bifurcation. There is occlusion of the cervical  internal carotid ICA at its origin. Non opacification of the remainder of the ICA in the neck. Left carotid system: Calcific atherosclerosis of the common carotid artery. There is calcified and noncalcified atherosclerosis at the bifurcation with a web (see series 6, image 225), but without greater than 50% narrowing. Vertebral arteries: Mildly left dominant. Moderate stenosis of the left vertebral artery origin. Otherwise, no hemodynamically significant stenosis in the neck. 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 Upper chest: No acute abnormality. Review of the MIP images confirms the above findings CTA HEAD FINDINGS Anterior circulation: Occlusion of the right internal carotid artery to the level of the carotid terminus. The right MCA and ACA branches are well opacified proximally. There is moderate stenosis of the right A2 ACA, likely related to atherosclerosis. Posterior circulation: Moderate stenosis of bilateral intradural vertebral arteries secondary to calcific atherosclerosis. Left fetal type PCA. Moderate stenosis of the proximal right P2 PCA with additional severe stenosis of the more distal P2 PCA. Venous sinuses: As permitted by contrast timing, patent. Review of the MIP images confirms the above findings IMPRESSION: 1. 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 The right MCA and ACA branches are opacified. 2. 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 3. Multifocal moderate atherosclerotic stenoses, including the right A2 ACA, proximal right P2 PCA (in addition to severe distal stenosis), bilateral intradural vertebral arteries, and left  vertebral artery origin. 4. 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 5. 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 Conclusion #1 was discussed with Dr. Derry Lory at 1:42 PM via telephone. Electronically Signed   By: Feliberto Harts MD   On: 02/11/2020 14:02   CT ANGIO NECK CODE STROKE  Result Date: 03/03/2020 CLINICAL DATA:  Stroke/TIA.  Left-sided weakness. EXAM: CT ANGIOGRAPHY HEAD AND NECK TECHNIQUE: Multidetector CT imaging of the head and neck was performed using the standard protocol during bolus administration of intravenous contrast. Multiplanar CT image reconstructions and MIPs were obtained to evaluate the vascular anatomy. Carotid stenosis measurements (when applicable) are obtained utilizing NASCET criteria, using the distal internal carotid diameter as the denominator. CONTRAST:  31mL OMNIPAQUE IOHEXOL 350 MG/ML SOLN COMPARISON:  Same day CT head. 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 Right carotid system: There is calcific and noncalcific atherosclerosis at the carotid bifurcation. 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 Non opacification of the remainder of the ICA in the neck. Left carotid system: Calcific atherosclerosis of the common carotid artery. There is calcified and noncalcified atherosclerosis at the bifurcation with a web (see series 6, image 225), but without greater than 50% narrowing. Vertebral arteries: Mildly left dominant. Moderate stenosis of the left vertebral artery origin. Otherwise, no hemodynamically significant stenosis in the neck. 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 Upper chest: No acute abnormality. Review of the MIP images confirms the above findings CTA HEAD FINDINGS Anterior circulation: Occlusion of the right internal carotid artery to the level of the carotid terminus. The right MCA and ACA branches are well opacified proximally. There is moderate stenosis of  the right A2 ACA, likely related to atherosclerosis. Posterior circulation: Moderate stenosis of bilateral intradural vertebral arteries secondary to calcific atherosclerosis. Left fetal type PCA. Moderate stenosis of the proximal right P2 PCA with additional severe stenosis of the more distal P2 PCA. Venous sinuses: As permitted by contrast timing, patent. Review of the MIP images confirms the above findings IMPRESSION: 1. 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 The right MCA and ACA branches are opacified. 2. 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 3. Multifocal moderate atherosclerotic stenoses, including the right A2 ACA, proximal right P2 PCA (in addition to severe distal stenosis), bilateral intradural vertebral arteries, and left vertebral artery origin. 4. 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 5. 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 Conclusion #1 was discussed with Dr. Derry Lory at 1:42 PM via telephone. 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 Intubation was performed in negative pressure [place] in neuro IR holding. Left radial arterial line was performed by the  anesthesia  team. Interventional neuro radiology nursing staff was also present. CONTRAST:  240 cc FLUOROSCOPY TIME:  Fluoroscopy Time: 131 minutes 46 seconds (5866 mGy). 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 New right M1 occlusion, [place] TECHNIQUE: Informed written consent was obtained from the patient's family after a thorough discussion of the procedural risks, benefits and alternatives. Specific risks discussed include: Bleeding, infection, contrast reaction, kidney injury/failure, need for further procedure/surgery, arterial injury or dissection, embolization to new territory, intracranial hemorrhage (10-15% risk), neurologic deterioration, cardiopulmonary collapse, death. All questions were addressed. Maximal Sterile Barrier Technique was utilized including during the procedure including caps, mask, sterile gowns, sterile gloves, sterile drape, hand hygiene and skin antiseptic. A timeout was performed prior to the initiation of the procedure. The anesthesia team was present to provide general endotracheal tube anesthesia and for patient monitoring during the procedure. Interventional neuro radiology nursing staff was also present. FINDINGS: Initial Findings: Type 2 and patulous carotid arch. Innominate artery contributes to origin of the left common carotid artery, in bovine configuration. Right common carotid artery: Normal course caliber and contour. Minimal plaque with no filling defects or irregularity. Right external carotid artery: Patent with antegrade flow. No collateral flow through the middle meningeal pathway, ethmoid pathway, or supraorbital pathway to the right MCA. Right internal carotid artery: The right cervical ICA is occluded at the origin with dense calcified plaque with at least 50% circumferential involvement on the angiogram and the prior CT angiogram. There is absence of flow through the entire right cervical ICA to the skull base Right  MCA: Initial angiogram was inadequate for evaluating the right MCA, given the ICA occlusion Right ACA: Initial angiogram inadequate for evaluating right ACA, given the ICA occlusion. Intraprocedural findings: The initial passage of microcatheter through the ICA occlusion was without difficulty, without significant stenosis perceived at the proximal cervical ICA. The initial and subsequent passages of the microwire and microcatheter through the cavernous segment, ophthalmic segment/supra ophthalmic segment and terminus was quite difficult given the character of the occlusion in the carotid siphon. The first microcatheter position within the proximal M1, just distal to the bifurcation, demonstrates slip-streaming artifact from the cross-filling ACA territory with patency of the lenticulostriate arteries demonstrated. The first deployment of EMBO trap in this initial catheter position at the right terminus segment. Initial 4 mm balloon angioplasty of the right carotid bulb demonstrated no perceivable waist on the 4 mm balloon, which was inflated to 14 atmospheres without a physiologic response. After the initial EMBO trap deployment, balloon angioplasty, and then retrieval of the EMBO trap through the intermediate catheter and balloon guide, there was persistent occlusion of the ICA through the petrous segment class oral segment to the siphon. The next placement of the microcatheter into the proximal M1 segment demonstrated embolization new territory to the M1. The second deployment stent retriever, the 6 mm solitaire was across the M1 segment and into the terminus segment. After withdrawal of the solitary catheter angiogram confirmed no flow through the cervical segment. After placement of 9 mm-7 mm ICA carotid stent, there was persistent occlusion of the cervical ICA. After third deployment of stent retriever, 6 mm solitaire across the M1 segment and terminus segment and withdrawal there is no flow through the  cervical ICA segment. Therefore assessment of the MCA cannot be performed from the right. Of note, with each further navigation of the micro wire and the microcatheter through the carotid siphon there was increasing difficulty. This may be interpreted as either  persisting dense/fibrous embolism and/or dissection and/or chronic stenosis within the carotid siphon secondary to intracranial atherosclerosis. At this point, left cervical/cerebral angiogram was performed, findings below. Final left-sided angiogram demonstrates patency of the anterior communicating artery, with filling of the right ACA territory and leptomeningeal collateral formation towards the MCA watershed, with a persisting right M1 occlusion. Completion Findings: Right MCA: M1 occlusion TICI: TICI 0 Left common carotid artery: Tortuous course of the proximal left common carotid artery, with bovine configuration origin from a type 2 arch. Left external carotid artery: Patent with antegrade flow. Left internal carotid artery: Irregular changes at the left carotid bulb, with a given history of prior carotid endarterectomy. No flow limiting stenosis is present. 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 There is also a focal outpouching on the posterior proximal cervical ICA, without filling defect. Relatively straight course of the left cervical ICA. Vertical and petrous segment patent with normal course caliber contour. Cavernous segment patent. Clinoid segment patent. Antegrade flow of the ophthalmic artery. Ophthalmic segment patent. Terminus patent. Left MCA: M1 segment patent. Insular and opercular segments patent. Unremarkable caliber and course of the cortical segments. Typical arterial, capillary/ parenchymal, and venous phase. Left ACA: A1 origin is lateral to the carotid siphon with a long A1 segment. The anterior communicating  artery is patent, with cross flow into the right-sided hemisphere. The right A1 is normal caliber, with cross fill into the right ACA territory. 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 PROCEDURE: The anesthesia team was present to provide general endotracheal tube anesthesia and for patient monitoring during the procedure. Intubation was performed in negative pressure [place] in neuro IR holding. Interventional neuro radiology nursing staff was also present. Ultrasound survey of the right inguinal region was performed with images stored and sent to PACs. 11 blade scalpel was used to make a small incision. Blunt dissection was performed with US guidance. A micropuncture needle was used access the right common femoral artery under ultrasound. With excellent arterial blood flow returned, an .018 micro wire was passed through the needle, observed to enter the abdominal aorta under fluoroscopy. The needle was removed, and a micropuncture sheath was placed over the wire. The inner dilator and wire were removed, and an 035 wire was advanced under fluoroscopy into the abdominal aorta. The sheath was removed and a 25cm 35F straight vascular sheath was placed. The dilator was removed and the sheath was flushed. Sheath was attached to pressurized and heparinized saline bag for constant forward flow. A coaxial system was then advanced over the 035 wire. This included a 95cm 087 "Walrus" balloon guide with coaxial 125cm Berenstein diagnostic catheter. This was advanced to the proximal descending thoracic aorta. Wire was then removed. Double flush of the catheter was performed. Catheter was then used to select the innominate artery. Angiogram was performed. Using roadmap technique, the catheter was advanced over a standard glide wire into right common carotid artery, using a wire position in the external carotid artery, achieving position of the balloon guide catheter in the common carotid artery.  The diagnostic catheter and the wire were removed. Formal angiogram was performed. Road map function was used once the occluded vessel was identified. Copious back flush was performed and the balloon catheter was attached to heparinized and pressurized saline bag for forward flow. A second coaxial system was then advanced through the  balloon catheter, which included the selected intermediate catheter, microcatheter, and microwire. In this scenario, the set up included a 137cm zoom 71 intermediate catheter, a rapid transit microcatheter, and 014 synchro soft wire. This system was advanced through the balloon guide catheter under the road-map function, with adequate back-flush at the rotating hemostatic valve at that back end of the balloon guide. The microwire was advanced through the occlusion in the proximal cervical ICA, with behavior of the microwire and the medical catheter suggesting a patent ICA with a luminal position. The intermediate catheter tract easily over the microcatheter into the distal cervical ICA. The rapid transit and the microwire were advanced to the cavernous segment of the intracranial ICA. Intermediate catheter remained at the skull base. Microcatheter was then gently advanced with a loop configuration through the carotid sinus, observing the wire to enter the proximal MCA under fluoroscopy. The microcatheter encountered resistance in its course through the carotid siphon and into the proximal right MCA. The wire was removed with saline drip at the hub. Blood was then aspirated through the hub of the microcatheter, and a gentle contrast injection was performed confirming intraluminal position in the proximal M1 segment, just beyond the terminus. Slip streaming artifact confirmed inflow of the contralateral unopacified blood through the patent anterior communicating artery. A rotating hemostatic valve was then attached to the back end of the microcatheter, and a pressurized and heparinized  saline bag was attached to the catheter. 6.66mm x 28mm Embotrap stent retreiver device was then selected. Back flush was achieved at the rotating hemostatic valve, and then the device was gently advanced through the microcatheter to the distal end. The retriever was then unsheathed by withdrawing the microcatheter under fluoroscopy, targeting the most distal aspect terminating within the ICA terminus segment proximal to the bifurcation. Once the retriever was completely unsheathed, the microcatheter was carefully stripped from the delivery device. At this time, with the knowledge of the inflow from the patent anterior communicating artery and the A1 segment, the EMBO trap device was left in place while we addressed the occluded proximal ICA and the presumable permissive lesion at the ICA origin. A second and parallel synchro soft microwire was advanced through the balloon guide catheter using the rapid transit microcatheter. We were able to cross the occlusion easily, with a distal position achieved at the skull base within the cervical ICA. The microwire was removed with saline drip at the hub. Contrast injection confirmed location within the distal cervical ICA. We then placed a soft tip rapid transit wire into the petrous segment and removed the rapid transit catheter. We then proceeded with balloon angioplasty of the proximal ICA using a 4 mm x 40 mm Viatrac rapid exchange system. Patient remained hemodynamically stable with the balloon angioplasty performed. We then gently advanced the balloon guide catheter over the Viatrac balloon at the carotid bulb upon deflation of the balloon, with a distal cervical ICA position achieved with the balloon guide catheter. Rapid transit wire was removed. The zoom 71 catheter was then advanced over the EMBO trap retrieval wire, and advanced to the skull base. The catheter was advanced to the site of occlusion at the cervical ICA. The proprietary engine was turned on, with  stasis of flow at the cavernous segment location. Catheter would not track further over the stent retriever into the terminal ICA segment meeting resistance at the carotid siphon and ophthalmic segment. The working time between deployment of the first EMBO trap and this point was approximately 20-25 minutes.  The balloon at the balloon guide catheter was then inflated under fluoroscopy for proximal flow arrest. Constant aspiration using the proprietary engine was then performed at the zoom 71 intermediate catheter, as the retriever was gently and slowly withdrawn with fluoroscopic observation (representing "pass 1" ). Once the retriever was "corked" within the tip of the intermediate catheter, both were removed from the system. Free aspiration was confirmed at the hub of the balloon guide catheter, with free blood return confirmed. The balloon was then deflated, and a control angiogram was performed. 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 Our choice at this point was to re-attempt a thrombectomy/embolectomy (what would be pass number 2) at the carotid siphon. Combination of the synchro soft catheter, Stryker pro 18 microcatheter, and the zoom 71 intermediate catheter were again advanced through the balloon guide. The microcatheter and the microwire were gently advanced through the occlusion within the carotid siphon, again meeting significant resistance through the segment. Once the microwire was within the proximal M1 segment, the microcatheter was advanced to the same position. The intermediate catheter would not advanced beyond the cavernous segment. Microwire was gently withdrawn from the microcatheter, with the saline drip at the hub. Once the microwire was removed, gentle contrast injection was performed at the microcatheter in the M1 segment position. This gentle injection confirmed that there had been embolization to new territory with a new embolism in  the M1 beyond the microcatheter. Microwire was then readvanced through the microcatheter. Microcatheter and microwire were advanced through this new embolism to the M2 segment and the catheter was gently advanced. Microwire was then withdrawn gently with saline drip at the hub. Once the microwire was removed, gentle contrast injection confirmed a luminal position within the M2 segment. For this second pass, 6 mm solitaire device was selected. Back flush was achieved at the rotating hemostatic valve, and then the device was gently advanced through the microcatheter to the distal end. The retriever was then unsheathed by withdrawing the microcatheter under fluoroscopy. The zoom 71 catheter would not pass beyond the cavernous segment through the ophthalmic segment. Once the retriever was completely unsheathed, the microcatheter was carefully stripped from the delivery device. We then observed a 3 minute interval before withdrawing. The balloon at the balloon guide catheter was then inflated under fluoroscopy for proximal flow arrest. Constant aspiration using the proprietary engine was then performed at the zoom 71 intermediate catheter, positioned in the cavernous segment, as the retriever was gently and slowly withdrawn with fluoroscopic observation. Once the retriever was felt to be "corked" within the tip of the intermediate catheter, both were removed from the system. Free aspiration was confirmed at the hub of the balloon guide catheter, with free blood return confirmed. The balloon was then deflated, and a control angiogram was performed. 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 At this point we decided to follow through with emergent stenting of the known ICA occlusion at the bifurcation/carotid bulb, in an attempt to improve flow through the distal segment. The withdrawal of the balloon guide catheter to the common carotid  artery resulted in some instability of the balloon guide through the innominate artery and the aortic arch. The balloon guide then prolapsed into the aortic arch when we reinserted a wire/catheter combination to address the ICA origin. Balloon guide catheter was then withdrawn into the descending thoracic aorta. 125 cm Berenstein catheter was then advanced through  the balloon guide. The catheter was then double flushed in the aorta and then advanced over the aortic arch to engage the innominate artery. Glidewire was then advanced into the common carotid artery and the combination of the coaxial diagnostic catheter and the balloon guide were readvanced into the common carotid artery. The wire was advanced into the external carotid artery and the balloon guide was advanced on the Glidewire. Once the balloon guide was into the proximal external carotid artery, the Glidewire and the diagnostic catheter were withdrawn with constant saline drip at the hub. The balloon guide was then withdrawn below the bifurcation, and angiogram was performed. We then crossed the proximal ICA with a combination of the Stryker pro view microcatheter and a synchro soft catheter. Once we achieved a distal position in the cervical ICA, synchro soft wire was removed and a transcend wire was placed. We then deployed a 9 mm x 7 mm by 40 mm tapered Abbott exact stent. The delivered ice was withdrawn. We then post dilated with a rapid exchange 5 mm x 40 mm Viatrac balloon. The 5 mm balloon dilation did result in transient acute bradycardia into the 30s, which quickly recovered upon deflation of the balloon and withdrawal. No pharmacologic therapy was required. Balloon was withdrawn, and repeat angiogram confirmed continued occlusion at the stent. Our next strategy was for a third attempt at thrombectomy. We then advanced a coaxial Stryker microcatheter through the zoom 71 catheter, and advanced this combination over the Transcend wire. The balloon  catheter was then gently advanced through the carotid stent with close observation under fluoroscopy. There was no perceptible difficulty in advancing through the fresh stent. For this third pass attempt, combination of the synchro soft wire, Stryker pro 18 microcatheter, and the zoom 71 intermediate catheter were again advanced through the balloon guide. The microcatheter and the microwire were gently advanced through the occlusion within the carotid siphon, again meeting significant resistance through the segment. Once the microwire was within the proximal M1 segment, the microcatheter was advanced to the same position. The intermediate catheter would again not advanced beyond the cavernous segment. Microwire was gently withdrawn from the microcatheter, with the saline drip at the hub. Once the microwire was removed, gentle contrast injection was performed at the microcatheter in the M1 segment position. This gentle injection confirmed persistent occlusion in the M1 beyond the microcatheter. Microwire was then readvanced through the microcatheter. Microcatheter and microwire were advanced through this new embolism to the M2 segment and the catheter was gently advanced. Microwire was then withdrawn gently with saline drip at the hub. Once the microwire was removed, gentle contrast injection confirmed a luminal position within the M2 segment. For this third pass, we elected to use the 6 mm solitaire device. Back flush was achieved at the rotating hemostatic valve, and then the device was gently advanced through the microcatheter to the distal end. The retriever was then unsheathed by withdrawing the microcatheter under fluoroscopy. The zoom 71 catheter would again not advance beyond the cavernous segment through the ophthalmic segment. Once the retriever was completely unsheathed, the microcatheter was carefully stripped from the delivery device. We observed a 3 minutes time interval. The balloon at the balloon  guide catheter was then inflated under fluoroscopy for proximal flow arrest. Constant aspiration using the proprietary engine was then performed at the intermediate catheter, as the retriever was gently and slowly withdrawn with fluoroscopic observation. Once the retriever was "corked" within the tip of the intermediate catheter, both were  removed from the system. Free aspiration was confirmed at the hub of the balloon guide catheter, with free blood return confirmed. The balloon was then deflated, and a control angiogram was performed. Persistent occlusion of flow was confirmed at the proximal intracranial ICA. Knowing that we needed to prove patency/occlusion of the proximal M1 ENT, we elected to attempt a fourth pass. For this fourth pass attempt, combination of the synchro soft wire, Stryker pro 18 microcatheter, and the zoom 71 intermediate catheter were again advanced through the balloon guide. The microcatheter and the microwire were gently advanced through the vertical and horizontal petrous segment, through the segment lacerum and into the cavernous segment. On this fourth pass, the microwire and the microcatheter would not advance through the ophthalmic segment, meeting significant resistance. With repositioning of both the intermediate catheter and the microcatheter, there was no advantage gained with pushing the microwire through the occlusion, either with a straight/tip configuration or a looped configuration. With further attempt, there was concern for potentially creating dissection or perforation, and we elected to withdraw at this point an attempt a left-sided angiogram to observe for cross flow. The microcatheter microwire and intermediate catheter were withdrawn from the balloon guide and the balloon guide was withdrawn from the right ICA and the sheath. We then advanced the JB 1 catheter to the aortic arch on the 035 diagnostic wire. Wire was withdrawn and the catheter was double flushed. JB 1  catheter was used to engage the innominate artery, though would not engage the bovine origin of the left common carotid artery. The JB 1 catheter was removed and we placed a Davis catheter. The Davis catheter was used to engage the innominate artery, and although and angiogram roadmap was performed, the Glidewire would not advanced beyond the tortuous segment of the proximal left common carotid artery. We then required a Sim 2 catheter. Sim 2 catheter recur was formed within the innominate artery, and used to engage the bovine origin of the common carotid artery. Angiogram was performed for roadmap and a Glidewire was advanced into the common carotid artery. Once the Sim 2 catheter was in the common carotid artery, angiogram was performed. This confirmed that there was cross-flow from the left to the right across patent anterior communicating artery, however, the M1 occlusion persisted. Rosen wire was advanced into the common carotid artery, to the bifurcation. Sim catheter was removed. The coaxial Berenstein catheter and a new wall wrist balloon guide catheter were advanced on the Rose an wire. As these were advancing into the common carotid artery, the system prolapsed into the aortic arch. The balloon guide and the intermediate catheter were withdrawn from the sheath. The Sim 2 catheter was again advanced to the proximal descending thoracic aorta. Catheter was double flushed. The Sim 2 curve was then reformed within the left subclavian artery. Catheter was withdrawn into the origin of the left common carotid artery. Glidewire was then advanced into the cervical ICA. The Sim catheter was then advanced further on this attempt, for a position in the cervical ICA. Once the Sim catheter was in the mid to distal segment the Glidewire was removed and a rose in wire was placed into the distal cervical ICA. Sim catheter was removed. We then elected to place a more trackable Trak-star access catheter with a coaxial  Berenstein catheter. The coaxial system was advanced to the distal cervical segment. Roadmap angiogram was performed. A coaxial Stryker Pro 18 catheter, synchro soft wire, and zoom 55 intermediate catheter were advanced  to the cavernous segment. We then made an attempt to place the microcatheter across the patent anterior communicating artery, for final attempt at revascularization of the ENT. The origin of the anterior cerebral artery was at such an angle that catheterization was difficult, and after multiple attempts, in order to avoid any primary injury to the left circulation, we elected to withdraw from the case. All catheters and wires were withdrawn. Distal access catheter was withdrawn from the sheath. Flat panel CT was performed. Patient remained intubated, for medical induced coma and observation, with a target blood pressure of permissive hypertension below 180 systolic Patient tolerated the procedure well. Estimated blood loss 500 cc. 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 Left-sided angiogram demonstrates cross fill via patent anterior communicating artery to the right ACA territory. Angio-Seal for hemostasis Signed, Yvone Neu. Reyne Dumas, RPVI [facility] Radiology PLAN: The patient will remain intubated, for medical induced coma purpose. ICU status Target systolic blood pressure of permissive hypertension, less than 180 systolic Right hip straight time 6 hours Frequent  neurovascular checks Repeat neurologic imaging with CT and/MRI at the discretion of neurology team Single antiplatelet agent is reasonable, however, the right ICA stent is occluded, and there is likely no need for dual anti-platelet therapy at this point. Electronically Signed   By: Gilmer Mor D.O.   On: 03/02/2020 13:44    Labs:  CBC: Recent Labs    2020/03/17 2007 03/17/20 2007 Mar 17, 2020 2118 03/01/20 0500 03/02/20 0432 03/03/20 0613  WBC 6.1  --   --  7.9 9.6 11.0*  HGB 13.1   < > 12.9* 12.6* 12.8* 13.7  HCT 39.3   < > 38.0* 38.3* 38.4* 41.9  PLT 125*  --   --  128* 126* 127*   < > = values in this interval not displayed.    COAGS: Recent Labs    March 17, 2020 1317 03/01/20 0500  INR 1.4* 1.5*  APTT 30  --     BMP: Recent Labs    2020/03/17 1317 March 17, 2020 1321 Mar 17, 2020 1832 2020-03-17 1832 2020/03/17 2118 2020-03-17 2118 03/01/20 0500 03/01/20 1632 03/02/20 0432 03/02/20 1037 03/02/20 1642 03/02/20 2303 03/03/20 0613 03/03/20 1037  NA 140   < > 147*   < > 139   < > 139   < > 145   < > 149* 150* 148* 150*  K 3.7   < > 2.4*   < > 4.4  --  3.7  --  3.3*  --   --   --  3.0*  --   CL 103   < > 114*  --   --   --  109  --  112*  --   --   --  114*  --   CO2 29  --   --   --   --   --  20*  --  20*  --   --   --  22  --   GLUCOSE 176*   < > 90  --   --   --  166*  --  132*  --   --   --  293*  --   BUN 26*   < > 15  --   --   --  26*  --  21  --   --   --  18  --   CALCIUM 9.3  --   --   --   --   --  8.5*  --  8.7*  --   --   --  9.1  --   CREATININE 1.06   < > 0.30*  --   --   --  1.03  --  0.90  --   --   --  0.93  --   GFRNONAA >60  --   --   --   --   --  >60  --  >60  --   --   --  >60  --    < > = values in this interval not displayed.    LIVER FUNCTION TESTS: Recent Labs    02/19/2020 1317 03/01/20 0500  BILITOT 1.3* 1.4*  AST 21 17  ALT 23 19  ALKPHOS 82 67  PROT 6.3* 5.1*  ALBUMIN 3.4* 3.1*    Assessment and Plan: PTA and stenting of the right ICA,  33mm-7mm Xact stent, presuming a permissive lesion of the bifurcation Attempted thrombectomy of the intracranial ICA below terminus and of the right MCA/M1 (embolism new territory), unsuccesful Patient s/p unsuccessful attempt to revascularize intermittently symptomatic right ICA occlusion.  Now with R MCA infarct.  Successfully extubated yesterday.  Cognizant of presence and follows commands with the right side.  Does not open his eyes or attempt to vocalize.   MR overnight showed: 1. Acute infarction affecting the right middle cerebral artery territory with sparing of the putamen, globus pallidus and caudate head. Region of cortical and subcortical infarction measures up to 11.5 cm. Minimal petechial blood products in the region of the infarction. No measurable hematoma. No midline shift. 2. Abnormal flow pattern in the right ICA at the base of the brain. There appears to be partial thrombosis or dissection.  Plans per Neuro and CCM.  IR available if needed.   Electronically Signed: Hoyt Koch, PA 03/03/2020, 2:41 PM   I spent a total of 15 Minutes at the the patient's bedside AND on the patient's hospital floor or unit, greater than 50% of which was counseling/coordinating care for R ICA occlusion, R MCA infarct

## 2020-03-04 ENCOUNTER — Inpatient Hospital Stay (HOSPITAL_COMMUNITY): Payer: Medicare Other

## 2020-03-04 DIAGNOSIS — I63511 Cerebral infarction due to unspecified occlusion or stenosis of right middle cerebral artery: Secondary | ICD-10-CM | POA: Diagnosis not present

## 2020-03-04 DIAGNOSIS — I63429 Cerebral infarction due to embolism of unspecified anterior cerebral artery: Secondary | ICD-10-CM | POA: Diagnosis not present

## 2020-03-04 DIAGNOSIS — R1312 Dysphagia, oropharyngeal phase: Secondary | ICD-10-CM | POA: Diagnosis not present

## 2020-03-04 DIAGNOSIS — R509 Fever, unspecified: Secondary | ICD-10-CM | POA: Diagnosis not present

## 2020-03-04 LAB — CBC
HCT: 41.6 % (ref 39.0–52.0)
Hemoglobin: 13.5 g/dL (ref 13.0–17.0)
MCH: 32.1 pg (ref 26.0–34.0)
MCHC: 32.5 g/dL (ref 30.0–36.0)
MCV: 99 fL (ref 80.0–100.0)
Platelets: 126 10*3/uL — ABNORMAL LOW (ref 150–400)
RBC: 4.2 MIL/uL — ABNORMAL LOW (ref 4.22–5.81)
RDW: 14.6 % (ref 11.5–15.5)
WBC: 11.9 10*3/uL — ABNORMAL HIGH (ref 4.0–10.5)
nRBC: 0 % (ref 0.0–0.2)

## 2020-03-04 LAB — MAGNESIUM
Magnesium: 1.7 mg/dL (ref 1.7–2.4)
Magnesium: 1.9 mg/dL (ref 1.7–2.4)

## 2020-03-04 LAB — GLUCOSE, CAPILLARY
Glucose-Capillary: 206 mg/dL — ABNORMAL HIGH (ref 70–99)
Glucose-Capillary: 160 mg/dL — ABNORMAL HIGH (ref 70–99)
Glucose-Capillary: 188 mg/dL — ABNORMAL HIGH (ref 70–99)
Glucose-Capillary: 132 mg/dL — ABNORMAL HIGH (ref 70–99)
Glucose-Capillary: 152 mg/dL — ABNORMAL HIGH (ref 70–99)
Glucose-Capillary: 182 mg/dL — ABNORMAL HIGH (ref 70–99)

## 2020-03-04 LAB — BASIC METABOLIC PANEL
Anion gap: 10 (ref 5–15)
BUN: 26 mg/dL — ABNORMAL HIGH (ref 8–23)
CO2: 28 mmol/L (ref 22–32)
Calcium: 8.9 mg/dL (ref 8.9–10.3)
Chloride: 115 mmol/L — ABNORMAL HIGH (ref 98–111)
Creatinine, Ser: 1.09 mg/dL (ref 0.61–1.24)
GFR, Estimated: >60 mL/min (ref >60)
Glucose, Bld: 223 mg/dL — ABNORMAL HIGH (ref 70–99)
Potassium: 3.6 mmol/L (ref 3.5–5.1)
Sodium: 153 mmol/L — ABNORMAL HIGH (ref 135–145)

## 2020-03-04 LAB — PHOSPHORUS: Phosphorus: 2.4 mg/dL — ABNORMAL LOW (ref 2.5–4.6)

## 2020-03-04 LAB — SODIUM: Sodium: 155 mmol/L — ABNORMAL HIGH (ref 135–145)

## 2020-03-04 LAB — TRIGLYCERIDES: Triglycerides: 85 mg/dL (ref <150)

## 2020-03-04 MED ORDER — K PHOS MONO-SOD PHOS DI & MONO 155-852-130 MG PO TABS
500.0000 mg | ORAL_TABLET | Freq: Once | ORAL | Status: AC
  Administered 2020-03-04: 500 mg
  Filled 2020-03-04: qty 2

## 2020-03-04 MED ORDER — SENNOSIDES-DOCUSATE SODIUM 8.6-50 MG PO TABS: 1.0000 | ORAL_TABLET | Freq: Every evening | ORAL | Status: DC | PRN

## 2020-03-04 MED ORDER — SODIUM CHLORIDE 3 % IN NEBU: 4.0000 mL | INHALATION_SOLUTION | Freq: Four times a day (QID) | RESPIRATORY_TRACT | Status: DC

## 2020-03-04 MED ORDER — CARVEDILOL 12.5 MG PO TABS
25.0000 mg | ORAL_TABLET | Freq: Two times a day (BID) | ORAL | Status: DC
  Administered 2020-03-04 – 2020-03-08 (×10): 25 mg
  Filled 2020-03-04 (×11): qty 2

## 2020-03-04 MED ORDER — SODIUM CHLORIDE 3 % IN NEBU
4.0000 mL | INHALATION_SOLUTION | Freq: Four times a day (QID) | RESPIRATORY_TRACT | Status: DC
  Administered 2020-03-04 – 2020-03-05 (×2): 4 mL via RESPIRATORY_TRACT
  Filled 2020-03-04 (×2): qty 4

## 2020-03-04 NOTE — Progress Notes (Signed)
STROKE TEAM PROGRESS NOTE   INTERVAL HISTORY RN at bedside.  Patient lying in bed, lethargic, however still follow commands on the right.  Still has copious secretions with intermittent coughing.  CCM on board, continue watch, intubation if needed.  Vitals:   03/04/20 1100 03/04/20 1200 03/04/20 1300 03/04/20 1400  BP: 108/80 (!) 151/83 133/89 (!) 153/87  Pulse: 94 91 (!) 106 100  Resp: (!) 25 (!) 26 (!) 26 (!) 24  Temp:      TempSrc:      SpO2: 100% 100% 93% 100%  Weight:      Height:       CBC:  Recent Labs  Lab 02/13/2020 1317 02/11/2020 1321 03/03/20 0613 03/04/20 1040  WBC 6.3   < > 11.0* 11.9*  NEUTROABS 4.2  --   --   --   HGB 13.6   < > 13.7 13.5  HCT 42.1   < > 41.9 41.6  MCV 100.5*   < > 98.4 99.0  PLT 161   < > 127* 126*   < > = values in this interval not displayed.   Basic Metabolic Panel:  Recent Labs  Lab 03/03/20 0613 03/03/20 1037 03/03/20 1651 03/03/20 1651 03/03/20 2205 03/04/20 1040  NA 148*   < > 153*   < > 155* 153*  K 3.0*  --   --   --   --  3.6  CL 114*  --   --   --   --  115*  CO2 22  --   --   --   --  28  GLUCOSE 293*  --   --   --   --  223*  BUN 18  --   --   --   --  26*  CREATININE 0.93  --   --   --   --  1.09  CALCIUM 9.1  --   --   --   --  8.9  MG 2.1   < > 2.0  --   --  1.7  PHOS 1.8*   < > 1.8*  --   --  2.4*   < > = values in this interval not displayed.   Lipid Panel:  Recent Labs  Lab 03/02/20 0432 03/03/20 0613 03/04/20 1040  CHOL 96  --   --   TRIG 132   < > 85  HDL 28*  --   --   CHOLHDL 3.4  --   --   VLDL 26  --   --   LDLCALC 42  --   --    < > = values in this interval not displayed.   HgbA1c:  Recent Labs  Lab 03/01/20 0500  HGBA1C 6.3*   Urine Drug Screen: No results for input(s): LABOPIA, COCAINSCRNUR, LABBENZ, AMPHETMU, THCU, LABBARB in the last 168 hours.  Alcohol Level No results for input(s): ETH in the last 168 hours.  IMAGING past 24 hours  CT HEAD WO CONTRAST  Result Date:  03/04/2020 CLINICAL DATA:  Follow-up examination for acute stroke. EXAM: CT HEAD WITHOUT CONTRAST TECHNIQUE: Contiguous axial images were obtained from the base of the skull through the vertex without intravenous contrast. COMPARISON:  Prior CT from 03/02/2020. FINDINGS: Brain: Continued interval evolution of cytotoxic edema involving a large portion of the right frontal and temporal regions, consistent with evolving subacute right MCA territory infarct. Overall, size and distribution is not significantly changed from previous. Involvement of the right basal  ganglia again noted. Localized edema with partial effacement of the right lateral ventricle has slightly worsened from previous. No more than trace right-to-left midline shift. No hydrocephalus or trapping. Basilar cisterns remain widely patent. No evidence for hemorrhagic transformation. Remainder the brain is otherwise stable in appearance. No other new large vessel territory infarct. Underlying atrophy with chronic microvascular ischemic disease again noted. No extra-axial fluid collection. Vascular: No new hyperdense vessel. Calcified atherosclerosis present at skull base. Skull: Scalp soft tissues and calvarium are unchanged and remain within normal limits. Sinuses/Orbits: Globes and orbital soft tissues demonstrate no acute finding. Left sphenoid sinus disease noted. Nasogastric tube in place in the left nasal cavity. Mastoid air cells remain clear. Other: None. IMPRESSION: 1. Continued interval evolution of subacute right MCA territory infarct, stable in size and distribution from previous. Slightly worsened localized edema and partial effacement of the right lateral ventricle, but with no more than trace right-to-left midline shift. No hydrocephalus or trapping. No evidence for hemorrhagic transformation. 2. No other new acute intracranial abnormality. Electronically Signed   By: Rise MuBenjamin  McClintock M.D.   On: 03/04/2020 05:31   CT HEAD WO  CONTRAST 03/02/2020 IMPRESSION: 1. 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 No hemorrhagic transformation or other complication. Localized edemawithout midline shift. 2. No other new acute intracranial abnormality.   PHYSICAL EXAM   Temp:  [98 F (36.7 C)-99.5 F (37.5 C)] 98 F (36.7 C) (11/28 1053) Pulse Rate:  [56-108] 100 (11/28 1400) Resp:  [24-41] 24 (11/28 1400) BP: (108-184)/(72-143) 153/87 (11/28 1400) SpO2:  [93 %-100 %] 100 % (11/28 1400)  General - Well nourished, well developed, lethargic, eyes closed, intermittent cough with oral secreation.  Ophthalmologic - fundi not visualized due to noncooperation.  Cardiovascular - irregularly irregular heart rate and rhythm.  Neuro - Eyes are closed, lethargic, attempted to open on voice or commands, however, he was able to follow simple commands on the right and midline. Nonverbal though. With forced eye opening, pt not blinking to visual threat bilaterally, right gaze preference, not able to cross midline. Able to track on the right with eye movement to the right. PERRL. Mild left facial weakness.  Tongue protrusion not cooperative.  Motor system exam shows spontaneous purposeful right upper extremity movements barely against gravity, RLE withdraw 3-/5 to pain and moving toes PF/DF on request.  Left upper and lower extremity hypotonic and slight withdraw respond to painful stimulus. No babinski bilaterally.  Gait not tested   ASSESSMENT/PLAN Richard Farmer is a 79 y.o. male with history of AF on warfarin, know ICA stenosis presenting with L sided weakness and hemianopsia. He did not receive tPA.  Taken to IR for emergent stenting of R ICA occlusion.  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 Large vessel  disease  CT head no acute abnormality. Possible R forehead scalp hematoma/contusion  CTA head & neck R cervical ICA origin occlusion with reconstitution at terminus. Critical stenosis brachiocephalic artery. Severe stenosis R P2. Multifocal moderate R A2, proximal R P2, severe distal R P2, B mid VA, L VA origin stenoses. B ICA atherosclerosis w/ L ICA web. Severe degenerative C6-7 disc dz.  CT perfusion large penumbra  Cerebral angio - R ICA occlusion from bulb to proximal ICA terminus. Stent R ICA. ICA remains  occluded after ICA stent, secondary to occlusion at the supraclinoid segment. Right M1 remains occluded, embolization to new territory.  MRI  R MCA infarct w/ sparing of putamen, globus pallidus and caudate head. Petechial hemorrhage. No shift. Abnormal flor R ICA w/ partial dissection.  CT Head 11/28 - Continued interval evolution of subacute right MCA territory infarct, stable in size and distribution from previous. Slightly worsened localized edema and partial effacement of the right lateral ventricle, but with no more than trace right-to-left midline shift.  2D Echo EF 60-65%. No source of embolus. LA moderately dilated  LDL 42     HgbA1c 6.3  VTE prophylaxis - Lovenox 40 mg sq daily   aspirin 81 mg daily and warfarin daily prior to admission w/ INR 1.4 on arrival, now on aspirin 81 mg daily and clopidogrel 75 mg daily.   Therapy recommendations:  CIR vs SNF  Disposition:  pending   Acute Respiratory Failure  Secondary to stroke  Intubated for IR, extubated 03/02/2020  Continue to have copious secretions  Close monitoring respiratory status  CCM on board   Cytotoxic cerebral edema  MRI large right MCA infarct with cytotoxic edema and effacement of right lateral ventricle  On 3% at 75cc -> 50cc / hr->off  Goal Na 145-155  Na 145->146->149->150->148->153->155->153  CT 11/28 - Continued interval evolution of subacute right MCA territory infarct, stable in  size and distribution from previous. Slightly worsened localized edema and partial effacement of the right lateral ventricle, but with no more than trace right-to-left midline shift.  Chronic Atrial Fibrillation on Coumadin with subtherapeutic INR  Home anticoagulation:  warfarin daily + asa 81  Admission INR 1.4 -> 1.5  On DAPT now  Not an AC candidate at this time d/t large stroke sites and risk of hemorrhagic transformation    Consider resume anticoagulation in 10 to 14 days  Aspiration pneumonia  Fever T-max 100.6->100.1->afebrile  Extubated 11/26 but still have copious secretions  CXR small left pleural effusion and infiltration or atelectasis in the left base  On Unasyn  CCM on board  NT suctioning as needed  On 3% neb and Chest PT  Hypertension  Home meds:  Coreg 12.5 bid, losartan 50, Maxzide . BP stable . Resume Coreg 12.5->25 bid  . Resume losartan 50->100 . Long-term BP goal normotensive  Hyperlipidemia  Home meds:  lipitor 80 + omega 3  LDL 42, goal < 70  On Lipitor 40  Continue statin on discharge  Diabetes type II Controlled  Home meds - amaryl  HgbA1c 6.3, goal < 7.0  CBGs  SSI  On Lantus  PT follow-up  Dysphagia . Secondary to stroke . NPO . Speech on board . Cortrak placed 11/26 - on TF   Tobacco abuse  Current smoker  Smoking cessation counseling will be provided  Pt is willing to quit  Other Stroke Risk Factors  Advanced Age >/= 65   ETOH use 4x wk  Obesity, Body mass index is 30.96 kg/m., BMI >/= 30 associated with increased stroke risk, recommend weight loss, diet and exercise as appropriate   Family hx stroke (father)  Coronary artery disease s/p stent  Other Active Problems  Gout on allopurinal 300 daily  GERD on prilosec  BPH on flomax  Hypokalemia K 2.4->4.4->3.7->3.3->3.0 - supplemented - 3.6  Mild thrombocytopenia - platelets - 161->127->126   Hospital day # 4  This patient is critically  ill due to cerebral edema, aspiration pneumonia, dysphagia, A. fib, respiratory failure, large  right MCA stroke and at significant risk of neurological worsening, death form brain herniation, recurrent stroke, sepsis, seizure, heart failure. This patient's care requires constant monitoring of vital signs, hemodynamics, respiratory and cardiac monitoring, review of multiple databases, neurological assessment, discussion with family, other specialists and medical decision making of high complexity. I spent 35 minutes of neurocritical care time in the care of this patient.  I discussed with Dr. Merrily Pew CCM  Marvel Plan, MD PhD Stroke Neurology 03/04/2020 5:34 PM   To contact Stroke Continuity provider, please refer to [email or web address]. After hours, contact General Neurology

## 2020-03-04 NOTE — Progress Notes (Signed)
NAME:  Richard Farmer, MRN:  [id number], DOB:  01/21/1941, LOS: 4 ADMISSION DATE:  03-19-2020, CONSULTATION DATE:  03/04/20 REFERRING MD:  Loreta Ave, CHIEF COMPLAINT:   L-sided weakness, CVA  Brief History   79 y.o. M of atrial fibrillation on coumadin, CAD, carotid stenosis, DM, HL and HTN who presented with L-sided weakness and found to have right ICA cervical occlusion s/p stenting right ICA and unsuccessful thrombectomy of right MCA/M1 and intracranial ICA.  Left intubated post procedure   Past Medical History   has a past medical history of Atrial fibrillation (HCC), CAD (coronary artery disease), Carotid artery disease (HCC), Diabetes mellitus (HCC), Gout, Hyperlipidemia, and Hypertension.   Significant Hospital Events   11/24 Admit to Neurology  Consults:  PCCM  Procedures:  11/24 ETT >> 11/24 left radial aline >>  Significant Diagnostic Tests:  11/24 CTA head>> Age indeterminate occlusion of the right cervical ICA at its origin with reconstitution at the carotid terminus. The right MCA and ACA branches are opacified. 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  Multifocal moderate atherosclerotic stenoses, including the right A2 ACA, proximal right P2 PCA (in addition to severe distal stenosis), bilateral intradural vertebral arteries, and left vertebral artery origin.  11/24 CXR>> no acute infiltrate, ETT in good position  11/25 CT head : Acute right MCA territory infarct. Mild cerebral atrophy. Mild pansinus disease. Trace layering secretions may reflect. active inflammation.  11/25 MRI brain >> 1. Acute infarction affecting the right middle cerebral artery territory with sparing of the putamen, globus pallidus and caudate head. Region of cortical and subcortical infarction measures up to 11.5 cm. Minimal petechial blood products in the region of the infarction. No measurable hematoma. No midline shift. 2. Abnormal flow pattern in the  right ICA at the base of the brain. There appears to be partial thrombosis or dissection.  11/25 TTE >> 1. Left ventricular ejection fraction, by estimation, is 60 to 65%. The left ventricle has normal function. The left ventricle has no regional wall motion abnormalities. There is mild left ventricular hypertrophy. Left ventricular diastolic parameters  are indeterminate.  2. Right ventricular systolic function is normal. The right ventricular size is mildly enlarged. There is mildly elevated pulmonary artery systolic pressure. The estimated right ventricular systolic pressure is 37.4 mmHg.  3. Left atrial size was moderately dilated.  4. Right atrial size was moderately dilated.  5. The mitral valve is normal in structure. Trivial mitral valve regurgitation. No evidence of mitral stenosis.  6. Tricuspid valve regurgitation is moderate.  7. The aortic valve is calcified. There is moderate calcification of the aortic valve. There is moderate thickening of the aortic valve. Aortic valve regurgitation is not visualized. Mild aortic valve stenosis. Aortic valve mean gradient measures 15.6  mmHg. Aortic valve Vmax measures 2.56 m/s.  8. The inferior vena cava is dilated in size with >50% respiratory variability, suggesting right atrial pressure of 8 mmHg.   11/26 CTH >> 1. 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 No hemorrhagic transformation or other complication. Localized edema without midline shift. 2. No other new acute intracranial abnormality.   Micro Data:  11/24 Covid-19 and Influenza>>negative 11/24 MRSA  >> neg  Antimicrobials:  n/a  Interim history/subjective:  Patient remains lethargic, he has weak cough and dysarthric speech.  Continue to have left-sided weakness  Objective   Blood pressure (!) 139/96, pulse 95, temperature 98 F (  36.7 C), temperature source Oral, resp. rate (!) 31, height 5\' 6"   (1.676 m), weight 87 kg, SpO2 100 %.        Intake/Output Summary (Last 24 hours) at 03/04/2020 1101 Last data filed at 03/04/2020 1000 Gross per 24 hour  Intake 2144.83 ml  Output 4950 ml  Net -2805.17 ml   Filed Weights   03/05/2020 1300 03/02/2020 1403 03/03/20 0500  Weight: 87 kg 87 kg 87 kg   General:  Elderly Caucasian male, lying on the bed HEENT: Atraumatic, normocephalic dry mucous membranes Neuro: Eyes closed, raises eyebrows but unable to open eyes-has gaze deviation to the right RUE 4/5, RLE 3/5, flicker of movement in LLE, flaccid in LUE/ at times extensor posturing CV: Irregularly irregular, no murmur PULM: Clear to auscultation bilaterally GI: soft, NT/ND, bs+ Extremities: warm/dry, no LE edema  Skin: no rashes  Resolved Hospital Problem list   Acute hypoxic respiratory failure due to stroke  Assessment & Plan:  Acute right MCA territory stroke, due to right ICA/right M1 occlusion status post attempted thrombectomy and stent placement in the right ICA Cerebral edema  Dysphagia due to acute stroke Induced hypernatremia CT head was repeated this morning, showing continued evolving right MCA stroke with slight increase in edema and 1 mm midline shift Continue hypertonic saline at 75 ml/hr- via PIV.  Serum sodium is at goal of 155-160 Continue to monitor serum sodium every 6 hours Continue neuro watch every hour Continue aspirin and Plavix Continue secondary stroke prophylaxis Continue tube feeds via Cortrak   Possible aspiration pneumonia Patient's x-ray chest suggest left lower lobe infiltrates, bedside POCUS confirmed presence of infiltrate in left lower lobe Patient remained afebrile Continue Unasyn Respiratory culture is pending  Chronic atrial fibrillation Rate controlled Continue Coreg Continue to hold home coumadin for now  Hypokalemia/hypomagnesemia/hypophosphatemia Aggressively supplement electrolytes, and monitor  Diabetes type 2 Monitor  fingerstick with goal 140-180 Continue sliding scale insulin  Best practice (evaluated daily)   Diet: NPO, continue tube feeds pain/Anxiety/Delirium protocol (if indicated): N/A VAP protocol (if indicated): HOB 30 degrees, suction as needed DVT prophylaxis: Subcu Lovenox GI prophylaxis: Protonix Glucose control: SSI Mobility: Bedrest Code Status: Full code Family communication: Patient's son and wife was updated over the phone Disposition: ICU  Labs   CBC: Recent Labs  Lab 02/12/2020 1317 02/18/2020 1321 02/18/2020 2007 02/12/2020 2118 03/01/20 0500 03/02/20 0432 03/03/20 0613  WBC 6.3  --  6.1  --  7.9 9.6 11.0*  NEUTROABS 4.2  --   --   --   --   --   --   HGB 13.6   < > 13.1 12.9* 12.6* 12.8* 13.7  HCT 42.1   < > 39.3 38.0* 38.3* 38.4* 41.9  MCV 100.5*  --  95.2  --  96.2 98.0 98.4  PLT 161  --  125*  --  128* 126* 127*   < > = values in this interval not displayed.    Basic Metabolic Panel: Recent Labs  Lab 02/28/2020 1317 02/06/2020 1317 03/01/2020 1321 02/19/2020 1321 02/06/2020 1832 02/20/2020 1832 03/05/2020 2118 02/07/2020 2118 03/01/20 0500 03/01/20 1632 03/02/20 0432 03/02/20 0432 03/02/20 1037 03/02/20 1037 03/02/20 1642 03/02/20 1642 03/02/20 2303 03/03/20 0613 03/03/20 1037 03/03/20 1651 03/03/20 2205  NA 140   < > 140   < > 147*   < > 139   < > 139   < > 145   < > 146*   < > 149*   < >  150* 148* 150* 153* 155*  K 3.7   < > 3.6   < > 2.4*  --  4.4  --  3.7  --  3.3*  --   --   --   --   --   --  3.0*  --   --   --   CL 103   < > 102  --  114*  --   --   --  109  --  112*  --   --   --   --   --   --  114*  --   --   --   CO2 29  --   --   --   --   --   --   --  20*  --  20*  --   --   --   --   --   --  22  --   --   --   GLUCOSE 176*   < > 163*  --  90  --   --   --  166*  --  132*  --   --   --   --   --   --  293*  --   --   --   BUN 26*   < > 29*  --  15  --   --   --  26*  --  21  --   --   --   --   --   --  18  --   --   --   CREATININE 1.06   < > 1.00   --  0.30*  --   --   --  1.03  --  0.90  --   --   --   --   --   --  0.93  --   --   --   CALCIUM 9.3  --   --   --   --   --   --   --  8.5*  --  8.7*  --   --   --   --   --   --  9.1  --   --   --   MG  --   --   --   --   --   --   --   --  1.6*  --   --   --  2.1  --   --   --   --  2.1  --  2.0  --   PHOS  --   --   --   --   --   --   --   --   --   --   --   --   --   --  1.8*  --   --  1.8*  --  1.8*  --    < > = values in this interval not displayed.   GFR: Estimated Creatinine Clearance: 65.5 mL/min (by C-G formula based on SCr of 0.93 mg/dL). Recent Labs  Lab 03/03/2020 2007 03/01/20 0500 03/02/20 0432 03/03/20 0613  WBC 6.1 7.9 9.6 11.0*    Liver Function Tests: Recent Labs  Lab 02/25/2020 1317 03/01/20 0500  AST 21 17  ALT 23 19  ALKPHOS 82 67  BILITOT 1.3* 1.4*  PROT 6.3* 5.1*  ALBUMIN 3.4* 3.1*   No results for input(s): LIPASE, AMYLASE in the last  168 hours. No results for input(s): AMMONIA in the last 168 hours.  ABG    Component Value Date/Time   PHART 7.412 09-16-2019 2118   PCO2ART 37.5 09-16-2019 2118   PO2ART 195 (H) 09-16-2019 2118   HCO3 23.8 09-16-2019 2118   TCO2 25 09-16-2019 2118   ACIDBASEDEF 1.0 09-16-2019 2118   O2SAT 100.0 09-16-2019 2118     Coagulation Profile: Recent Labs  Lab 2019-10-08 1317 03/01/20 0500  INR 1.4* 1.5*    Cardiac Enzymes: No results for input(s): CKTOTAL, CKMB, CKMBINDEX, TROPONINI in the last 168 hours.  HbA1C: Hgb A1c MFr Bld  Date/Time Value Ref Range Status  03/01/2020 05:00 AM 6.3 (H) 4.8 - 5.6 % Final    Comment:    (NOTE) Pre diabetes:          5.7%-6.4%  Diabetes:              >6.4%  Glycemic control for   <7.0% adults with diabetes     CBG: Recent Labs  Lab 03/03/20 1915 03/03/20 2305 03/04/20 0312 03/04/20 0722 03/04/20 1054  GLUCAP 223* 193* 206* 160* 188*    Review of Systems:   Unable to obtain as patient is intubated  Past Medical History  He,  has a past medical  history of Atrial fibrillation (HCC), CAD (coronary artery disease), Carotid artery disease (HCC), Diabetes mellitus (HCC), Gout, Hyperlipidemia, and Hypertension.   Surgical History    Past Surgical History:  Procedure Laterality Date  . CORONARY STENT PLACEMENT    . IR ANGIO INTRA EXTRACRAN SEL INTERNAL CAROTID UNI L MOD SED  09-16-2019  . IR CT HEAD LTD  09-16-2019  . IR PERCUTANEOUS ART THROMBECTOMY/INFUSION INTRACRANIAL INC DIAG ANGIO  09-16-2019  . IR US GUIDE VASC ACCESS RIGHT  09-16-2019  . PROSTATE SURGERY    . RADIOLOGY WITH ANESTHESIA N/A 09-16-2019   Procedure: IR WITH ANESTHESIA - CODE STROKE;  Surgeon: Radiologist, Medication, MD;  Location: [facility] OR;  Service: Radiology;  Laterality: N/A;  . REPLACEMENT TOTAL KNEE Right   . SPINE SURGERY       Social History   reports that he has been smoking cigars. He does not have any smokeless tobacco history on file.   Family History   His family history includes CAD in his father and mother; CVA in his father.   Allergies Allergies  Allergen Reactions  . Ibuprofen Other (See Comments)    Stomach irritation      Home Medications  Prior to Admission medications   Not on File      Total critical care time: 35 minutes  Performed by: Cheri FowlerSudham Jacqualine Weichel   Critical care time was exclusive of separately billable procedures and treating other patients.   Critical care was necessary to treat or prevent imminent or life-threatening deterioration.   Critical care was time spent personally by me on the following activities: development of treatment plan with patient and/or surrogate as well as nursing, discussions with consultants, evaluation of patient's response to treatment, examination of patient, obtaining history from patient or surrogate, ordering and performing treatments and interventions, ordering and review of laboratory studies, ordering and review of radiographic studies, pulse oximetry and re-evaluation of patient's  condition.   Cheri FowlerSudham Sandro Burgo MD Critical care physician [facility]  Pager: [phone] Mobile: [phone]

## 2020-03-05 ENCOUNTER — Encounter (HOSPITAL_COMMUNITY): Payer: Self-pay

## 2020-03-05 DIAGNOSIS — J988 Other specified respiratory disorders: Secondary | ICD-10-CM | POA: Diagnosis not present

## 2020-03-05 DIAGNOSIS — I482 Chronic atrial fibrillation, unspecified: Secondary | ICD-10-CM | POA: Diagnosis not present

## 2020-03-05 DIAGNOSIS — D72829 Elevated white blood cell count, unspecified: Secondary | ICD-10-CM

## 2020-03-05 DIAGNOSIS — R509 Fever, unspecified: Secondary | ICD-10-CM | POA: Diagnosis not present

## 2020-03-05 DIAGNOSIS — I63511 Cerebral infarction due to unspecified occlusion or stenosis of right middle cerebral artery: Secondary | ICD-10-CM | POA: Diagnosis not present

## 2020-03-05 DIAGNOSIS — I63429 Cerebral infarction due to embolism of unspecified anterior cerebral artery: Secondary | ICD-10-CM | POA: Diagnosis not present

## 2020-03-05 LAB — CBC
HCT: 44.4 % (ref 39.0–52.0)
Hemoglobin: 13.9 g/dL (ref 13.0–17.0)
MCH: 32 pg (ref 26.0–34.0)
MCHC: 31.3 g/dL (ref 30.0–36.0)
MCV: 102.1 fL — ABNORMAL HIGH (ref 80.0–100.0)
Platelets: 139 10*3/uL — ABNORMAL LOW (ref 150–400)
RBC: 4.35 MIL/uL (ref 4.22–5.81)
RDW: 14.5 % (ref 11.5–15.5)
WBC: 11.7 10*3/uL — ABNORMAL HIGH (ref 4.0–10.5)
nRBC: 0 % (ref 0.0–0.2)

## 2020-03-05 LAB — BASIC METABOLIC PANEL
Anion gap: 13 (ref 5–15)
BUN: 33 mg/dL — ABNORMAL HIGH (ref 8–23)
CO2: 31 mmol/L (ref 22–32)
Calcium: 9.2 mg/dL (ref 8.9–10.3)
Chloride: 109 mmol/L (ref 98–111)
Creatinine, Ser: 1.09 mg/dL (ref 0.61–1.24)
GFR, Estimated: >60 mL/min (ref >60)
Glucose, Bld: 183 mg/dL — ABNORMAL HIGH (ref 70–99)
Potassium: 3.4 mmol/L — ABNORMAL LOW (ref 3.5–5.1)
Sodium: 153 mmol/L — ABNORMAL HIGH (ref 135–145)

## 2020-03-05 LAB — GLUCOSE, CAPILLARY
Glucose-Capillary: 218 mg/dL — ABNORMAL HIGH (ref 70–99)
Glucose-Capillary: 186 mg/dL — ABNORMAL HIGH (ref 70–99)
Glucose-Capillary: 247 mg/dL — ABNORMAL HIGH (ref 70–99)
Glucose-Capillary: 230 mg/dL — ABNORMAL HIGH (ref 70–99)
Glucose-Capillary: 213 mg/dL — ABNORMAL HIGH (ref 70–99)
Glucose-Capillary: 193 mg/dL — ABNORMAL HIGH (ref 70–99)

## 2020-03-05 LAB — TRIGLYCERIDES: Triglycerides: 125 mg/dL (ref <150)

## 2020-03-05 MED ORDER — POTASSIUM CHLORIDE 20 MEQ PO PACK
40.0000 meq | PACK | Freq: Once | ORAL | Status: AC
  Administered 2020-03-05: 40 meq
  Filled 2020-03-05: qty 2

## 2020-03-05 MED ORDER — INSULIN GLARGINE 100 UNIT/ML ~~LOC~~ SOLN
15.0000 [IU] | Freq: Every day | SUBCUTANEOUS | Status: DC
  Administered 2020-03-05: 15 [IU] via SUBCUTANEOUS
  Filled 2020-03-05: qty 0.15

## 2020-03-05 MED ORDER — HYDROCHLOROTHIAZIDE 25 MG PO TABS
12.5000 mg | ORAL_TABLET | Freq: Every day | ORAL | Status: DC
  Administered 2020-03-05 – 2020-03-06 (×2): 12.5 mg
  Filled 2020-03-05 (×2): qty 1

## 2020-03-05 MED ORDER — INSULIN GLARGINE 100 UNIT/ML ~~LOC~~ SOLN
20.0000 [IU] | Freq: Every day | SUBCUTANEOUS | Status: DC
  Administered 2020-03-06 – 2020-03-10 (×5): 20 [IU] via SUBCUTANEOUS
  Filled 2020-03-05 (×5): qty 0.2

## 2020-03-05 NOTE — Evaluation (Signed)
Clinical/Bedside Swallow Evaluation Patient Details  Name: Richard Farmer MRN: [id number] Date of Birth: 1940/08/27  Today's Date: 03/05/2020 Time: SLP Start Time (ACUTE ONLY): 1008 SLP Stop Time (ACUTE ONLY): 1020 SLP Time Calculation (min) (ACUTE ONLY): 12 min  Past Medical History:  Past Medical History:  Diagnosis Date  . Atrial fibrillation (HCC)   . CAD (coronary artery disease)   . Carotid artery disease (HCC)   . Diabetes mellitus (HCC)   . Gout   . Hyperlipidemia   . Hypertension    Past Surgical History:  Past Surgical History:  Procedure Laterality Date  . CORONARY STENT PLACEMENT    . IR ANGIO INTRA EXTRACRAN SEL COM CAROTID INNOMINATE UNI L MOD SED  02/15/2020  . IR CT HEAD LTD  02/15/2020  . IR INTRAVSC STENT CERV CAROTID W/O EMB-PROT MOD SED INC ANGIO  02/09/2020  . IR PERCUTANEOUS ART THROMBECTOMY/INFUSION INTRACRANIAL INC DIAG ANGIO  02/07/2020  . IR US GUIDE VASC ACCESS RIGHT  03/06/2020  . PROSTATE SURGERY    . RADIOLOGY WITH ANESTHESIA N/A 02/07/2020   Procedure: IR WITH ANESTHESIA - CODE STROKE;  Surgeon: Radiologist, Medication, MD;  Location: [facility] OR;  Service: Radiology;  Laterality: N/A;  . REPLACEMENT TOTAL KNEE Right   . SPINE SURGERY     HPI:  79 y.o. M of atrial fibrillation on coumadin, CAD, carotid stenosis, DM, HL and HTN who presented with L-sided weakness and found to have right ICA cervical occlusion s/p stenting right ICA and unsuccessful thrombectomy of right MCA/M1 and intracranial ICA. Intubated from 11/24-11/26/21.   Assessment / Plan / Recommendation Clinical Impression  Pt presents with several risk factors for aspiration, including CN VII deficits from acute stroke, recent intubation with significant dysphonia, weak cough, cognitive impairment, and deconditioning. His oral cavity is coated with dried secretions even after efforts from SLP to improve oral hygiene. When trying ice chips he needs Mod cues to maintain attention to bolus  manipulation and posterior transit. No overt coughing is noted when he does swallow, but he sounds increasingly wet and cannot perform a productive, cued cough. Recommend that he remain NPO for now with ongoing use of temporary, alternative means of nutrition. Will f/u for readiness to complete instrumental testing.   SLP Visit Diagnosis: Dysphagia, unspecified (R13.10)    Aspiration Risk  Moderate aspiration risk;Severe aspiration risk    Diet Recommendation NPO;Alternative means - temporary   Medication Administration: Via alternative means    Other  Recommendations Oral Care Recommendations: Oral care QID Other Recommendations: Have oral suction available   Follow up Recommendations Inpatient Rehab      Frequency and Duration min 2x/week  2 weeks       Prognosis Prognosis for Safe Diet Advancement: Good Barriers to Reach Goals: Cognitive deficits      Swallow Study   General HPI: 79 y.o. M of atrial fibrillation on coumadin, CAD, carotid stenosis, DM, HL and HTN who presented with L-sided weakness and found to have right ICA cervical occlusion s/p stenting right ICA and unsuccessful thrombectomy of right MCA/M1 and intracranial ICA. Intubated from 11/24-11/26/21. Type of Study: Bedside Swallow Evaluation Previous Swallow Assessment: none in chart Diet Prior to this Study: NPO;NG Tube Temperature Spikes Noted: No Respiratory Status: Nasal cannula History of Recent Intubation: Yes Length of Intubations (days): 2 days Date extubated: 03/02/20 Behavior/Cognition: Alert;Cooperative;Requires cueing Oral Cavity Assessment: Dried secretions;Dry Oral Care Completed by SLP: Yes Oral Cavity - Dentition: Adequate natural dentition Self-Feeding Abilities: Needs assist Patient Positioning: Upright  in bed Baseline Vocal Quality: Hoarse;Low vocal intensity Volitional Cough: Weak;Congested Volitional Swallow: Able to elicit    Oral/Motor/Sensory Function Overall Oral Motor/Sensory  Function: Generalized oral weakness Facial ROM: Reduced left;Suspected CN VII (facial) dysfunction Facial Symmetry: Abnormal symmetry left;Suspected CN VII (facial) dysfunction Facial Strength: Reduced left;Suspected CN VII (facial) dysfunction   Ice Chips Ice chips: Impaired Presentation: Spoon Oral Phase Functional Implications: Oral holding Pharyngeal Phase Impairments: Wet Vocal Quality   Thin Liquid Thin Liquid: Not tested    Nectar Thick Nectar Thick Liquid: Not tested   Honey Thick Honey Thick Liquid: Not tested   Puree Puree: Not tested   Solid     Solid: Not tested      Mahala Menghini., M.A. CCC-SLP Acute Rehabilitation Services Pager [phone] Office [phone]  03/05/2020,12:54 PM

## 2020-03-05 NOTE — Progress Notes (Signed)
STROKE TEAM PROGRESS NOTE   INTERVAL HISTORY No family is at bedside. Pt lying in bed, still has copious oral secretion with intermittent coughing. However, seems more awake alert, easily open eyes, following simple commands on the right. Na 153, off 3% saline on TF, afebrile, HR fluctuate. CT yesterday stable.  Vitals:   03/05/20 0500 03/05/20 0600 03/05/20 0700 03/05/20 0800  BP: 137/86 (!) 153/98 (!) 156/94   Pulse: 100 74 97   Resp: (!) 22 (!) 21 (!) 24   Temp:    99.4 F (37.4 C)  TempSrc:    Axillary  SpO2: 100% 100% 100%   Weight: 87 kg     Height:       CBC:  Recent Labs  Lab 30-Mar-2020 1317 03/30/20 1321 03/04/20 1040 03/05/20 0610  WBC 6.3   < > 11.9* 11.7*  NEUTROABS 4.2  --   --   --   HGB 13.6   < > 13.5 13.9  HCT 42.1   < > 41.6 44.4  MCV 100.5*   < > 99.0 102.1*  PLT 161   < > 126* 139*   < > = values in this interval not displayed.   Basic Metabolic Panel:  Recent Labs  Lab 03/03/20 1651 03/03/20 2205 03/04/20 1040 03/04/20 1040 03/04/20 1626 03/05/20 0610  NA 153*   < > 153*   < > 155* 153*  K  --   --  3.6  --   --  3.4*  CL  --   --  115*  --   --  109  CO2  --   --  28  --   --  31  GLUCOSE  --   --  223*  --   --  183*  BUN  --   --  26*  --   --  33*  CREATININE  --   --  1.09  --   --  1.09  CALCIUM  --   --  8.9  --   --  9.2  MG 2.0  --  1.7  --  1.9  --   PHOS 1.8*  --  2.4*  --   --   --    < > = values in this interval not displayed.   Lipid Panel:  Recent Labs  Lab 03/02/20 0432 03/03/20 0613 03/05/20 0610  CHOL 96  --   --   TRIG 132   < > 125  HDL 28*  --   --   CHOLHDL 3.4  --   --   VLDL 26  --   --   LDLCALC 42  --   --    < > = values in this interval not displayed.   HgbA1c:  Recent Labs  Lab 03/01/20 0500  HGBA1C 6.3*   Urine Drug Screen: No results for input(s): LABOPIA, COCAINSCRNUR, LABBENZ, AMPHETMU, THCU, LABBARB in the last 168 hours.  Alcohol Level No results for input(s): ETH in the last 168  hours.  IMAGING past 24 hours No results found.   PHYSICAL EXAM  Temp:  [98 F (36.7 C)-99.4 F (37.4 C)] 99.4 F (37.4 C) (11/29 0800) Pulse Rate:  [56-110] 97 (11/29 0700) Resp:  [16-31] 24 (11/29 0700) BP: (108-172)/(80-103) 156/94 (11/29 0700) SpO2:  [93 %-100 %] 100 % (11/29 0700) Weight:  [87 kg] 87 kg (11/29 0500)  General - Well nourished, well developed, lethargic, eyes open on voice, intermittent cough with oral secreation.  Ophthalmologic - fundi not visualized due to noncooperation.  Cardiovascular - irregularly irregular heart rate and rhythm.  Neuro - Eyes open with voice, lethargic, able to follow simple commands on the right and midline. Nonverbal though. With forced eye opening, pt not blinking to visual threat bilaterally, right gaze preference, not able to cross midline. Able to track on the right with eye movement to the right. PERRL. Mild left facial weakness.  Tongue protrusion not cooperative.  Motor system exam shows spontaneous purposeful right upper extremity movements barely against gravity, RLE withdraw 3-/5 to pain and moving toes PF/DF on request.  Left upper and lower extremity hypotonic and slight withdraw respond to painful stimulus. No babinski bilaterally.  Gait not tested   ASSESSMENT/PLAN Mr. Reginold Beale is a 79 y.o. male with history of AF on warfarin, know ICA stenosis presenting with L sided weakness and hemianopsia. He did not receive tPA.  Taken to IR for emergent stenting of R ICA occlusion.  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 Large vessel disease  CT head no acute abnormality. Possible R forehead scalp hematoma/contusion  CTA head & neck R cervical ICA origin occlusion with reconstitution at terminus. Critical stenosis brachiocephalic artery. Severe stenosis R P2. Multifocal moderate  R A2, proximal R P2, severe distal R P2, B mid VA, L VA origin stenoses. B ICA atherosclerosis w/ L ICA web. Severe degenerative C6-7 disc dz.  CT perfusion large penumbra  Cerebral angio / IR - R ICA occlusion from bulb to proximal ICA terminus. Stent R ICA. ICA remains occluded after ICA stent, secondary to occlusion at the supraclinoid segment. Right M1 remains occluded, embolization to new territory.  MRI  R MCA infarct w/ sparing of putamen, globus pallidus and caudate head. Petechial hemorrhage. No shift. Abnormal flor R ICA w/ partial dissection.  CT Head 11/28 - Continued interval evolution of subacute right MCA territory infarct, stable in size and distribution from previous. Slightly worsened localized edema and partial effacement of the right lateral ventricle, but with no more than trace right-to-left midline shift.  2D Echo EF 60-65%. No source of embolus. LA moderately dilated  LDL 42     HgbA1c 6.3  VTE prophylaxis - Lovenox 40 mg sq daily   aspirin 81 mg daily and warfarin daily prior to admission w/ INR 1.4 on arrival, now on aspirin 81 mg daily and clopidogrel 75 mg daily.   Therapy recommendations:  CIR vs SNF  Disposition:  pending   Acute Respiratory Failure  Secondary to stroke  Intubated for IR, extubated 03/02/2020  Continue to have copious secretions  Close monitoring respiratory status  CCM on board   Cytotoxic cerebral edema Induced Hypernatremia  MRI large right MCA infarct with cytotoxic edema and effacement of right lateral ventricle  On 3% at 75cc -> 50cc / hr->off   Goal Na 145-155  Na 145->146->149->150->148->153->155->153  CT 11/28 - Continued interval evolution of subacute right MCA territory infarct, stable in size and distribution from previous. Slightly worsened localized edema and partial effacement of the right lateral ventricle, but with no more than trace right-to-left midline shift.  Chronic Atrial Fibrillation on Coumadin  with subtherapeutic INR  Home anticoagulation:  warfarin daily + asa 81  Admission INR 1.4 -> 1.5  On DAPT now  Not an AC candidate at this time d/t large  stroke sites and risk of hemorrhagic transformation    Consider resume anticoagulation in 10 to 14 days post stroke  Aspiration pneumonia LLL  Fever T-max 100.6->100.1->afebrile  Leukocytosis WBC 11.9->11.7  Extubated 11/26 but still have copious secretions  CXR small left pleural effusion and infiltration or atelectasis in the left base  On Unasyn  CCM on board  NT suctioning as needed  On 3% neb and Chest PT  Hypertension  Home meds:  Coreg 12.5 bid, losartan 50, Maxzide . BP stable . Resume Coreg 12.5->25 bid  . Resume losartan 50->100 . Resume HCTZ 12.5 . Long-term BP goal normotensive  Hyperlipidemia  Home meds:  lipitor 80 + omega 3  LDL 42, goal < 70  On Lipitor 40  Continue statin on discharge  Diabetes type II Controlled  Home meds - amaryl  HgbA1c 6.3, goal < 7.0  CBGs  SSI  On Lantus 15->20 U daily  Still has mild hyperglycemia  PT follow-up  Dysphagia . Secondary to stroke . NPO . Cortrak on TF @ 65 . Speech on board   Tobacco abuse  Current smoker  Smoking cessation counseling will be provided  Pt is willing to quit  Other Stroke Risk Factors  Advanced Age >/= 65   ETOH use 4x wk  Obesity, Body mass index is 30.96 kg/m., BMI >/= 30 associated with increased stroke risk, recommend weight loss, diet and exercise as appropriate   Family hx stroke (father)  Coronary artery disease s/p stent  Other Active Problems  Gout on allopurinal 300 daily  GERD on prilosec  BPH on flomax  Hypokalemia K 2.4->4.4->3.7->3.3->3.0->3.6->3.4 - supplement  Mild thrombocytopenia - platelets - 161->127->126->139  Hospital day # 5  This patient is critically ill due to large right MCA stroke, chronic A. fib, cerebral edema, respirate distress, aspiration pneumonia,  hypertension and at significant risk of neurological worsening, death form recurrent stroke, brain herniation, seizure, hemorrhagic conversion, heart failure, sepsis. This patient's care requires constant monitoring of vital signs, hemodynamics, respiratory and cardiac monitoring, review of multiple databases, neurological assessment, discussion with family, other specialists and medical decision making of high complexity. I spent 35 minutes of neurocritical care time in the care of this patient.   Marvel Plan, MD PhD Stroke Neurology 03/05/2020 8:29 AM   To contact Stroke Continuity provider, please refer to [email or web address]. After hours, contact General Neurology

## 2020-03-05 NOTE — Progress Notes (Signed)
  Speech Language Pathology Treatment: Cognitive-Linquistic  Patient Details Name: Richard Farmer MRN: [id number] DOB: Mar 28, 1941 Today's Date: 03/05/2020 Time: 4081-4481 SLP Time Calculation (min) (ACUTE ONLY): 11 min  Assessment / Plan / Recommendation Clinical Impression  Pt was alert and cooperative despite keeping his eyes closed, but needed Mod cues for sustained attention; Min cues for completion of simple one-step commands. He is oriented to location and situation but needs Mod cues for intellectual awareness. Pt will continue to benefit from SLP therapy to address cognition and communication.    HPI HPI: 79 y.o. M of atrial fibrillation on coumadin, CAD, carotid stenosis, DM, HL and HTN who presented with L-sided weakness and found to have right ICA cervical occlusion s/p stenting right ICA and unsuccessful thrombectomy of right MCA/M1 and intracranial ICA. Intubated from 11/24-11/26/21.      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 CCC-SLP Acute Rehabilitation Services Pager [phone] Office [phone]  03/05/2020, 1:23 PM

## 2020-03-05 NOTE — Progress Notes (Signed)
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 No family or staff at bedside.  Allergies: Ibuprofen  Medications: Prior to Admission medications   Medication Sig Start Date End Date Taking? Authorizing Provider  allopurinol (ZYLOPRIM) 300 MG tablet Take 300 mg by mouth daily. 01/28/20  Yes [provider]  aspirin 81 MG EC tablet Take 81 mg by mouth daily.   Yes [provider]  atorvastatin (LIPITOR) 80 MG tablet Take 80 mg by mouth daily. 01/10/20  Yes [provider]  calcium-vitamin D (OSCAL WITH D) 500-200 MG-UNIT TABS tablet Take 1 tablet by mouth 2 (two) times daily.   Yes [provider]  carvedilol (COREG) 12.5 MG tablet Take 12.5 mg by mouth 2 (two) times daily. 02/11/20  Yes [provider]  Cyanocobalamin 2500 MCG SUBL Place 1 tablet under the tongue daily.   Yes [provider]  glimepiride (AMARYL) 4 MG tablet Take 4 mg by mouth daily. 01/13/20  Yes [provider]  losartan (COZAAR) 100 MG tablet Take 50 mg by mouth daily.  01/30/20  Yes [provider]  Multiple Vitamins-Minerals (PRESERVISION/LUTEIN PO) Take 1 capsule by mouth daily.   Yes [provider]  Omega-3 1000 MG CAPS Take 1 g by mouth daily.   Yes [provider]  omeprazole (PRILOSEC) 40 MG capsule Take 40 mg by mouth daily. 12/28/19  Yes [provider]  Propylene Glycol (SYSTANE BALANCE) 0.6 % SOLN Place 1 drop into both eyes as needed.   Yes [provider]  tamsulosin (FLOMAX) 0.4 MG CAPS capsule Take 0.4 mg by mouth daily. 12/28/19  Yes [provider]  traMADol (ULTRAM) 50 MG tablet Take 50 mg by mouth daily as needed (pain).  01/31/20  Yes [provider]  triamterene-hydrochlorothiazide (MAXZIDE-25) 37.5-25 MG tablet Take 1 tablet by mouth daily. 01/30/20  Yes [provider]  warfarin (COUMADIN) 5 MG tablet Take 2.5-5 mg by mouth See admin instructions.  daily except 2.5mg  on Mondays and Fridays  01/20/20  Yes [provider]     Vital Signs: BP (!) 156/94   Pulse 97   Temp 99.4 F (37.4 C) (Axillary)   Resp (!) 24   Ht  (1.676 m)   Wt 191 lb 12.8 oz (87 kg)   SpO2 100%   BMI 30.96 kg/m   Physical Exam Vitals and nursing note reviewed.  Constitutional:      Comments: Somnolent, arouses to tactile stimuli - becomes agitated but does not follow commands  HENT:     Head: Normocephalic.  Cardiovascular:     Rate and Rhythm: Normal rate and regular rhythm.     Comments: (+) right CFA puncture soft, non tender, non pulsatile without erythema, bleeding or drainage. Pulmonary:     Effort: Pulmonary effort is normal.     Breath sounds: Normal breath sounds.  Skin:    General: Skin is warm and dry.  Neurological:     Comments: Moves right upper extremity spontaneously to touch, does not move other extremities on [place]  Imaging: CT HEAD WO CONTRAST  Result Date: 03/04/2020 CLINICAL DATA:  Follow-up examination for acute stroke. EXAM: CT HEAD WITHOUT CONTRAST TECHNIQUE: Contiguous axial images were obtained from the base of the skull through the vertex without intravenous contrast. COMPARISON:  Prior CT from 03/02/2020. FINDINGS: Brain: Continued interval evolution of cytotoxic edema involving a large portion of the right frontal and temporal regions, consistent with evolving subacute right MCA territory infarct. Overall, size and distribution is not significantly changed from previous. Involvement of the right basal ganglia again noted. Localized edema with partial effacement  of the right lateral ventricle has slightly worsened from previous. No more than trace right-to-left midline shift. No hydrocephalus or trapping. Basilar cisterns remain widely patent. No evidence for hemorrhagic transformation. Remainder the brain is otherwise stable in appearance. No other new large vessel territory infarct. Underlying atrophy with chronic microvascular ischemic disease again noted. No extra-axial fluid collection. Vascular: No new hyperdense vessel. Calcified atherosclerosis present at skull base. Skull: Scalp soft tissues and calvarium are unchanged and remain within normal limits. Sinuses/Orbits: Globes and orbital soft tissues demonstrate no acute finding. Left sphenoid sinus disease noted. Nasogastric tube in place in the left nasal cavity. Mastoid air cells remain clear. Other: None. IMPRESSION: 1. Continued interval evolution of subacute right MCA territory infarct, stable in size and distribution from previous. Slightly worsened localized edema and partial effacement of the right lateral ventricle, but with no more than trace right-to-left midline shift. No hydrocephalus or trapping. No evidence for hemorrhagic transformation. 2. No other new acute intracranial abnormality. Electronically Signed   By: Rise Mu M.D.   On: 03/04/2020 05:31   CT Head Wo Contrast  Result Date: 03/02/2020 CLINICAL DATA:  Follow-up examination for acute stroke. EXAM: CT HEAD WITHOUT CONTRAST TECHNIQUE: Contiguous axial images were obtained from the base of the skull through the vertex without intravenous contrast. COMPARISON:  Prior CT and MRI from 03/01/2020 FINDINGS: Brain: Continued interval evolution of cytotoxic edema involving a large portion of the right frontal and temporal regions, consistent with evolving right MCA territory infarct. Overall, size and distribution is stable from prior MRI. Involvement of the right basal ganglia again noted. Localized edema with partial effacement of  the right lateral ventricle without significant midline shift. No hemorrhagic transformation or other complication. No other new acute large vessel territory infarct or hemorrhage. Underlying atrophy with chronic microvascular ischemic disease again noted. No extra-axial fluid collection. Vascular: No hyperdense vessel. Calcified atherosclerosis present at skull base. Skull: Scalp soft tissues and calvarium within normal limits. Sinuses/Orbits: Globes orbital soft tissues demonstrate no acute finding. Scattered mucosal thickening noted throughout the paranasal sinuses. Endotracheal and enteric tubes partially visualized. No significant mastoid effusion. Other: None. IMPRESSION: 1. 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 No hemorrhagic transformation or other complication. Localized edema without midline shift. 2. No other new acute intracranial abnormality. Electronically Signed   By: Rise Mu M.D.   On: 03/02/2020 04:52   MR BRAIN WO CONTRAST  Result Date: 03/01/2020 CLINICAL DATA:  Acute right MCA infarction by CT. EXAM: MRI HEAD WITHOUT CONTRAST TECHNIQUE: Multiplanar, multiecho pulse sequences of the brain and surrounding structures were obtained without intravenous contrast. COMPARISON:  CT same day.  Multiple CT studies done yesterday. FINDINGS: Brain: Diffusion imaging shows acute infarction affecting the right middle cerebral artery territory with sparing of the putamen, globus pallidus and caudate head. Infarction affects the caudate body. Region of cortical and subcortical infarction measures up to 11.5 cm. Mild swelling. Minimal petechial blood products  in the region of the infarction. No measurable hematoma. No midline shift. No extra-axial fluid collection. Vascular: Abnormal flow pattern in the right ICA at the base of the brain. There appears to be partial thrombosis or dissection. Skull and upper cervical  spine: Negative Sinuses/Orbits: Mucosal inflammatory changes of the paranasal sinuses. Orbits negative. Chronic staphylomas. Other: None IMPRESSION: 1. Acute infarction affecting the right middle cerebral artery territory with sparing of the putamen, globus pallidus and caudate head. Region of cortical and subcortical infarction measures up to 11.5 cm. Minimal petechial blood products in the region of the infarction. No measurable hematoma. No midline shift. 2. Abnormal flow pattern in the right ICA at the base of the brain. There appears to be partial thrombosis or dissection. Electronically Signed   By: Paulina FusiMark  Shogry M.D.   On: 03/01/2020 16:36   DG CHEST PORT 1 VIEW  Result Date: 03/03/2020 CLINICAL DATA:  Fever EXAM: PORTABLE CHEST 1 VIEW COMPARISON:  02/19/2020 FINDINGS: Endotracheal tube is been removed. Enteric tube is in place with tip below the left hemidiaphragm but off the field of view. Cardiac enlargement. Probable small left pleural effusion. Infiltration or atelectasis in the left base. Hazy interstitial changes in the lungs may represent edema. Calcification of the aorta. IMPRESSION: Cardiac enlargement with probable small left pleural effusion and infiltration or atelectasis in the left base. Hazy interstitial changes in the lungs may represent edema. Electronically Signed   By: Burman NievesWilliam  Stevens M.D.   On: 03/03/2020 07:10   ECHOCARDIOGRAM COMPLETE  Result Date: 03/01/2020    ECHOCARDIOGRAM REPORT   Patient Name:   Margarette CanadaHUGH Mousseau Date of Exam: 03/01/2020 Medical Rec #:  [id number]   Height:       66.0 in Accession #:    [id number]  Weight:       191.8 lb Date of Birth:  September 21, 1940   BSA:          1.965 m Patient Age:    79 years    BP:           138/67 mmHg Patient Gender: M           HR:           91 bpm. Exam Location:  Inpatient Procedure: 2D Echo, Color Doppler and Cardiac Doppler Indications:    Stroke i163.9  History:        Patient has no prior history of Echocardiogram examinations.                  CAD, Arrythmias:Atrial Fibrillation; Risk Factors:Hypertension,                 Diabetes and Dyslipidemia.  Sonographer:    Irving BurtonEmily Senior RDCS Referring Phys: [id number] [facility]  Sonographer Comments: Scanned supine on artificial respirator. IMPRESSIONS  1. Left ventricular ejection fraction, by estimation, is 60 to 65%. The left ventricle has normal function. The left ventricle has no regional wall motion abnormalities. There is mild left ventricular hypertrophy. Left ventricular diastolic parameters are indeterminate.  2. Right ventricular systolic function is normal. The right ventricular size is mildly enlarged. There is mildly elevated pulmonary artery systolic pressure. The estimated right ventricular systolic pressure is 37.4 mmHg.  3. Left atrial size was moderately dilated.  4. Right atrial size was moderately dilated.  5. The mitral valve is normal in structure. Trivial mitral valve regurgitation. No evidence of mitral stenosis.  6. Tricuspid valve regurgitation is moderate.  7. The aortic valve is calcified. There is  moderate calcification of the aortic valve. There is moderate thickening of the aortic valve. Aortic valve regurgitation is not visualized. Mild aortic valve stenosis. Aortic valve mean gradient measures 15.6 mmHg. Aortic valve Vmax measures 2.56 m/s.  8. The inferior vena cava is dilated in size with >50% respiratory variability, suggesting right atrial pressure of 8 mmHg. FINDINGS  Left Ventricle: Left ventricular ejection fraction, by estimation, is 60 to 65%. The left ventricle has normal function. The left ventricle has no regional wall motion abnormalities. The left ventricular internal cavity size was normal in size. There is  mild left ventricular hypertrophy. Left ventricular diastolic parameters are indeterminate. Right Ventricle: The right ventricular size is mildly enlarged. No increase in right ventricular wall thickness. Right ventricular systolic function is  normal. There is mildly elevated pulmonary artery systolic pressure. The tricuspid regurgitant velocity is 2.71 m/s, and with an assumed right atrial pressure of 8 mmHg, the estimated right ventricular systolic pressure is 37.4 mmHg. Left Atrium: Left atrial size was moderately dilated. Right Atrium: Right atrial size was moderately dilated. Pericardium: There is no evidence of pericardial effusion. Mitral Valve: The mitral valve is normal in structure. Trivial mitral valve regurgitation. No evidence of mitral valve stenosis. Tricuspid Valve: The tricuspid valve is normal in structure. Tricuspid valve regurgitation is moderate . No evidence of tricuspid stenosis. Aortic Valve: The aortic valve is calcified. There is moderate calcification of the aortic valve. There is moderate thickening of the aortic valve. Aortic valve regurgitation is not visualized. Mild aortic stenosis is present. Aortic valve mean gradient measures 15.6 mmHg. Aortic valve peak gradient measures 26.3 mmHg. Aortic valve area, by VTI measures 0.91 cm. Pulmonic Valve: The pulmonic valve was normal in structure. Pulmonic valve regurgitation is not visualized. No evidence of pulmonic stenosis. Aorta: The aortic root is normal in size and structure. Venous: The inferior vena cava is dilated in size with greater than 50% respiratory variability, suggesting right atrial pressure of 8 mmHg. IAS/Shunts: No atrial level shunt detected by color flow Doppler.  LEFT VENTRICLE PLAX 2D LVIDd:         3.70 cm LVIDs:         1.80 cm LV PW:         1.20 cm LV IVS:        1.30 cm LVOT diam:     2.00 cm LV SV:         43 LV SV Index:   22 LVOT Area:     3.14 cm  RIGHT VENTRICLE RV S prime:     8.16 cm/s TAPSE (M-mode): 1.3 cm LEFT ATRIUM             Index       RIGHT ATRIUM           Index LA diam:        5.10 cm 2.60 cm/m  RA Area:     21.30 cm LA Vol (A2C):   83.2 ml 42.35 ml/m RA Volume:   63.50 ml  32.32 ml/m LA Vol (A4C):   90.4 ml 46.02 ml/m LA Biplane  Vol: 88.4 ml 45.00 ml/m  AORTIC VALVE AV Area (Vmax):    0.85 cm AV Area (Vmean):   0.89 cm AV Area (VTI):     0.91 cm AV Vmax:           256.20 cm/s AV Vmean:          185.400 cm/s AV VTI:  0.476 m AV Peak Grad:      26.3 mmHg AV Mean Grad:      15.6 mmHg LVOT Vmax:         69.13 cm/s LVOT Vmean:        52.700 cm/s LVOT VTI:          0.137 m LVOT/AV VTI ratio: 0.29  AORTA Ao Root diam: 3.70 cm Ao Asc diam:  3.70 cm TRICUSPID VALVE TR Peak grad:   29.4 mmHg TR Vmax:        271.00 cm/s  SHUNTS Systemic VTI:  0.14 m Systemic Diam: 2.00 cm Donato Schultz MD Electronically signed by Donato Schultz MD Signature Date/Time: 03/01/2020/2:44:19 PM    Final     Labs:  CBC: Recent Labs    03/02/20 0432 03/03/20 0613 03/04/20 1040 03/05/20 0610  WBC 9.6 11.0* 11.9* 11.7*  HGB 12.8* 13.7 13.5 13.9  HCT 38.4* 41.9 41.6 44.4  PLT 126* 127* 126* 139*    COAGS: Recent Labs    2020-03-24 1317 03/01/20 0500  INR 1.4* 1.5*  APTT 30  --     BMP: Recent Labs    03/02/20 0432 03/02/20 1037 03/03/20 0613 03/03/20 1037 03/03/20 2205 03/04/20 1040 03/04/20 1626 03/05/20 0610  NA 145   < > 148*   < > 155* 153* 155* 153*  K 3.3*  --  3.0*  --   --  3.6  --  3.4*  CL 112*  --  114*  --   --  115*  --  109  CO2 20*  --  22  --   --  28  --  31  GLUCOSE 132*  --  293*  --   --  223*  --  183*  BUN 21  --  18  --   --  26*  --  33*  CALCIUM 8.7*  --  9.1  --   --  8.9  --  9.2  CREATININE 0.90  --  0.93  --   --  1.09  --  1.09  GFRNONAA >60  --  >60  --   --  >60  --  >60   < > = values in this interval not displayed.    LIVER FUNCTION TESTS: Recent Labs    03-24-20 1317 03/01/20 0500  BILITOT 1.3* 1.4*  AST 21 17  ALT 23 19  ALKPHOS 82 67  PROT 6.3* 5.1*  ALBUMIN 3.4* 3.1*    Assessment and Plan:  79 y/o M who presented to [facility] as a code stroke s/p PTA and stenting of the right ICA, 21mm-7mm Xact stent, presuming a permissive lesion of the bifurcation with attempted  thrombectomy of the intracranial ICA below terminus and of the right MCA/M1 (embolism new territory), unsuccessful March 24, 2020 with Dr. Loreta Ave seen today for routine follow up.  Patient extubated, NG in place, does not follow commands on exam today but is somnolent this AM - becomes agitated with tactile stimuli and spontaneously moves right upper extremity, no movement noted on left side, does not open eyes or attempt to speak.  Patient currently receiving Plavix 75 mg QD + ASA 81 mg QD per primary team.  Further plans per neurology/CCM - NIR remains available as needed, please call with any questions or concerns.  Electronically Signed: Villa Herb, PA-C 03/05/2020, 9:41 AM   I spent a total of 15 Minutes at the the patient's bedside AND on the patient's hospital floor or unit,  greater than 50% of which was counseling/coordinating care for right ICA stenting/attempted thrombectomy follow up.

## 2020-03-05 NOTE — Progress Notes (Signed)
Physical Therapy Treatment Patient Details Name: Richard Farmer MRN: [id number] DOB: [birth year] Today's Date: 03/05/2020    History of Present Illness This 79 y.o. male admitted with acute onset Lt sided weakness.  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  CT of head showed large Rt MCA territory infarct, no hemorrhagic transformation, localized edema.  HTN, Gout, DM, CAD, A-Fib, s/p Rt TKA,     PT Comments    Pt progressing slowly toward goals, Noted associated reactions L LE when R moved, minor L UE adduction grossly.  Emphasis on rolling for pericare, transitions to EOB, sitting balance/truncal activation, sit to stand.  Pt following simple commands once redirected to therapy.  Difficult to determine cognition.    Follow Up Recommendations  CIR;SNF;Other (comment)     Equipment Recommendations  Other (comment);Wheelchair (measurements PT);Wheelchair cushion (measurements PT) (TBD)    Recommendations for Other Services       Precautions / Restrictions Precautions Precautions: Fall    Mobility  Bed Mobility Overal bed mobility: Needs Assistance Bed Mobility: Rolling Rolling: Total assist         General bed mobility comments: requires assist for all aspects   Transfers Overall transfer level: Needs assistance   Transfers: Sit to/from Stand Sit to Stand: Total assist;+2 physical assistance         General transfer comment: pt assisted with R side only  Ambulation/Gait             General Gait Details: NT   Stairs             Wheelchair Mobility    Modified Rankin (Stroke Patients Only) Modified Rankin (Stroke Patients Only) Pre-Morbid Rankin Score: No symptoms Modified Rankin: Severe disability     Balance Overall balance assessment: Needs assistance Sitting-balance support: Single extremity supported;Feet supported Sitting balance-Leahy Scale:  Poor Sitting balance - Comments: sat EOB for ~15 min working on balance/truncal activation/control.     Standing balance-Leahy Scale: Zero                              Cognition Arousal/Alertness: Awake/alert;Lethargic Behavior During Therapy: Flat affect Overall Cognitive Status: Difficult to assess Area of Impairment: Attention;Following commands;Problem solving                   Current Attention Level: Focused;Sustained   Following Commands: Follows one step commands inconsistently;Follows one step commands with increased time     Problem Solving: Slow processing;Decreased initiation;Difficulty sequencing;Requires verbal cues;Requires tactile cues        Exercises Other Exercises Other Exercises: PROM to L U and LE, A/resistive ROM to R U and LE    General Comments General comments (skin integrity, edema, etc.): vss      Pertinent Vitals/Pain Pain Assessment: Faces Faces Pain Scale: Hurts little more Pain Location: throat, head Pain Descriptors / Indicators: Discomfort Pain Intervention(s): Monitored during session    Home Living                      Prior Function            PT Goals (current goals can now be found in the care plan section) Acute Rehab PT Goals Patient Stated Goal: pt unable to state. PT Goal Formulation: Patient unable to participate in goal setting Time For Goal Achievement: 03/16/20 Potential to Achieve Goals: Fair  Progress towards PT goals: Progressing toward goals    Frequency    Min 3X/week      PT Plan Current plan remains appropriate    Co-evaluation              AM-PAC PT "6 Clicks" Mobility   Outcome Measure  Help needed turning from your back to your side while in a flat bed without using bedrails?: Total Help needed moving from lying on your back to sitting on the side of a flat bed without using bedrails?: Total Help needed moving to and from a bed to a chair (including a  wheelchair)?: Total Help needed standing up from a chair using your arms (e.g., wheelchair or bedside chair)?: Total Help needed to walk in hospital room?: Total Help needed climbing 3-5 steps with a railing? : Total 6 Click Score: 6    End of Session Equipment Utilized During Treatment: Oxygen Activity Tolerance: Patient tolerated treatment well Patient left: in bed;with call bell/phone within reach;with bed alarm set Nurse Communication: Mobility status PT Visit Diagnosis: Hemiplegia and hemiparesis Hemiplegia - Right/Left: Left Hemiplegia - dominant/non-dominant: Non-dominant Hemiplegia - caused by: Cerebral infarction     Time: 1856-3149 PT Time Calculation (min) (ACUTE ONLY): 38 min  Charges:  $Therapeutic Exercise: 8-22 mins $Therapeutic Activity: 8-22 mins $Neuromuscular Re-education: 8-22 mins                     03/05/2020  Jacinto Halim., PT Acute Rehabilitation Services [phone]  (pager) [phone]  (office)   [name] 03/05/2020, 6:45 PM

## 2020-03-05 NOTE — Progress Notes (Signed)
NAME:  Richard Farmer, MRN:  [id number], DOB:  November 09, 1940, LOS: 5 ADMISSION DATE:  02/26/2020, CONSULTATION DATE:  03/05/20 REFERRING MD:  Loreta Ave, CHIEF COMPLAINT:   L-sided weakness, CVA  Brief History   79 y.o. M of atrial fibrillation on coumadin, CAD, carotid stenosis, DM, HL and HTN who presented with L-sided weakness and found to have right ICA cervical occlusion s/p stenting right ICA and unsuccessful thrombectomy of right MCA/M1 and intracranial ICA.  Left intubated post procedure   Past Medical History   has a past medical history of Atrial fibrillation (HCC), CAD (coronary artery disease), Carotid artery disease (HCC), Diabetes mellitus (HCC), Gout, Hyperlipidemia, and Hypertension.   Significant Hospital Events   11/24 Admit to Neurology 11/24-cerebral angiography showing right ICA occlusion attempted thrombectomy with distal embolization, thrombectomy right MCA M1. 11/26-extubated  Consults:  PCCM  Procedures:  11/24 ETT >> 11/24 left radial aline >>  Significant Diagnostic Tests:  11/24 CTA head>> Age indeterminate occlusion of the right cervical ICA at its origin with reconstitution at the carotid terminus. The right MCA and ACA branches are opacified. 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  Multifocal moderate atherosclerotic stenoses, including the right A2 ACA, proximal right P2 PCA (in addition to severe distal stenosis), bilateral intradural vertebral arteries, and left vertebral artery origin.  11/24 CXR>> no acute infiltrate, ETT in good position  11/25 CT head : Acute right MCA territory infarct. Mild cerebral atrophy. Mild pansinus disease. Trace layering secretions may reflect. active inflammation.  11/25 MRI brain >> 1. Acute infarction affecting the right middle cerebral artery territory with sparing of the putamen, globus pallidus and caudate head. Region of cortical and subcortical infarction measures up to  11.5 cm. Minimal petechial blood products in the region of the infarction. No measurable hematoma. No midline shift. 2. Abnormal flow pattern in the right ICA at the base of the brain. There appears to be partial thrombosis or dissection.  11/25 TTE >> 1. Left ventricular ejection fraction, by estimation, is 60 to 65%. The left ventricle has normal function. The left ventricle has no regional wall motion abnormalities. There is mild left ventricular hypertrophy. Left ventricular diastolic parameters  are indeterminate.  2. Right ventricular systolic function is normal. The right ventricular size is mildly enlarged. There is mildly elevated pulmonary artery systolic pressure. The estimated right ventricular systolic pressure is 37.4 mmHg.  3. Left atrial size was moderately dilated.  4. Right atrial size was moderately dilated.  5. The mitral valve is normal in structure. Trivial mitral valve regurgitation. No evidence of mitral stenosis.  6. Tricuspid valve regurgitation is moderate.  7. The aortic valve is calcified. There is moderate calcification of the aortic valve. There is moderate thickening of the aortic valve. Aortic valve regurgitation is not visualized. Mild aortic valve stenosis. Aortic valve mean gradient measures 15.6  mmHg. Aortic valve Vmax measures 2.56 m/s.  8. The inferior vena cava is dilated in size with >50% respiratory variability, suggesting right atrial pressure of 8 mmHg.   11/26 CTH >> 1. 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 No hemorrhagic transformation or other complication. Localized edema without midline shift. 2. No other new acute intracranial abnormality.   Micro Data:  11/24 Covid-19 and Influenza>>negative 11/24 MRSA  >> neg  Antimicrobials:  n/a  Interim history/subjective:   More awake today,  readily follows commands.  Hypertonic saline discontinued  Objective    Blood pressure (!) 156/94, pulse 97, temperature 99.4 F (37.4 C), temperature source Axillary, resp. rate (!) 24, height 5\' 6"  (1.676 m), weight 87 kg, SpO2 100 %.        Intake/Output Summary (Last 24 hours) at 03/05/2020 0937 Last data filed at 03/05/2020 0700 Gross per 24 hour  Intake 1829.64 ml  Output 2000 ml  Net -170.36 ml   Filed Weights   02/07/2020 1403 03/03/20 0500 03/05/20 0500  Weight: 87 kg 87 kg 87 kg   General:  Elderly Caucasian male, lying on the bed HEENT: Atraumatic, normocephalic dry mucous membranes Neuro: Follows commands.  Attempts to open eyes.  Aphonic speech.  Normal strength right side.  Left hemiplegia hemineglect. CV: Irregularly irregular, no murmur PULM: Clear to auscultation bilaterally GI: soft, NT/ND, bs+ Extremities: warm/dry, no LE edema  Skin: no rashes  Resolved Hospital Problem list   Acute hypoxic respiratory failure due to stroke  Assessment & Plan:  Acute right MCA territory stroke, due to right ICA/right M1 occlusion status post attempted thrombectomy and stent placement in the right ICA Cerebral edema  Dysphagia due to acute stroke Induced hypernatremia -Monitor neurological exam now off 3% saline -Allow sodium to drift down -Protecting airway but will likely need feeding tube.  Aspiration pneumonia left lower lobe -Complete 7 days of Unasyn  Chronic atrial fibrillation Rate controlled Continue Coreg Continue to hold home coumadin for now, possibly restart at 7 days post stroke.   Hypertension on Coreg and losartan. -Have added hydrochlorothiazide for blood pressure control  Hypokalemia -Replete today  Diabetes type 2 Monitor fingerstick with goal 140-180 Continue sliding scale insulin  Best practice (evaluated daily)   Diet: NPO, continue tube feeds pain/Anxiety/Delirium protocol (if indicated): N/A VAP protocol (if indicated): HOB 30 degrees, suction as needed DVT prophylaxis: Subcu Lovenox GI prophylaxis:  Protonix (home medications) Glucose control: SSI Mobility: Bedrest Code Status: Full code Family communication: Patient's son and wife was updated over the phone Disposition: ICU, consider transfer to PCU exam remained stable or improves over the next 48 hours.  Labs   CBC: Recent Labs  Lab 02/22/2020 1317 02/06/2020 1321 03/01/20 0500 03/02/20 0432 03/03/20 0613 03/04/20 1040 03/05/20 0610  WBC 6.3   < > 7.9 9.6 11.0* 11.9* 11.7*  NEUTROABS 4.2  --   --   --   --   --   --   HGB 13.6   < > 12.6* 12.8* 13.7 13.5 13.9  HCT 42.1   < > 38.3* 38.4* 41.9 41.6 44.4  MCV 100.5*   < > 96.2 98.0 98.4 99.0 102.1*  PLT 161   < > 128* 126* 127* 126* 139*   < > = values in this interval not displayed.    Basic Metabolic Panel: Recent Labs  Lab 03/01/20 0500 03/01/20 0500 03/01/20 1632 03/02/20 0432 03/02/20 1037 03/02/20 1037 03/02/20 1642 03/02/20 2303 03/03/20 0613 03/03/20 1037 03/03/20 1651 03/03/20 2205 03/04/20 1040 03/04/20 1626 03/05/20 0610  NA 139   < >   < > 145 146*   < > 149*   < > 148*   < > 153* 155* 153* 155* 153*  K 3.7  --   --  3.3*  --   --   --   --  3.0*  --   --   --  3.6  --  3.4*  CL 109  --   --  112*  --   --   --   --  114*  --   --   --  115*  --  109  CO2 20*  --   --  20*  --   --   --   --  22  --   --   --  28  --  31  GLUCOSE 166*  --   --  132*  --   --   --   --  293*  --   --   --  223*  --  183*  BUN 26*  --   --  21  --   --   --   --  18  --   --   --  26*  --  33*  CREATININE 1.03  --   --  0.90  --   --   --   --  0.93  --   --   --  1.09  --  1.09  CALCIUM 8.5*  --   --  8.7*  --   --   --   --  9.1  --   --   --  8.9  --  9.2  MG 1.6*   < >  --   --  2.1  --   --   --  2.1  --  2.0  --  1.7 1.9  --   PHOS  --   --   --   --   --   --  1.8*  --  1.8*  --  1.8*  --  2.4*  --   --    < > = values in this interval not displayed.   GFR: Estimated Creatinine Clearance: 55.9 mL/min (by C-G formula based on SCr of 1.09 mg/dL). Recent Labs   Lab 03/02/20 0432 03/03/20 0613 03/04/20 1040 03/05/20 0610  WBC 9.6 11.0* 11.9* 11.7*    Liver Function Tests: Recent Labs  Lab 2020-03-08 1317 03/01/20 0500  AST 21 17  ALT 23 19  ALKPHOS 82 67  BILITOT 1.3* 1.4*  PROT 6.3* 5.1*  ALBUMIN 3.4* 3.1*   No results for input(s): LIPASE, AMYLASE in the last 168 hours. No results for input(s): AMMONIA in the last 168 hours.  ABG    Component Value Date/Time   PHART 7.412 03-08-20 2118   PCO2ART 37.5 2020-03-08 2118   PO2ART 195 (H) Mar 08, 2020 2118   HCO3 23.8 March 08, 2020 2118   TCO2 25 2020-03-08 2118   ACIDBASEDEF 1.0 03/08/2020 2118   O2SAT 100.0 2020/03/08 2118     Coagulation Profile: Recent Labs  Lab 03-08-20 1317 03/01/20 0500  INR 1.4* 1.5*    Cardiac Enzymes: No results for input(s): CKTOTAL, CKMB, CKMBINDEX, TROPONINI in the last 168 hours.  HbA1C: Hgb A1c MFr Bld  Date/Time Value Ref Range Status  03/01/2020 05:00 AM 6.3 (H) 4.8 - 5.6 % Final    Comment:    (NOTE) Pre diabetes:          5.7%-6.4%  Diabetes:              >6.4%  Glycemic control for   <7.0% adults with diabetes     CBG: Recent Labs  Lab 03/04/20 1645 03/04/20 1925 03/04/20 2308 03/05/20 0310 03/05/20 0814  GLUCAP 132* 152* 182* 218* 186*     Lynnell Catalan, MD [facility] ICU Physician [facility]  Pager: [phone] Or Epic Secure Chat After hours: [phone].  03/05/2020, 9:46 AM

## 2020-03-06 DIAGNOSIS — I63429 Cerebral infarction due to embolism of unspecified anterior cerebral artery: Secondary | ICD-10-CM | POA: Diagnosis not present

## 2020-03-06 DIAGNOSIS — J9621 Acute and chronic respiratory failure with hypoxia: Secondary | ICD-10-CM

## 2020-03-06 DIAGNOSIS — J988 Other specified respiratory disorders: Secondary | ICD-10-CM | POA: Diagnosis not present

## 2020-03-06 DIAGNOSIS — G936 Cerebral edema: Secondary | ICD-10-CM | POA: Diagnosis not present

## 2020-03-06 DIAGNOSIS — J9622 Acute and chronic respiratory failure with hypercapnia: Secondary | ICD-10-CM

## 2020-03-06 DIAGNOSIS — I6521 Occlusion and stenosis of right carotid artery: Secondary | ICD-10-CM

## 2020-03-06 DIAGNOSIS — R509 Fever, unspecified: Secondary | ICD-10-CM | POA: Diagnosis not present

## 2020-03-06 DIAGNOSIS — I63511 Cerebral infarction due to unspecified occlusion or stenosis of right middle cerebral artery: Secondary | ICD-10-CM | POA: Diagnosis not present

## 2020-03-06 LAB — BASIC METABOLIC PANEL
Anion gap: 12 (ref 5–15)
BUN: 35 mg/dL — ABNORMAL HIGH (ref 8–23)
CO2: 31 mmol/L (ref 22–32)
Calcium: 9.2 mg/dL (ref 8.9–10.3)
Chloride: 110 mmol/L (ref 98–111)
Creatinine, Ser: 0.97 mg/dL (ref 0.61–1.24)
GFR, Estimated: >60 mL/min (ref >60)
Glucose, Bld: 248 mg/dL — ABNORMAL HIGH (ref 70–99)
Potassium: 3.7 mmol/L (ref 3.5–5.1)
Sodium: 153 mmol/L — ABNORMAL HIGH (ref 135–145)

## 2020-03-06 LAB — CBC
HCT: 44.7 % (ref 39.0–52.0)
Hemoglobin: 13.9 g/dL (ref 13.0–17.0)
MCH: 31.8 pg (ref 26.0–34.0)
MCHC: 31.1 g/dL (ref 30.0–36.0)
MCV: 102.3 fL — ABNORMAL HIGH (ref 80.0–100.0)
Platelets: 127 10*3/uL — ABNORMAL LOW (ref 150–400)
RBC: 4.37 MIL/uL (ref 4.22–5.81)
RDW: 14.2 % (ref 11.5–15.5)
WBC: 9.2 10*3/uL (ref 4.0–10.5)
nRBC: 0 % (ref 0.0–0.2)

## 2020-03-06 LAB — GLUCOSE, CAPILLARY
Glucose-Capillary: 215 mg/dL — ABNORMAL HIGH (ref 70–99)
Glucose-Capillary: 225 mg/dL — ABNORMAL HIGH (ref 70–99)
Glucose-Capillary: 155 mg/dL — ABNORMAL HIGH (ref 70–99)
Glucose-Capillary: 228 mg/dL — ABNORMAL HIGH (ref 70–99)
Glucose-Capillary: 258 mg/dL — ABNORMAL HIGH (ref 70–99)
Glucose-Capillary: 167 mg/dL — ABNORMAL HIGH (ref 70–99)

## 2020-03-06 LAB — TRIGLYCERIDES: Triglycerides: 109 mg/dL (ref <150)

## 2020-03-06 LAB — CULTURE, RESPIRATORY W GRAM STAIN
Culture: NORMAL
Gram Stain: NONE SEEN

## 2020-03-06 MED ORDER — SODIUM CHLORIDE 0.9 % IV SOLN: INTRAVENOUS | Status: DC | PRN

## 2020-03-06 NOTE — Progress Notes (Signed)
NAME:  Richard Farmer, MRN:  [id number], DOB:  12/24/1940, LOS: 6 ADMISSION DATE:  02/15/2020, CONSULTATION DATE:  03/06/20 REFERRING MD:  Loreta Ave, CHIEF COMPLAINT:   L-sided weakness, CVA  Brief History   79 y.o. M of atrial fibrillation on coumadin, CAD, carotid stenosis, DM, HL and HTN who presented with L-sided weakness and found to have right ICA cervical occlusion s/p stenting right ICA and unsuccessful thrombectomy of right MCA/M1 and intracranial ICA.  Left intubated post procedure   Past Medical History   has a past medical history of Atrial fibrillation (HCC), CAD (coronary artery disease), Carotid artery disease (HCC), Diabetes mellitus (HCC), Gout, Hyperlipidemia, and Hypertension.   Significant Hospital Events   11/24 Admit to Neurology 11/24-cerebral angiography showing right ICA occlusion attempted thrombectomy with distal embolization, thrombectomy right MCA M1. 11/26-extubated  Consults:  PCCM  Procedures:  11/24 ETT >> 11/24 left radial aline >>  Significant Diagnostic Tests:  11/24 CTA head>> Age indeterminate occlusion of the right cervical ICA at its origin with reconstitution at the carotid terminus. The right MCA and ACA branches are opacified. 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  Multifocal moderate atherosclerotic stenoses, including the right A2 ACA, proximal right P2 PCA (in addition to severe distal stenosis), bilateral intradural vertebral arteries, and left vertebral artery origin.  11/24 CXR>> no acute infiltrate, ETT in good position  11/25 CT head : Acute right MCA territory infarct. Mild cerebral atrophy. Mild pansinus disease. Trace layering secretions may reflect. active inflammation.  11/25 MRI brain >> 1. Acute infarction affecting the right middle cerebral artery territory with sparing of the putamen, globus pallidus and caudate head. Region of cortical and subcortical infarction measures up to  11.5 cm. Minimal petechial blood products in the region of the infarction. No measurable hematoma. No midline shift. 2. Abnormal flow pattern in the right ICA at the base of the brain. There appears to be partial thrombosis or dissection.  11/25 TTE >> 1. Left ventricular ejection fraction, by estimation, is 60 to 65%. The left ventricle has normal function. The left ventricle has no regional wall motion abnormalities. There is mild left ventricular hypertrophy. Left ventricular diastolic parameters  are indeterminate.  2. Right ventricular systolic function is normal. The right ventricular size is mildly enlarged. There is mildly elevated pulmonary artery systolic pressure. The estimated right ventricular systolic pressure is 37.4 mmHg.  3. Left atrial size was moderately dilated.  4. Right atrial size was moderately dilated.  5. The mitral valve is normal in structure. Trivial mitral valve regurgitation. No evidence of mitral stenosis.  6. Tricuspid valve regurgitation is moderate.  7. The aortic valve is calcified. There is moderate calcification of the aortic valve. There is moderate thickening of the aortic valve. Aortic valve regurgitation is not visualized. Mild aortic valve stenosis. Aortic valve mean gradient measures 15.6  mmHg. Aortic valve Vmax measures 2.56 m/s.  8. The inferior vena cava is dilated in size with >50% respiratory variability, suggesting right atrial pressure of 8 mmHg.   11/26 CTH >> 1. 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 No hemorrhagic transformation or other complication. Localized edema without midline shift. 2. No other new acute intracranial abnormality.   Micro Data:  11/24 Covid-19 and Influenza>>negative 11/24 MRSA  >> neg  Antimicrobials:  n/a  Interim history/subjective:   Patient sitting up with physiotherapy.  Requiring full assist.  Appropriately  interactive.  Objective   Blood pressure 106/73, pulse (!) 102, temperature (!) 97.5 F (36.4 C), temperature source Axillary, resp. rate (!) 21, height 5\' 6"  (1.676 m), weight 87.1 kg, SpO2 96 %.        Intake/Output Summary (Last 24 hours) at 03/06/2020 1305 Last data filed at 03/06/2020 1200 Gross per 24 hour  Intake 825 ml  Output 1500 ml  Net -675 ml   Filed Weights   03/03/20 0500 03/05/20 0500 03/06/20 0500  Weight: 87 kg 87 kg 87.1 kg   General:  Elderly male, lying on the bed HEENT: Atraumatic, normocephalic dry mucous membranes Neuro: Follows commands.  Attempts to open eyes.  Aphonic speech.  Normal strength right side.  Left hemiplegia hemineglect. CV: Irregularly irregular, no murmur PULM: Clear to auscultation bilaterally.  No upper airway transmitted sounds. GI: soft, NT/ND, bs+ Extremities: warm/dry, no LE edema  Skin: no rashes  Resolved Hospital Problem list   Acute hypoxic respiratory failure due to stroke  Assessment & Plan:  Acute right MCA territory stroke, due to right ICA/right M1 occlusion status post attempted thrombectomy and stent placement in the right ICA Cerebral edema  Dysphagia due to acute stroke Induced hypernatremia -Monitor neurological exam now off 3% saline -Allow sodium to drift down -Protecting airway successfully now 4 days post extubation  Aspiration pneumonia left lower lobe -Complete 7 days of Unasyn  Chronic atrial fibrillation Rate controlled Continue Coreg Continue to hold home coumadin for now, possibly restart at 7 days post stroke.   Hypertension on Coreg and losartan. -Have added hydrochlorothiazide for blood pressure control  Hypokalemia -Replete today  Diabetes type 2 Monitor fingerstick with goal 140-180 Continue sliding scale insulin  Best practice (evaluated daily)   Diet: NPO, continue tube feeds pain/Anxiety/Delirium protocol (if indicated): N/A VAP protocol (if indicated): HOB 30 degrees, suction  as needed DVT prophylaxis: Subcu Lovenox GI prophylaxis: Protonix (home medications) Glucose control: SSI Mobility: Bedrest Code Status: Full code Family communication: Patient's son and wife was updated over the phone Disposition: Ready for transfer to PCU.  CCM will sign off.  Labs   CBC: Recent Labs  Lab 03/27/2020 1317 03-27-2020 1321 03/02/20 0432 03/03/20 0613 03/04/20 1040 03/05/20 0610 03/06/20 0704  WBC 6.3   < > 9.6 11.0* 11.9* 11.7* 9.2  NEUTROABS 4.2  --   --   --   --   --   --   HGB 13.6   < > 12.8* 13.7 13.5 13.9 13.9  HCT 42.1   < > 38.4* 41.9 41.6 44.4 44.7  MCV 100.5*   < > 98.0 98.4 99.0 102.1* 102.3*  PLT 161   < > 126* 127* 126* 139* 127*   < > = values in this interval not displayed.    Basic Metabolic Panel: Recent Labs  Lab 03/01/20 0500 03/02/20 0432 03/02/20 1037 03/02/20 1037 03/02/20 1642 03/02/20 2303 03/03/20 0613 03/03/20 1037 03/03/20 1651 03/03/20 1651 03/03/20 2205 03/04/20 1040 03/04/20 1626 03/05/20 0610 03/06/20 0704  NA   < > 145 146*   < > 149*   < > 148*   < > 153*   < > 155* 153* 155* 153* 153*  K  --  3.3*  --   --   --   --  3.0*  --   --   --   --  3.6  --  3.4* 3.7  CL  --  112*  --   --   --   --  114*  --   --   --   --  115*  --  109 110  CO2  --  20*  --   --   --   --  22  --   --   --   --  28  --  31 31  GLUCOSE  --  132*  --   --   --   --  293*  --   --   --   --  223*  --  183* 248*  BUN  --  21  --   --   --   --  18  --   --   --   --  26*  --  33* 35*  CREATININE  --  0.90  --   --   --   --  0.93  --   --   --   --  1.09  --  1.09 0.97  CALCIUM  --  8.7*  --   --   --   --  9.1  --   --   --   --  8.9  --  9.2 9.2  MG   < >  --  2.1  --   --   --  2.1  --  2.0  --   --  1.7 1.9  --   --   PHOS  --   --   --   --  1.8*  --  1.8*  --  1.8*  --   --  2.4*  --   --   --    < > = values in this interval not displayed.   GFR: Estimated Creatinine Clearance: 62.8 mL/min (by C-G formula based on SCr of 0.97  mg/dL). Recent Labs  Lab 03/03/20 0613 03/04/20 1040 03/05/20 0610 03/06/20 0704  WBC 11.0* 11.9* 11.7* 9.2    Liver Function Tests: Recent Labs  Lab 02/12/2020 1317 03/01/20 0500  AST 21 17  ALT 23 19  ALKPHOS 82 67  BILITOT 1.3* 1.4*  PROT 6.3* 5.1*  ALBUMIN 3.4* 3.1*   No results for input(s): LIPASE, AMYLASE in the last 168 hours. No results for input(s): AMMONIA in the last 168 hours.  ABG    Component Value Date/Time   PHART 7.412 02/24/2020 2118   PCO2ART 37.5 02/06/2020 2118   PO2ART 195 (H) 02/19/2020 2118   HCO3 23.8 02/09/2020 2118   TCO2 25 02/07/2020 2118   ACIDBASEDEF 1.0 02/06/2020 2118   O2SAT 100.0 02/17/2020 2118     Coagulation Profile: Recent Labs  Lab 02/26/2020 1317 03/01/20 0500  INR 1.4* 1.5*    Cardiac Enzymes: No results for input(s): CKTOTAL, CKMB, CKMBINDEX, TROPONINI in the last 168 hours.  HbA1C: Hgb A1c MFr Bld  Date/Time Value Ref Range Status  03/01/2020 05:00 AM 6.3 (H) 4.8 - 5.6 % Final    Comment:    (NOTE) Pre diabetes:          5.7%-6.4%  Diabetes:              >6.4%  Glycemic control for   <7.0% adults with diabetes     CBG: Recent Labs  Lab 03/05/20 1918 03/05/20 2301 03/06/20 0311 03/06/20 0729 03/06/20 1121  GLUCAP 213* 193* 215* 225* 155*     Lynnell Catalan, MD [facility] ICU Physician [facility]  Pager: [phone] Or Epic Secure Chat After hours: [phone].  03/06/2020, 1:05 PM

## 2020-03-06 NOTE — Progress Notes (Signed)
Physical Therapy Treatment Patient Details Name: Richard Farmer MRN: [id number] DOB: 05-18-1940 Today's Date: 03/06/2020    History of Present Illness This 79 y.o. male admitted with acute onset Lt sided weakness.  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  CT of head showed large Rt MCA territory infarct, no hemorrhagic transformation, localized edema.  HTN, Gout, DM, CAD, A-Fib, s/p Rt TKA,     PT Comments    PT and OT saw pt in conjunction given severity of deficits and for mobility progression. Pt continues to require total +2 assist for bed mobility and transfers, pt limited this day by fatigue. Pt requires max multimodal cuing for eye opening and interaction with PT/OT today. Pt tolerated EOB sitting and challenges to seated balance with mod-max posterior assist, no righting reactions observed when tested. PT to continue to follow acutely.   BP supine:105/75 BP sitting 0 min: 89/73 with + dizziness BP sitting 3 min: 103/76   Follow Up Recommendations  CIR;SNF;Other (comment)     Equipment Recommendations  Other (comment);Wheelchair (measurements PT);Wheelchair cushion (measurements PT) (TBD)    Recommendations for Other Services       Precautions / Restrictions Precautions Precautions: Fall Restrictions Weight Bearing Restrictions: No    Mobility  Bed Mobility Overal bed mobility: Needs Assistance Bed Mobility: Rolling;Sidelying to Sit;Supine to Sit Rolling: Total assist Sidelying to sit: Total assist;+2 for physical assistance Supine to sit: Total assist;+2 for physical assistance     General bed mobility comments: total +2 for trunk and LE management, scooting to and from EOB, pericare.  Transfers Overall transfer level: Needs assistance   Transfers: Sit to/from Stand Sit to Stand: Total assist;+2 physical assistance         General transfer comment: total +2 for power up and  steadying with use of bed pad, pt unable to come to full standing this day.  Ambulation/Gait             General Gait Details: NT   Stairs             Wheelchair Mobility    Modified Rankin (Stroke Patients Only) Modified Rankin (Stroke Patients Only) Pre-Morbid Rankin Score: No symptoms Modified Rankin: Severe disability     Balance Overall balance assessment: Needs assistance Sitting-balance support: Single extremity supported;Feet supported Sitting balance-Leahy Scale: Zero Sitting balance - Comments: EOB sitting x15 minutes, no observed righting reactions posteriorly or laterally when challenged, able to prop on R elbow briefly, can pull forward with mod assist with trunk is brought posteriorly. Requires mod-max posterior assist for static sitting     Standing balance-Leahy Scale: Zero                              Cognition Arousal/Alertness: Awake/alert;Lethargic Behavior During Therapy: Flat affect Overall Cognitive Status: Difficult to assess Area of Impairment: Attention;Following commands;Problem solving;Orientation                 Orientation Level: Place;Person;Situation Current Attention Level: Focused;Sustained   Following Commands: Follows one step commands inconsistently;Follows one step commands with increased time     Problem Solving: Slow processing;Decreased initiation;Difficulty sequencing;Requires verbal cues;Requires tactile cues General Comments: Pt keeps eyes closed most of session, requires max cuing to keep eyes open. Oriented to place and situation when given options      Exercises      General Comments  Pertinent Vitals/Pain Pain Assessment: Faces Faces Pain Scale: Hurts a little bit Pain Location: gestures to abdominal region Pain Descriptors / Indicators: Discomfort Pain Intervention(s): Limited activity within patient's tolerance;Monitored during session    Home Living                       Prior Function            PT Goals (current goals can now be found in the care plan section) Acute Rehab PT Goals Patient Stated Goal: pt unable to state. PT Goal Formulation: Patient unable to participate in goal setting Time For Goal Achievement: 03/16/20 Potential to Achieve Goals: Fair Progress towards PT goals: Progressing toward goals    Frequency    Min 3X/week      PT Plan Current plan remains appropriate    Co-evaluation PT/OT/SLP Co-Evaluation/Treatment: Yes Reason for Co-Treatment: To address functional/ADL transfers;For patient/therapist safety PT goals addressed during session: Mobility/safety with mobility;Balance;Strengthening/ROM        AM-PAC PT "6 Clicks" Mobility   Outcome Measure  Help needed turning from your back to your side while in a flat bed without using bedrails?: Total Help needed moving from lying on your back to sitting on the side of a flat bed without using bedrails?: Total Help needed moving to and from a bed to a chair (including a wheelchair)?: Total Help needed standing up from a chair using your arms (Farmer.g., wheelchair or bedside chair)?: Total Help needed to walk in hospital room?: Total Help needed climbing 3-5 steps with a railing? : Total 6 Click Score: 6    End of Session Equipment Utilized During Treatment: Oxygen;Gait belt (2LO2) Activity Tolerance: Patient limited by fatigue Patient left: in bed;with call bell/phone within reach;with bed alarm set;with family/visitor present;with SCD's reapplied Nurse Communication: Mobility status PT Visit Diagnosis: Hemiplegia and hemiparesis Hemiplegia - Right/Left: Left Hemiplegia - dominant/non-dominant: Non-dominant Hemiplegia - caused by: Cerebral infarction     Time: 1245-8099 PT Time Calculation (min) (ACUTE ONLY): 31 min  Charges:  $Neuromuscular Re-education: 8-22 mins                    Richard Farmer, PT Acute Rehabilitation Services Pager [phone]  Office  [phone]    Richard Farmer 03/06/2020, 1:55 PM

## 2020-03-06 NOTE — Progress Notes (Signed)
Occupational Therapy Treatment Patient Details Name: Richard Farmer MRN: [id number] DOB: [birth year] Today's Date: 03/06/2020    History of present illness This 79 y.o. male admitted with acute onset Lt sided weakness.  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  CT of head showed large Rt MCA territory infarct, no hemorrhagic transformation, localized edema.  HTN, Gout, DM, CAD, A-Fib, s/p Rt TKA,    OT comments  Pt with gradual progress towards OT goals, agreeable to working with therapies. Pt tolerating sitting EOB approx 15 min. He continues to require two person assist for all aspects of mobility and at least maxA for static balance EOB. Pt requiring max cues for opening eyes and to maintain eyes open; engaging in simple grooming ADL with maxA throughout. Pt does nod head (appears appropriate) throughout and following majority of simple commands. Pt with hypotension initially seated EOB (see below), which improved with prolonged time upright. Will continue per POC at this time.  BP supine: 105/75 Seated EOB: 89/73 Sitting EOB approx 6 min: 103/76 End of session: 106/73   Follow Up Recommendations  CIR;SNF;Supervision/Assistance - 24 hour (pending progress )    Equipment Recommendations  Other (comment) (TBD)          Precautions / Restrictions Precautions Precautions: Fall Restrictions Weight Bearing Restrictions: No       Mobility Bed Mobility Overal bed mobility: Needs Assistance Bed Mobility: Rolling;Sidelying to Sit;Supine to Sit Rolling: Total assist Sidelying to sit: Total assist;+2 for physical assistance Supine to sit: Total assist;+2 for physical assistance     General bed mobility comments: total +2 for trunk and LE management, scooting to and from EOB, pericare.  Transfers Overall transfer level: Needs assistance Equipment used: 2 person hand held assist Transfers: Sit to/from  Stand Sit to Stand: Total assist;+2 physical assistance         General transfer comment: total +2 for power up and steadying with use of bed pad, pt unable to come to full standing this day.    Balance Overall balance assessment: Needs assistance Sitting-balance support: Single extremity supported;Feet supported Sitting balance-Leahy Scale: Zero Sitting balance - Comments: EOB sitting x15 minutes, no observed righting reactions posteriorly or laterally when challenged, able to prop on R elbow briefly, can pull forward with mod assist with trunk is brought posteriorly. Requires mod-max posterior assist for static sitting     Standing balance-Leahy Scale: Zero                             ADL either performed or assessed with clinical judgement   ADL Overall ADL's : Needs assistance/impaired     Grooming: Brushing hair;Maximal assistance;Sitting Grooming Details (indicate cue type and reason): seated EOB; assist to initially grasp comb with support provided at elbow to bring hand to head for combing hair; requires assist for thoroughness as pt fatigues easily                              Functional mobility during ADLs: Maximal assistance;Total assistance;+2 for physical assistance;+2 for safety/equipment (bed mobility and sit<>stand attempts )       Vision   Additional Comments: max cues to open eyes, pt unable to maintain open for long period of time    Perception     Praxis      Cognition  Arousal/Alertness: Awake/alert;Lethargic Behavior During Therapy: Flat affect Overall Cognitive Status: Difficult to assess Area of Impairment: Attention;Following commands;Problem solving;Orientation                 Orientation Level: Place;Person;Situation Current Attention Level: Focused;Sustained   Following Commands: Follows one step commands inconsistently;Follows one step commands with increased time     Problem Solving: Slow  processing;Decreased initiation;Difficulty sequencing;Requires verbal cues;Requires tactile cues General Comments: Pt keeps eyes closed most of session, requires max cuing to keep eyes open. Oriented to place and situation when given options        Exercises     Shoulder Instructions       General Comments      Pertinent Vitals/ Pain       Pain Assessment: Faces Faces Pain Scale: Hurts a little bit Pain Location: gestures to abdominal region Pain Descriptors / Indicators: Discomfort Pain Intervention(s): Monitored during session  Home Living                                          Prior Functioning/Environment              Frequency  Min 2X/week        Progress Toward Goals  OT Goals(current goals can now be found in the care plan section)  Progress towards OT goals: Progressing toward goals  Acute Rehab OT Goals Patient Stated Goal: pt unable to state. OT Goal Formulation: With patient Time For Goal Achievement: 03/16/20 Potential to Achieve Goals: Good  Plan Discharge plan remains appropriate    Co-evaluation    PT/OT/SLP Co-Evaluation/Treatment: Yes Reason for Co-Treatment: For patient/therapist safety;To address functional/ADL transfers PT goals addressed during session: Mobility/safety with mobility;Balance;Strengthening/ROM OT goals addressed during session: ADL's and self-care      AM-PAC OT "6 Clicks" Daily Activity     Outcome Measure   Help from another person eating meals?: Total Help from another person taking care of personal grooming?: Total Help from another person toileting, which includes using toliet, bedpan, or urinal?: Total Help from another person bathing (including washing, rinsing, drying)?: Total Help from another person to put on and taking off regular upper body clothing?: Total Help from another person to put on and taking off regular lower body clothing?: Total 6 Click Score: 6    End of Session  Equipment Utilized During Treatment: Oxygen  OT Visit Diagnosis: Hemiplegia and hemiparesis Hemiplegia - Right/Left: Left Hemiplegia - dominant/non-dominant: Non-Dominant Hemiplegia - caused by: Cerebral infarction   Activity Tolerance Patient tolerated treatment well   Patient Left in bed;with call bell/phone within reach;with bed alarm set;with restraints reapplied;with family/visitor present   Nurse Communication Mobility status        Time: 6503-5465 OT Time Calculation (min): 31 min  Charges: OT General Charges $OT Visit: 1 Visit OT Treatments $Self Care/Home Management : 8-22 mins  Marcy Siren, OT Acute Rehabilitation Services Pager [phone] Office [phone]    Orlando Penner 03/06/2020, 3:43 PM

## 2020-03-06 NOTE — Progress Notes (Signed)
STROKE TEAM PROGRESS NOTE   INTERVAL HISTORY RN and son are at the bedside. Pt lying in bed, still lethargic, attempted to open eyes to voice. However, still orientated and following simple commands. Neuro stable but still has difficulty clearing secretions although also stable. Discussed with son, pt has no living wills but family would like to discuss with palliative care to explore options if pt not getting better with no quality of life down the road.    Vitals:   03/06/20 0500 03/06/20 0600 03/06/20 0700 03/06/20 0800  BP: (!) 154/92 (!) 154/94 111/77 (!) 159/80  Pulse: (!) 101 91 86 96  Resp: (!) 22 (!) 21 (!) 21 20  Temp:    98.5 F (36.9 C)  TempSrc:    Axillary  SpO2: 100% 100% 100% 100%  Weight: 87.1 kg     Height:       CBC:  Recent Labs  Lab 02/10/2020 1317 02/17/2020 1321 03/05/20 0610 03/06/20 0704  WBC 6.3   < > 11.7* 9.2  NEUTROABS 4.2  --   --   --   HGB 13.6   < > 13.9 13.9  HCT 42.1   < > 44.4 44.7  MCV 100.5*   < > 102.1* 102.3*  PLT 161   < > 139* 127*   < > = values in this interval not displayed.   Basic Metabolic Panel:  Recent Labs  Lab 03/03/20 1651 03/03/20 2205 03/04/20 1040 03/04/20 1040 03/04/20 1626 03/05/20 0610 03/06/20 0704  NA 153*   < > 153*   < > 155* 153* 153*  K  --   --  3.6   < >  --  3.4* 3.7  CL  --   --  115*   < >  --  109 110  CO2  --   --  28   < >  --  31 31  GLUCOSE  --   --  223*   < >  --  183* 248*  BUN  --   --  26*   < >  --  33* 35*  CREATININE  --   --  1.09   < >  --  1.09 0.97  CALCIUM  --   --  8.9   < >  --  9.2 9.2  MG 2.0  --  1.7  --  1.9  --   --   PHOS 1.8*  --  2.4*  --   --   --   --    < > = values in this interval not displayed.   Lipid Panel:  Recent Labs  Lab 03/02/20 0432 03/03/20 0613 03/06/20 0704  CHOL 96  --   --   TRIG 132   < > 109  HDL 28*  --   --   CHOLHDL 3.4  --   --   VLDL 26  --   --   LDLCALC 42  --   --    < > = values in this interval not displayed.   HgbA1c:   Recent Labs  Lab 03/01/20 0500  HGBA1C 6.3*   Urine Drug Screen: No results for input(s): LABOPIA, COCAINSCRNUR, LABBENZ, AMPHETMU, THCU, LABBARB in the last 168 hours.  Alcohol Level No results for input(s): ETH in the last 168 hours.  IMAGING past 24 hours No results found.   PHYSICAL EXAM    Temp:  [98.3 F (36.8 C)-99.3 F (37.4 C)] 98.5 F (36.9  C) (11/30 0800) Pulse Rate:  [86-103] 96 (11/30 0800) Resp:  [19-26] 20 (11/30 0800) BP: (90-159)/(51-94) 159/80 (11/30 0800) SpO2:  [99 %-100 %] 100 % (11/30 0800) Weight:  [87.1 kg] 87.1 kg (11/30 0500)  General - Well nourished, well developed, lethargic, intermittent cough with oral secreation.  Ophthalmologic - fundi not visualized due to noncooperation.  Cardiovascular - irregularly irregular heart rate and rhythm.  Neuro - attempt to open eyes with voice but not able to open, lethargic, able to follow simple commands on the right and midline. Nonverbal though. With forced eye opening, pt not blinking to visual threat bilaterally, right gaze preference, not able to cross midline. Able to track on the right with eye movement to the right. PERRL. Mild left facial weakness.  Tongue protrusion not cooperative.  Motor system exam shows spontaneous purposeful right upper extremity movements barely against gravity, RLE withdraw 3-/5 to pain and moving toes PF/DF on request.  Left upper and lower extremity hypotonic and slight withdraw respond to painful stimulus. No babinski bilaterally.  Gait not tested   ASSESSMENT/PLAN Richard Farmer is a 79 y.o. male with history of AF on warfarin, know ICA stenosis presenting with L sided weakness and hemianopsia. He did not receive tPA.  Taken to IR for emergent stenting of R ICA occlusion.  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 Large  vessel disease  CT head no acute abnormality. Possible R forehead scalp hematoma/contusion  CTA head & neck R cervical ICA origin occlusion with reconstitution at terminus. Critical stenosis brachiocephalic artery. Severe stenosis R P2. Multifocal moderate R A2, proximal R P2, severe distal R P2, B mid VA, L VA origin stenoses. B ICA atherosclerosis w/ L ICA web. Severe degenerative C6-7 disc dz.  CT perfusion large penumbra  Cerebral angio / IR - R ICA occlusion from bulb to proximal ICA terminus. Stent R ICA. ICA remains occluded after ICA stent, secondary to occlusion at the supraclinoid segment. Right M1 remains occluded, embolization to new territory.  MRI  R MCA infarct w/ sparing of putamen, globus pallidus and caudate head. Petechial hemorrhage. No shift. Abnormal flor R ICA w/ partial dissection.  CT Head 11/28 - Continued interval evolution of subacute right MCA territory infarct, stable in size and distribution from previous. Slightly worsened localized edema and partial effacement of the right lateral ventricle, but with no more than trace right-to-left midline shift.  2D Echo EF 60-65%. No source of embolus. LA moderately dilated  LDL 42     HgbA1c 6.3  VTE prophylaxis - Lovenox 40 mg sq daily   aspirin 81 mg daily and warfarin daily prior to admission w/ INR 1.4 on arrival, now on aspirin 81 mg daily and clopidogrel 75 mg daily.   Therapy recommendations:  CIR vs SNF  Disposition:  pending   Palliative care consulted for goals of care per family request  Acute Respiratory Failure  Secondary to stroke  Intubated for IR, extubated 03/02/2020  Continue to have difficulty clearing secretions  Close monitoring respiratory status  CCM on board   Cytotoxic cerebral edema Induced Hypernatremia  MRI large right MCA infarct with cytotoxic edema and effacement of right lateral ventricle  On 3% at 75cc -> 50cc / hr->off   Goal Na 145-155  Na  145->146->149->150->148->153->155->153->153  CT 11/28 - Continued interval evolution of  subacute right MCA territory infarct, stable in size and distribution from previous. Slightly worsened localized edema and partial effacement of the right lateral ventricle, but with no more than trace right-to-left midline shift.  Chronic Atrial Fibrillation on Coumadin with subtherapeutic INR  Home anticoagulation:  warfarin daily + asa 81  Admission INR 1.4 -> 1.5  On DAPT now  Not an AC candidate at this time d/t large stroke sites and risk of hemorrhagic transformation    Consider resume anticoagulation in 10 to 14 days post stroke  Aspiration pneumonia LLL  Fever T-max 100.6->100.1->afebrile  Leukocytosis WBC 11.9->11.7->9.2  Extubated 11/26 but still have copious secretions  CXR small left pleural effusion and infiltration or atelectasis in the left base  On Unasyn 11/27>>  Resp Cx 11/28 reincubated  CCM on board  NT suctioning as needed  On 3% neb and Chest PT  Hypertension  Home meds:  Coreg 12.5 bid, losartan 50, Maxzide . BP stable on the low end . Resume Coreg 12.5->25 bid  . Resume losartan 50->100 . D/c HCTZ 12.5  . Long-term BP goal normotensive  Hyperlipidemia  Home meds:  lipitor 80 + omega 3  LDL 42, goal < 70  On Lipitor 40  Continue statin on discharge  Diabetes type II Controlled  Home meds - amaryl  HgbA1c 6.3, goal < 7.0  CBGs  SSI  On Lantus 15->20 U daily  Glucose fluctuate  PT follow-up  Dysphagia . Secondary to stroke . NPO . Cortrak on TF @ 65 . Speech on board   Tobacco abuse  Current smoker  Smoking cessation counseling will be provided  Pt is willing to quit  Other Stroke Risk Factors  Advanced Age >/= 65   ETOH use 4x wk  Obesity, Body mass index is 30.99 kg/m., BMI >/= 30 associated with increased stroke risk, recommend weight loss, diet and exercise as appropriate   Family hx stroke (father)  Coronary  artery disease s/p stent  Other Active Problems  Gout on allopurinal 300 daily  GERD on prilosec  BPH on flomax  Hypokalemia K 2.4->4.4->3.7->3.3->3.0->3.6->3.4->3.7  Mild thrombocytopenia - platelets - 161->127->126->139->127  Hospital day # 6  This patient is critically ill due to large right MCA infarct, cerebral edema, respiratory failure, aspiration pneumonia and at significant risk of neurological worsening, death form brain herniation, recurrent stroke, hemorrhagic conversion, sepsis, heart failure, respiratory failure. This patient's care requires constant monitoring of vital signs, hemodynamics, respiratory and cardiac monitoring, review of multiple databases, neurological assessment, discussion with family, other specialists and medical decision making of high complexity. I spent 40 minutes of neurocritical care time in the care of this patient. I had long discussion with Son at bedside, updated pt current condition, treatment plan and potential prognosis, and answered all the questions. He expressed understanding and appreciation. I also discussed with Dr. Denese Killings.   Marvel Plan, MD PhD Stroke Neurology 03/06/2020 9:12 AM   To contact Stroke Continuity provider, please refer to [email or web address]. After hours, contact General Neurology

## 2020-03-07 DIAGNOSIS — Z515 Encounter for palliative care: Secondary | ICD-10-CM

## 2020-03-07 DIAGNOSIS — Z7189 Other specified counseling: Secondary | ICD-10-CM

## 2020-03-07 DIAGNOSIS — I480 Paroxysmal atrial fibrillation: Secondary | ICD-10-CM

## 2020-03-07 DIAGNOSIS — R0603 Acute respiratory distress: Secondary | ICD-10-CM | POA: Diagnosis not present

## 2020-03-07 DIAGNOSIS — I63511 Cerebral infarction due to unspecified occlusion or stenosis of right middle cerebral artery: Secondary | ICD-10-CM | POA: Diagnosis not present

## 2020-03-07 DIAGNOSIS — I63429 Cerebral infarction due to embolism of unspecified anterior cerebral artery: Secondary | ICD-10-CM | POA: Diagnosis not present

## 2020-03-07 DIAGNOSIS — J690 Pneumonitis due to inhalation of food and vomit: Secondary | ICD-10-CM

## 2020-03-07 LAB — BASIC METABOLIC PANEL
Anion gap: 12 (ref 5–15)
BUN: 40 mg/dL — ABNORMAL HIGH (ref 8–23)
CO2: 28 mmol/L (ref 22–32)
Calcium: 9.4 mg/dL (ref 8.9–10.3)
Chloride: 111 mmol/L (ref 98–111)
Creatinine, Ser: 1.07 mg/dL (ref 0.61–1.24)
GFR, Estimated: >60 mL/min (ref >60)
Glucose, Bld: 129 mg/dL — ABNORMAL HIGH (ref 70–99)
Potassium: 3.6 mmol/L (ref 3.5–5.1)
Sodium: 151 mmol/L — ABNORMAL HIGH (ref 135–145)

## 2020-03-07 LAB — GLUCOSE, CAPILLARY
Glucose-Capillary: 141 mg/dL — ABNORMAL HIGH (ref 70–99)
Glucose-Capillary: 152 mg/dL — ABNORMAL HIGH (ref 70–99)
Glucose-Capillary: 190 mg/dL — ABNORMAL HIGH (ref 70–99)
Glucose-Capillary: 247 mg/dL — ABNORMAL HIGH (ref 70–99)
Glucose-Capillary: 250 mg/dL — ABNORMAL HIGH (ref 70–99)
Glucose-Capillary: 256 mg/dL — ABNORMAL HIGH (ref 70–99)

## 2020-03-07 LAB — CBC
HCT: 43.1 % (ref 39.0–52.0)
Hemoglobin: 13.6 g/dL (ref 13.0–17.0)
MCH: 32.1 pg (ref 26.0–34.0)
MCHC: 31.6 g/dL (ref 30.0–36.0)
MCV: 101.7 fL — ABNORMAL HIGH (ref 80.0–100.0)
Platelets: 133 10*3/uL — ABNORMAL LOW (ref 150–400)
RBC: 4.24 MIL/uL (ref 4.22–5.81)
RDW: 13.9 % (ref 11.5–15.5)
WBC: 8.2 10*3/uL (ref 4.0–10.5)
nRBC: 0 % (ref 0.0–0.2)

## 2020-03-07 MED ORDER — TRAMADOL 5 MG/ML ORAL SUSPENSION: 50.0000 mg | Freq: Every day | ORAL | Status: DC | PRN

## 2020-03-07 MED ORDER — MORPHINE SULFATE (PF) 2 MG/ML IV SOLN: 2.0000 mg | INTRAVENOUS | Status: DC | PRN

## 2020-03-07 MED ORDER — CHLORHEXIDINE GLUCONATE 0.12 % MT SOLN
15.0000 mL | Freq: Two times a day (BID) | OROMUCOSAL | Status: DC
  Administered 2020-03-07 – 2020-03-12 (×12): 15 mL via OROMUCOSAL
  Filled 2020-03-07 (×8): qty 15

## 2020-03-07 MED ORDER — JEVITY 1.2 CAL PO LIQD
1000.0000 mL | ORAL | Status: DC
  Administered 2020-03-07 – 2020-03-09 (×4): 1000 mL
  Filled 2020-03-07 (×8): qty 1000

## 2020-03-07 MED ORDER — TRAMADOL HCL 50 MG PO TABS
50.0000 mg | ORAL_TABLET | Freq: Every day | ORAL | Status: DC | PRN
  Administered 2020-03-08: 50 mg
  Filled 2020-03-07: qty 1

## 2020-03-07 MED ORDER — TRAMADOL HCL 50 MG PO TABS: 50.0000 mg | ORAL_TABLET | Freq: Every day | ORAL | Status: DC | PRN

## 2020-03-07 MED ORDER — GLYCOPYRROLATE 0.2 MG/ML IJ SOLN
0.2000 mg | INTRAMUSCULAR | Status: DC | PRN
  Administered 2020-03-07: 0.2 mg via INTRAVENOUS
  Filled 2020-03-07: qty 1

## 2020-03-07 NOTE — Progress Notes (Signed)
Palliative:  I will meet today at 1430 with wife, son, and 2 daughters at bedside to discuss goals of care and decisions. Thank you for this consult.   Yong Channel, NP Palliative Medicine Team Pager [phone] (Please see [email or web address] for schedule) Team Phone [phone]

## 2020-03-07 NOTE — Progress Notes (Signed)
Nutrition Follow-up  DOCUMENTATION CODES:   Not applicable  INTERVENTION:   Tube feeding via Cortrak: Switch to Jevity 1.2 at 65 ml/h (1560 ml per day) Prosource TF 45 ml daily   Provides 1912 kcal, 97 gm protein, 1265 ml free water daily   NUTRITION DIAGNOSIS:   Inadequate oral intake related to inability to eat as evidenced by NPO status. Ongoing.   GOAL:   Patient will meet greater than or equal to 90% of their needs Met with TF.   MONITOR:   TF tolerance  REASON FOR ASSESSMENT:   Consult, Ventilator Enteral/tube feeding initiation and management  ASSESSMENT:   Pt with PMH of Afib, CAD, carotid stenosis, DM, HLD, and HTN admitted with R MCA stroke s/p attempted thrombectomy and stent placement.   11/26 cortrak placed; tip gastric; extubated  Pt working with therapy. Palliative care consulted for [place].   Medications reviewed and include: colace, SSI, 4 units novolog, 20 units lantus daily, miralax  Labs reviewed: Na 151 (goal 145-155) CBG's: [phone]   Diet Order:   Diet Order            Diet NPO time specified  Diet effective now                 EDUCATION NEEDS:   No education needs have been identified at this time  Skin:  Skin Assessment: Reviewed RN Assessment  Last BM:  12/1  Height:   Ht Readings from Last 1 Encounters:  02/16/2020 '5\' 6"'  (1.676 m)    Weight:   Wt Readings from Last 1 Encounters:  03/07/20 87.2 kg    Ideal Body Weight:  64.5 kg  BMI:  Body mass index is 31.03 kg/m.  Estimated Nutritional Needs:   Kcal:  1900-2100  Protein:  85-100 grams  Fluid:  2 L/day  Lockie Pares., RD, LDN, CNSC See AMiON for contact information

## 2020-03-07 NOTE — Consult Note (Signed)
Consultation Note Date: 03/07/2020   Patient Name: Richard Farmer  DOB: 11/12/1940  MRN: [id number]  Age / Sex: 79 y.o., male  PCP: Patient, No Pcp Per Referring Physician: Rosalin Hawking, MD  Reason for Consultation: Establishing goals of care  HPI/Patient Profile: 79 y.o. 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 He has extubated successfully but ongoing concern for aspiration risk.   Clinical Assessment and Goals of Care: I met today briefly with Mr. Dwyane and then I spoke privately with his family regarding goals of care. I spoke with wife Manuela Schwartz, son Lennette Bihari, daughter Colletta [place] and Loma Sousa and also with son Liane Comber (via telephone). We had long discussion regarding mixed messages for Mr. Bryor status. We discussed that it is good that Mr. Kamden has tolerated being off ventilator and able to protect airway so far. However, we have also been treating him for aspiration pneumonia and swallow function remains very poor and high risk of aspiration even of his own saliva which could lead [place] to further decline.   After discussion of expectations for quality of life after stroke and what aggressive care and interventions would look like for Mr. Desten family have decided that they do NOT desire reintubation and agree to DNR status. They are very overwhelmed but after discussion they agree that he would not be happy with quality of life living in facility, bed bound, and not being able to eat or even speak as he wishes. They know this as they have had other family members in this situation and have witnessed how difficult a life it is to be in a nursing facility. This was a very difficult realization for them to come from.   They do understand that he is likely to have further decline and complications due to poor  functional status. They would like to continue with current interventions for now while family visits with him. They do not wish to escalate care with any further decline but want to ensure he does not suffer and would opt for comfort care. They also show me where he wrote that he was "very tired" and we discussed the different meanings of what he meant by being tired.   I did speak with nursing and we have made an exception for adult grandchildren to visit (from [place]) all at one day and may change out 2 at bedside at a time all day during visiting hours. I think this is appropriate as he is high risk of decline and hopeful that family can visit while he is still awake and aware. Wife also allowed to remain to visit as she wishes. If he declines further we will further discuss liberalized visitation per end of life policy. Family are very appreciative of this allowance.   All questions/concerns addressed. Emotional support provided.   Of note, Mr. Nedim is from [place] and lost his parents at a young age. His family describe him as a very giving  person who is always caring and worried for everyone else. Family also express comfort "in knowing where he is going" and comfort in their faith.   Primary Decision Maker NEXT OF KIN wife    SUMMARY OF RECOMMENDATIONS   - DNR decided  - Comfort care with further decline - Ongoing goals of care conversations  Code Status/Advance Care Planning:  DNR   Symptom Management:   Chronic back pain: Tramadol 50 mg daily as needed. He also take Tylenol PM at night.   Dyspnea/pain: Morphine 2 mg IV every 4 hours as needed. IF needed please notify family to decline and need.   Secretions: Robinul 0.2 mg IV every 4 hours as needed.   Palliative Prophylaxis:   Aspiration, Bowel Regimen, Delirium Protocol, Frequent Pain Assessment, Oral Care and Turn Reposition  Psycho-social/Spiritual:   Desire for further Chaplaincy support:yes  Additional  Recommendations: Grief/Bereavement Support  Prognosis:   Overall prognosis poor.   Discharge Planning: To Be Determined      Primary Diagnoses: Present on Admission: . Acute right MCA stroke ([place])   I have reviewed the medical record, interviewed the patient and family, and examined the patient. The following aspects are pertinent.  Past Medical History:  Diagnosis Date  . Atrial fibrillation ([place])   . CAD (coronary artery disease)   . Carotid artery disease ([place])   . Diabetes mellitus ([place])   . Gout   . Hyperlipidemia   . Hypertension    Social History   Socioeconomic History  . Marital status: Married    Spouse name: Not on file  . Number of children: Not on file  . Years of education: Not on file  . Highest education level: Not on file  Occupational History  . Not on file  Tobacco Use  . Smoking status: Current Some Day Smoker    Types: Cigars  Substance and Sexual Activity  . Alcohol use: Not on file  . Drug use: Not on file  . Sexual activity: Not on file  Other Topics Concern  . Not on file  Social History Narrative  . Not on file   Social Determinants of Health   Financial Resource Strain:   . Difficulty of Paying Living Expenses: Not on file  Food Insecurity:   . Worried About Charity fundraiser in the Last Year: Not on file  . Ran Out of Food in the Last Year: Not on file  Transportation Needs:   . Lack of Transportation (Medical): Not on file  . Lack of Transportation (Non-Medical): Not on file  Physical Activity:   . Days of Exercise per Week: Not on file  . Minutes of Exercise per Session: Not on file  Stress:   . Feeling of Stress : Not on file  Social Connections:   . Frequency of Communication with Friends and Family: Not on file  . Frequency of Social Gatherings with Friends and Family: Not on file  . Attends Religious Services: Not on file  . Active Member of Clubs or Organizations: Not on file  . Attends Archivist  Meetings: Not on file  . Marital Status: Not on file   Family History  Problem Relation Age of Onset  . CAD Mother   . CAD Father   . CVA Father    Scheduled Meds: .  stroke: mapping our early stages of recovery book   Does not apply Once  . aspirin  325 mg Per Tube Daily   Or  .  aspirin  300 mg Rectal Daily  . atorvastatin  40 mg Per Tube Daily  . carvedilol  25 mg Per Tube BID WC  . chlorhexidine  15 mL Mouth Rinse BID  . Chlorhexidine Gluconate Cloth  6 each Topical Daily  . clopidogrel  75 mg Per Tube Daily  . docusate  100 mg Per Tube BID  . enoxaparin (LOVENOX) injection  40 mg Subcutaneous Q24H  . feeding supplement (PROSource TF)  45 mL Per Tube Daily  . insulin aspart  0-15 Units Subcutaneous Q4H  . insulin aspart  4 Units Subcutaneous Q4H  . insulin glargine  20 Units Subcutaneous Daily  . losartan  100 mg Per Tube Daily  . mouth rinse  15 mL Mouth Rinse BID  . pantoprazole sodium  40 mg Per Tube Daily  . polyethylene glycol  17 g Per Tube Daily   Continuous Infusions: . sodium chloride 10 mL/hr at 03/06/20 0909  . ampicillin-sulbactam (UNASYN) IV 3 g (03/07/20 1519)  . feeding supplement (JEVITY 1.2 CAL) 1,000 mL (03/07/20 1515)   PRN Meds:.sodium chloride, acetaminophen **OR** acetaminophen (TYLENOL) oral liquid 160 mg/5 mL **OR** acetaminophen, morphine injection, senna-docusate Allergies  Allergen Reactions  . Ibuprofen Other (See Comments)    Stomach irritation    Review of Systems  Constitutional: Positive for activity change, appetite change and fatigue.  HENT: Positive for trouble swallowing.   Neurological: Positive for weakness and headaches.    Physical Exam Vitals and nursing note reviewed.  Constitutional:      General: He is not in acute distress.    Appearance: He is ill-appearing.  Cardiovascular:     Rate and Rhythm: Tachycardia present. Rhythm irregularly irregular.  Pulmonary:     Effort: Pulmonary effort is normal. No tachypnea,  accessory muscle usage or respiratory distress.  Abdominal:     General: Abdomen is flat.     Palpations: Abdomen is soft.  Neurological:     Mental Status: He is alert.     Comments: Able to nod head yes/no; appears to be oriented        Pain Score: 0-No pain   SpO2: SpO2: 97 % O2 Device:SpO2: 97 % O2 Flow Rate: .O2 Flow Rate (L/min): 2 L/min  IO: Intake/output summary:   Intake/Output Summary (Last 24 hours) at 03/07/2020 1701 Last data filed at 03/07/2020 1546 Gross per 24 hour  Intake 1667.24 ml  Output 1025 ml  Net 642.24 ml    LBM: Last BM Date: 03/07/20 Baseline Weight: Weight: 87 kg Most recent weight: Weight: 87.2 kg     Palliative Assessment/Data:     Time In: 1430 Time Out: 1645 Time Total: 135 min Greater than 50%  of this time was spent counseling and coordinating care related to the above assessment and plan.  Signed by: Vinie Sill, NP Palliative Medicine Team Pager # [phone] (M-F 8a-5p) Team Phone # [phone] (Nights/Weekends)

## 2020-03-07 NOTE — Progress Notes (Signed)
  Speech Language Pathology Treatment: Dysphagia  Patient Details Name: Richard Farmer MRN: [id number] DOB: Mar 03, 1941 Today's Date: 03/07/2020 Time: 6629-4765 SLP Time Calculation (min) (ACUTE ONLY): 12 min  Assessment / Plan / Recommendation Clinical Impression  Pt was seen while upright in the chair. Oral care was provided as his mouth is dry (NPO, on supplemental O2, breathes with his mouth open) and there is a coating present on his hard palate and dentition. SLP provided several trials of ice chips and less water via spoon for differential dx of swallowing abilities but also to try to provide more moisture. Oral holding is prominent, needing Mod-Max cues to attend and initiate swallowing. His voice is aphonic but becomes wet with ice chips, and he cannot clear this despite attempts at cued coughing/throat clearing. Water elicits a stronger, more prolonged coughing episode. Recommend that he remain NPO with emphasis on oral hygiene, with hopeful ability to at least progress to ice chips soon to provide more moisture. Attempted to increase attention and eye opening by showing pt a card written by his family. Pt opened his eyes for 1-2 seconds at a time but did not read what was written on the card. Will continue to follow.    HPI HPI: 79 y.o. M of atrial fibrillation on coumadin, CAD, carotid stenosis, DM, HL and HTN who presented with L-sided weakness and found to have right ICA cervical occlusion s/p stenting right ICA and unsuccessful thrombectomy of right MCA/M1 and intracranial ICA. Intubated from 11/24-11/26/21.      SLP Plan  Continue with current plan of care       Recommendations  Diet recommendations: NPO                Oral Care Recommendations: Oral care QID Follow up Recommendations: Inpatient Rehab SLP Visit Diagnosis: Dysphagia, oropharyngeal phase (R13.12) Plan: Continue with current plan of care       GO                Mahala Menghini., M.A. CCC-SLP Acute  Rehabilitation Services Pager [phone] Office [phone]  03/07/2020, 10:50 AM

## 2020-03-07 NOTE — Plan of Care (Signed)
  Problem: Education: Goal: Knowledge of secondary prevention will improve Outcome: Progressing Goal: Individualized Educational Video(s) Outcome: Progressing   Problem: Coping: Goal: Will verbalize positive feelings about self Outcome: Progressing   Problem: Self-Care: Goal: Ability to communicate needs accurately will improve Outcome: Progressing   Problem: Nutrition: Goal: Dietary intake will improve Outcome: Progressing

## 2020-03-07 NOTE — Progress Notes (Signed)
STROKE TEAM PROGRESS NOTE   INTERVAL HISTORY RN at bedside.  Patient sitting in chair, neuro unchanged, attempt to open eyes with voice, still follow simple commands.  Seems to have decreased oral secretion compared with yesterday, no fever, no leukocytosis.  Palliative care on board, family have decided for DNR and NO reintubation. They would elect comfort care with next decline and not to treat any further pneumonia. Not prepared to de-escalate but they would not want long term feeding tube. Keeping Cortrak for now.  I also called patient wife over the phone.    Vitals:   03/07/20 0600 03/07/20 0700 03/07/20 0800 03/07/20 0809  BP: 96/65 116/88 132/80   Pulse: 87 89 95   Resp: 19 (!) 21 (!) 32   Temp:    98.2 F (36.8 C)  TempSrc:    Axillary  SpO2: 98% 100% 99%   Weight:      Height:       CBC:  Recent Labs  Lab 02/18/2020 1317 02/14/2020 1321 03/06/20 0704 03/07/20 0359  WBC 6.3   < > 9.2 8.2  NEUTROABS 4.2  --   --   --   HGB 13.6   < > 13.9 13.6  HCT 42.1   < > 44.7 43.1  MCV 100.5*   < > 102.3* 101.7*  PLT 161   < > 127* 133*   < > = values in this interval not displayed.   Basic Metabolic Panel:  Recent Labs  Lab 03/03/20 1651 03/03/20 2205 03/04/20 1040 03/04/20 1040 03/04/20 1626 03/05/20 0610 03/06/20 0704 03/07/20 0359  NA 153*   < > 153*   < > 155*   < > 153* 151*  K  --   --  3.6  --   --    < > 3.7 3.6  CL  --   --  115*  --   --    < > 110 111  CO2  --   --  28  --   --    < > 31 28  GLUCOSE  --   --  223*  --   --    < > 248* 129*  BUN  --   --  26*  --   --    < > 35* 40*  CREATININE  --   --  1.09  --   --    < > 0.97 1.07  CALCIUM  --   --  8.9  --   --    < > 9.2 9.4  MG 2.0  --  1.7  --  1.9  --   --   --   PHOS 1.8*  --  2.4*  --   --   --   --   --    < > = values in this interval not displayed.   Lipid Panel:  Recent Labs  Lab 03/02/20 0432 03/03/20 0613 03/06/20 0704  CHOL 96  --   --   TRIG 132   < > 109  HDL 28*  --   --    CHOLHDL 3.4  --   --   VLDL 26  --   --   LDLCALC 42  --   --    < > = values in this interval not displayed.   HgbA1c:  Recent Labs  Lab 03/01/20 0500  HGBA1C 6.3*   Urine Drug Screen: No results for input(s): LABOPIA, COCAINSCRNUR, LABBENZ, AMPHETMU, THCU, LABBARB  in the last 168 hours.  Alcohol Level No results for input(s): ETH in the last 168 hours.  IMAGING past 24 hours No results found.   PHYSICAL EXAM   Temp:  [97.5 F (36.4 C)-98.9 F (37.2 C)] 98.2 F (36.8 C) (12/01 0809) Pulse Rate:  [84-103] 95 (12/01 0800) Resp:  [14-32] 32 (12/01 0800) BP: (84-146)/(63-100) 132/80 (12/01 0800) SpO2:  [96 %-100 %] 99 % (12/01 0800) Weight:  [87.2 kg] 87.2 kg (12/01 0500)  General - Well nourished, well developed, lethargic, sitting in chair.  Ophthalmologic - fundi not visualized due to noncooperation.  Cardiovascular - irregularly irregular heart rate and rhythm.  Neuro - attempt to open eyes with voice but not able to open, lethargic, able to follow simple commands on the right and midline. Nonverbal though. With forced eye opening, pt not blinking to visual threat bilaterally, right gaze preference, not able to cross midline. Able to track on the right with eye movement to the right. PERRL. Mild left facial weakness.  Tongue protrusion not cooperative.  Motor system exam shows spontaneous purposeful right upper extremity movements barely against gravity, RLE withdraw 3-/5 to pain and moving toes PF/DF on request.  Left upper and lower extremity hypotonic and slight withdraw respond to painful stimulus. No babinski bilaterally.  Gait not tested   ASSESSMENT/PLAN [name] is a 79 y.o. male with history of AF on warfarin, know ICA stenosis presenting with L sided weakness and hemianopsia. He did not receive tPA.  Taken to IR for emergent stenting of R ICA occlusion.  Stroke:   R MCA infarct d/t R ICA bulb occlusion s/p unsuccessful IR and R ICA stent with continued R  ICA supraclinoid and R MCA occlusion, etiology uncertain, afib with subtherapeutic INR vs. Large vessel disease  CT head no acute abnormality. Possible R forehead scalp hematoma/contusion  CTA head & neck R cervical ICA origin occlusion with reconstitution at terminus. Critical stenosis brachiocephalic artery. Severe stenosis R P2. Multifocal moderate R A2, proximal R P2, severe distal R P2, B mid VA, L VA origin stenoses. B ICA atherosclerosis w/ L ICA web. Severe degenerative C6-7 disc dz.  CT perfusion large penumbra  Cerebral angio / IR - R ICA occlusion from bulb to proximal ICA terminus. Stent R ICA. ICA remains occluded after ICA stent, secondary to occlusion at the supraclinoid segment. Right M1 remains occluded, embolization to new territory.  MRI  R MCA infarct w/ sparing of putamen, globus pallidus and caudate head. Petechial hemorrhage. No shift. Abnormal flor R ICA w/ partial dissection.  CT Head 11/28 - Continued interval evolution of subacute right MCA territory infarct, stable in size and distribution from previous. Slightly worsened localized edema and partial effacement of the right lateral ventricle, but with no more than trace right-to-left midline shift.  2D Echo EF 60-65%. No source of embolus. LA moderately dilated  LDL 42     HgbA1c 6.3  VTE prophylaxis - Lovenox 40 mg sq daily   aspirin 81 mg daily and warfarin daily prior to admission w/ INR 1.4 on arrival, now on aspirin 81 mg daily and clopidogrel 75 mg daily.   Therapy recommendations:  CIR vs SNF  Disposition:  pending   Family have decided for DNR and NO reintubation. They would elect comfort care with next decline and not to treat any further pneumonia. Not prepared to de-escalate but they would not want long term feeding tube. Keeping Cortrak for now.  Acute Respiratory Failure  Secondary  to stroke  Intubated for IR, extubated 03/02/2020  Still have difficulty clearing secretions  Close  monitoring respiratory status  CCM sign off   Cytotoxic cerebral edema Induced Hypernatremia  MRI large right MCA infarct with cytotoxic edema and effacement of right lateral ventricle  On 3% at 75cc -> 50cc / hr->off   Goal Na 145-155  Na 145->146->149->150->148->153->155->153->153->151  CT 11/28 - Continued interval evolution of subacute right MCA territory infarct, stable in size and distribution from previous. Slightly worsened localized edema and partial effacement of the right lateral ventricle, but with no more than trace right-to-left midline shift.  Chronic Atrial Fibrillation on Coumadin with subtherapeutic INR  Home anticoagulation:  warfarin daily + asa 81  Admission INR 1.4 -> 1.5  On DAPT now  Not an AC candidate at this time d/t large stroke sites and risk of hemorrhagic transformation    Consider resume anticoagulation in 10 to 14 days post stroke  Aspiration pneumonia LLL  Fever T-max 100.6->100.1->afebrile  Leukocytosis, resolved  WBC 11.9->11.7->9.2->8.2  Extubated 11/26 but still have copious secretions  CXR small left pleural effusion and infiltration or atelectasis in the left base  On Unasyn 11/27>>  (x 7 days)  Resp Cx 11/28 reincubated  CCM on board  NT suctioning as needed  On 3% neb and Chest PT  Hypertension  Home meds:  Coreg 12.5 bid, losartan 50, Maxzide . BP stable on the low end . Resume Coreg 12.5->25 bid  . D/c losartan 100 . D/c HCTZ 12.5  . Long-term BP goal normotensive  Hyperlipidemia  Home meds:  lipitor 80 + omega 3  LDL 42, goal < 70  On Lipitor 40  Continue statin on discharge  Diabetes type II Controlled  Home meds - amaryl  HgbA1c 6.3, goal < 7.0  CBGs  SSI  On Lantus 15->20 U daily  Glucose fluctuate  PT follow-up  Dysphagia . Secondary to stroke . NPO . Cortrak on TF @ 65 . Speech on board   Tobacco abuse  Current smoker  Smoking cessation counseling will be provided  Pt is  willing to quit  Other Stroke Risk Factors  Advanced Age >/= 65   ETOH use 4x wk  Obesity, Body mass index is 31.03 kg/m., BMI >/= 30 associated with increased stroke risk, recommend weight loss, diet and exercise as appropriate   Family hx stroke (father)  Coronary artery disease s/p stent  Other Active Problems  Gout on allopurinal 300 daily  GERD on prilosec  BPH on flomax  Hypokalemia K 2.4->4.4->3.7->3.3->3.0->3.6->3.4->3.7->3.6  Mild thrombocytopenia - platelets - 161->127->126->139->127->133  Hospital day # 7  This patient is critically ill due to large right MCA stroke, cerebral edema, aspiration pneumonia, PAF, respiratory distress, fluctuating BP and at significant risk of neurological worsening, death form recurrent stroke, hemorrhagic conversion, seizure, respiratory failure, heart failure. This patient's care requires constant monitoring of vital signs, hemodynamics, respiratory and cardiac monitoring, review of multiple databases, neurological assessment, discussion with family, other specialists and medical decision making of high complexity. I spent 35 minutes of neurocritical care time in the care of this patient. I had long discussion with wife over the phone, updated pt current condition, treatment plan and potential prognosis, and answered all the questions.  She expressed understanding and appreciation.  I also discussed with palliative care NP Helmut Muster.   Marvel Plan, MD PhD Stroke Neurology 03/07/2020 8:49 AM   To contact Stroke Continuity provider, please refer to [email or web address]. After hours, contact General Neurology

## 2020-03-07 NOTE — Progress Notes (Signed)
Physical Therapy Treatment Patient Details Name: Richard Farmer MRN: [id number] DOB: 04/04/1941 Today's Date: 03/07/2020    History of Present Illness This 79 y.o. male admitted with acute onset Lt sided weakness.  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  CT of head showed large Rt MCA territory infarct, no hemorrhagic transformation, localized edema.  HTN, Gout, DM, CAD, A-Fib, s/p Rt TKA,     PT Comments    Pt sleeping upon arrival to room, wakes with mod verbal and tactile cuing from PT. PT assisted pt OOB to drop arm recliner via scoot pivot, pt requiring total assist +2 for bed mobility and transfer OOB this day. Pt requiring less physical assist to maintain upright sitting EOB today vs yesterday, with more periods of eye opening when cued. Pt noted to not attend to L today, will continue to assess. Will continue to follow acutely.     Follow Up Recommendations  CIR;SNF;Other (comment)     Equipment Recommendations  Other (comment);Wheelchair (measurements PT);Wheelchair cushion (measurements PT) (TBD)    Recommendations for Other Services       Precautions / Restrictions Precautions Precautions: Fall Restrictions Weight Bearing Restrictions: No    Mobility  Bed Mobility Overal bed mobility: Needs Assistance Bed Mobility: Rolling;Sidelying to Sit Rolling: Total assist Sidelying to sit: Total assist       General bed mobility comments: Total assist for roll and elevation of trunk and LEs to EOB. Scooting to EOB with use of bed pads, posteiror truncal assist provided throughout  Transfers Overall transfer level: Needs assistance Equipment used: 2 person hand held assist Transfers: Lateral/Scoot Transfers          Lateral/Scoot Transfers: Total assist;+2 safety/equipment;+2 physical assistance;From elevated surface General transfer comment: Total +2 for all aspects to drop arm  recliner, transfer towards R (strong side).  Ambulation/Gait             General Gait Details: NT   Stairs             Wheelchair Mobility    Modified Rankin (Stroke Patients Only) Modified Rankin (Stroke Patients Only) Pre-Morbid Rankin Score: No symptoms Modified Rankin: Severe disability     Balance Overall balance assessment: Needs assistance Sitting-balance support: Feet supported Sitting balance-Leahy Scale: Poor Sitting balance - Comments: min-mod PT truncal assist to maintain upright sitting, pt demonstrates ability to lean trunk anteriorly when cued Postural control: Posterior lean   Standing balance-Leahy Scale: Zero                              Cognition Arousal/Alertness: Lethargic Behavior During Therapy: Flat affect Overall Cognitive Status: Difficult to assess Area of Impairment: Attention;Following commands;Problem solving;Orientation                 Orientation Level: Place;Situation;Disoriented to Current Attention Level: Focused;Sustained   Following Commands: Follows one step commands inconsistently;Follows one step commands with increased time     Problem Solving: Slow processing;Decreased initiation;Difficulty sequencing;Requires verbal cues;Requires tactile cues General Comments: Pt keeps eyes closed most of session, requires max cuing to keep eyes open. Pt not attending to L when cued, possible neglect vs inattention but difficult to discern given pt low arousal level.      Exercises General Exercises - Lower Extremity Ankle Circles/Pumps: PROM;Both;10 reps;Seated    General Comments General comments (skin integrity, edema, etc.): vssn 2LO2 via  [place]      Pertinent Vitals/Pain Pain Assessment: No/denies pain Faces Pain Scale: No hurt Pain Intervention(s): Limited activity within patient's tolerance;Monitored during session    Home Living                      Prior Function            PT Goals  (current goals can now be found in the care plan section) Acute Rehab PT Goals Patient Stated Goal: pt unable to state. PT Goal Formulation: Patient unable to participate in goal setting Time For Goal Achievement: 03/16/20 Potential to Achieve Goals: Fair Progress towards PT goals: Progressing toward goals    Frequency    Min 3X/week      PT Plan Current plan remains appropriate    Co-evaluation              AM-PAC PT "6 Clicks" Mobility   Outcome Measure  Help needed turning from your back to your side while in a flat bed without using bedrails?: Total Help needed moving from lying on your back to sitting on the side of a flat bed without using bedrails?: Total Help needed moving to and from a bed to a chair (including a wheelchair)?: Total Help needed standing up from a chair using your arms (e.g., wheelchair or bedside chair)?: Total Help needed to walk in hospital room?: Total Help needed climbing 3-5 steps with a railing? : Total 6 Click Score: 6    End of Session Equipment Utilized During Treatment: Oxygen (2LO2) Activity Tolerance: Patient limited by fatigue Patient left: with call bell/phone within reach;with SCD's reapplied;in chair;with chair alarm set Nurse Communication: Mobility status;Need for lift equipment PT Visit Diagnosis: Hemiplegia and hemiparesis Hemiplegia - Right/Left: Left Hemiplegia - dominant/non-dominant: Non-dominant Hemiplegia - caused by: Cerebral infarction     Time: 3825-0539 PT Time Calculation (min) (ACUTE ONLY): 20 min  Charges:  $Therapeutic Activity: 8-22 mins                    Richard Farmer E, PT Acute Rehabilitation Services Pager [phone]  Office [phone]   Richard Farmer D Richard Farmer 03/07/2020, 10:34 AM

## 2020-03-07 DEATH — deceased

## 2020-03-08 DIAGNOSIS — Z4659 Encounter for fitting and adjustment of other gastrointestinal appliance and device: Secondary | ICD-10-CM | POA: Diagnosis not present

## 2020-03-08 DIAGNOSIS — Z515 Encounter for palliative care: Secondary | ICD-10-CM | POA: Diagnosis not present

## 2020-03-08 DIAGNOSIS — I482 Chronic atrial fibrillation, unspecified: Secondary | ICD-10-CM | POA: Diagnosis not present

## 2020-03-08 DIAGNOSIS — I63511 Cerebral infarction due to unspecified occlusion or stenosis of right middle cerebral artery: Secondary | ICD-10-CM | POA: Diagnosis not present

## 2020-03-08 DIAGNOSIS — G936 Cerebral edema: Secondary | ICD-10-CM | POA: Diagnosis not present

## 2020-03-08 DIAGNOSIS — Z7189 Other specified counseling: Secondary | ICD-10-CM | POA: Diagnosis not present

## 2020-03-08 LAB — CBC
HCT: 40.6 % (ref 39.0–52.0)
Hemoglobin: 13.2 g/dL (ref 13.0–17.0)
MCH: 32.8 pg (ref 26.0–34.0)
MCHC: 32.5 g/dL (ref 30.0–36.0)
MCV: 100.7 fL — ABNORMAL HIGH (ref 80.0–100.0)
Platelets: 127 10*3/uL — ABNORMAL LOW (ref 150–400)
RBC: 4.03 MIL/uL — ABNORMAL LOW (ref 4.22–5.81)
RDW: 13.5 % (ref 11.5–15.5)
WBC: 6.6 10*3/uL (ref 4.0–10.5)
nRBC: 0 % (ref 0.0–0.2)

## 2020-03-08 LAB — GLUCOSE, CAPILLARY
Glucose-Capillary: 222 mg/dL — ABNORMAL HIGH (ref 70–99)
Glucose-Capillary: 205 mg/dL — ABNORMAL HIGH (ref 70–99)
Glucose-Capillary: 197 mg/dL — ABNORMAL HIGH (ref 70–99)
Glucose-Capillary: 222 mg/dL — ABNORMAL HIGH (ref 70–99)
Glucose-Capillary: 274 mg/dL — ABNORMAL HIGH (ref 70–99)
Glucose-Capillary: 219 mg/dL — ABNORMAL HIGH (ref 70–99)

## 2020-03-08 LAB — BASIC METABOLIC PANEL
Anion gap: 10 (ref 5–15)
BUN: 45 mg/dL — ABNORMAL HIGH (ref 8–23)
CO2: 28 mmol/L (ref 22–32)
Calcium: 9.2 mg/dL (ref 8.9–10.3)
Chloride: 113 mmol/L — ABNORMAL HIGH (ref 98–111)
Creatinine, Ser: 0.96 mg/dL (ref 0.61–1.24)
GFR, Estimated: >60 mL/min (ref >60)
Glucose, Bld: 297 mg/dL — ABNORMAL HIGH (ref 70–99)
Potassium: 3.7 mmol/L (ref 3.5–5.1)
Sodium: 151 mmol/L — ABNORMAL HIGH (ref 135–145)

## 2020-03-08 MED ORDER — ACETAMINOPHEN 160 MG/5ML PO SOLN
650.0000 mg | Freq: Four times a day (QID) | ORAL | Status: DC
  Administered 2020-03-08 – 2020-03-11 (×11): 650 mg
  Filled 2020-03-08 (×11): qty 20.3

## 2020-03-08 MED ORDER — CARVEDILOL 12.5 MG PO TABS
12.5000 mg | ORAL_TABLET | Freq: Two times a day (BID) | ORAL | Status: DC
  Administered 2020-03-09 – 2020-03-10 (×4): 12.5 mg
  Filled 2020-03-08 (×4): qty 1

## 2020-03-08 MED ORDER — INSULIN ASPART 100 UNIT/ML ~~LOC~~ SOLN
6.0000 [IU] | SUBCUTANEOUS | Status: DC
  Administered 2020-03-08 – 2020-03-10 (×10): 6 [IU] via SUBCUTANEOUS

## 2020-03-08 NOTE — Progress Notes (Signed)
Inpatient Diabetes Program Recommendations  AACE/ADA: New Consensus Statement on Inpatient Glycemic Control (2015)  Target Ranges:  Prepandial:   less than 140 mg/dL      Peak postprandial:   less than 180 mg/dL (1-2 hours)      Critically ill patients:  140 - 180 mg/dL   Lab Results  Component Value Date   GLUCAP 197 (H) 03/08/2020   HGBA1C 6.3 (H) 03/01/2020    Review of Glycemic Control Results for Richard Farmer, Richard Farmer (MRN [id number]) as of 03/08/2020 14:14  Ref. Range 03/07/2020 19:12 03/07/2020 23:14 03/08/2020 03:01 03/08/2020 07:31 03/08/2020 11:10  Glucose-Capillary Latest Ref Range: 70 - 99 mg/dL 626 (H) 948 (H) 546 (H) 205 (H) 197 (H)   Diabetes history:  DM2 Outpatient Diabetes medications:  Amaryl 4 mg daily Current orders for Inpatient glycemic control:  Lantus 20 units daily Novolog 4 units Q4H Novolog 0-15 units Q4H  Inpatient Diabetes Program Recommendations:     Novolog 6 units Q4H tube feed coverage.  Stop if feeds are held or discontinued  Will continue to follow while inpatient.  Thank you, Dulce Sellar, RN, BSN Diabetes Coordinator Inpatient Diabetes Program [phone] (team pager from 8a-5p)

## 2020-03-08 NOTE — Progress Notes (Signed)
STROKE TEAM PROGRESS NOTE   INTERVAL HISTORY Daughter at bedside.  Patient no significant change from yesterday, seem to be a little bit more awake alert, able to open eyes on voice.  Still has some difficulty with secretion, not able to swallow.    Vitals:   03/08/20 0839 03/08/20 0900 03/08/20 1000 03/08/20 1100  BP: (!) 135/111 117/74 103/65 92/73  Pulse: 94 81 90 84  Resp: (!) 28 (!) 21 (!) 25 (!) 25  Temp:      TempSrc:      SpO2: 98% 100% 100% 99%  Weight:      Height:       CBC:  Recent Labs  Lab 03/07/20 0359 03/08/20 0008  WBC 8.2 6.6  HGB 13.6 13.2  HCT 43.1 40.6  MCV 101.7* 100.7*  PLT 133* 127*   Basic Metabolic Panel:  Recent Labs  Lab 03/03/20 1651 03/03/20 2205 03/04/20 1040 03/04/20 1040 03/04/20 1626 03/05/20 0610 03/07/20 0359 03/08/20 0008  NA 153*   < > 153*   < > 155*   < > 151* 151*  K  --   --  3.6  --   --    < > 3.6 3.7  CL  --   --  115*  --   --    < > 111 113*  CO2  --   --  28  --   --    < > 28 28  GLUCOSE  --   --  223*  --   --    < > 129* 297*  BUN  --   --  26*  --   --    < > 40* 45*  CREATININE  --   --  1.09  --   --    < > 1.07 0.96  CALCIUM  --   --  8.9  --   --    < > 9.4 9.2  MG 2.0  --  1.7  --  1.9  --   --   --   PHOS 1.8*  --  2.4*  --   --   --   --   --    < > = values in this interval not displayed.   Lipid Panel:  Recent Labs  Lab 03/02/20 0432 03/03/20 0613 03/06/20 0704  CHOL 96  --   --   TRIG 132   < > 109  HDL 28*  --   --   CHOLHDL 3.4  --   --   VLDL 26  --   --   LDLCALC 42  --   --    < > = values in this interval not displayed.   HgbA1c:  No results for input(s): HGBA1C in the last 168 hours. Urine Drug Screen: No results for input(s): LABOPIA, COCAINSCRNUR, LABBENZ, AMPHETMU, THCU, LABBARB in the last 168 hours.  Alcohol Level No results for input(s): ETH in the last 168 hours.  IMAGING past 24 hours No results found.   PHYSICAL EXAM  Temp:  [97.8 F (36.6 C)-98.8 F (37.1 C)] 98  F (36.7 C) (12/02 0800) Pulse Rate:  [41-107] 84 (12/02 1100) Resp:  [18-37] 25 (12/02 1100) BP: (81-149)/(54-111) 92/73 (12/02 1100) SpO2:  [96 %-100 %] 99 % (12/02 1100) Weight:  [87.1 kg] 87.1 kg (12/02 0459)  General - Well nourished, well developed, lethargic, sitting in chair.  Ophthalmologic - fundi not visualized due to noncooperation.  Cardiovascular - irregularly  irregular heart rate and rhythm.  Neuro - slightly open eyes with voice today, still lethargic, but able to follow simple commands on the right and midline.  Still no speech output. With eye opening, pt not blinking to visual threat bilaterally, right gaze preference, not able to cross midline. Able to track on the right with eye movement to the right. PERRL. Mild left facial weakness.  Tongue midline mouth.  Motor system exam shows spontaneous purposeful right upper extremity movements barely against gravity, RLE withdraw 3-/5 to pain and moving toes PF/DF on request.  Left upper and lower extremity hypotonic and slight withdraw respond to painful stimulus. No babinski bilaterally.  Gait not tested   ASSESSMENT/PLAN Mr. Freddy Kinne is a 79 y.o. male with history of AF on warfarin, know ICA stenosis presenting with L sided weakness and hemianopsia. He did not receive tPA.  Taken to IR for emergent stenting of R ICA occlusion.  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 Large vessel disease  CT head no acute abnormality. Possible R forehead scalp hematoma/contusion  CTA head & neck R cervical ICA origin occlusion with reconstitution at terminus. Critical stenosis brachiocephalic artery. Severe stenosis R P2. Multifocal moderate R A2, proximal R P2, severe distal R P2, B mid VA, L VA origin stenoses. B ICA atherosclerosis w/ L ICA web. Severe degenerative C6-7 disc dz.  CT  perfusion large penumbra  Cerebral angio / IR - R ICA occlusion from bulb to proximal ICA terminus. Stent R ICA. ICA remains occluded after ICA stent, secondary to occlusion at the supraclinoid segment. Right M1 remains occluded, embolization to new territory.  MRI  R MCA infarct w/ sparing of putamen, globus pallidus and caudate head. Petechial hemorrhage. No shift. Abnormal flor R ICA w/ partial dissection.  CT Head 11/28 - Continued interval evolution of subacute right MCA territory infarct, stable in size and distribution from previous. Slightly worsened localized edema and partial effacement of the right lateral ventricle, but with no more than trace right-to-left midline shift.  2D Echo EF 60-65%. No source of embolus. LA moderately dilated  LDL 42     HgbA1c 6.3  VTE prophylaxis - Lovenox 40 mg sq daily   aspirin 81 mg daily and warfarin daily prior to admission w/ INR 1.4 on arrival, now on aspirin 81 mg daily and clopidogrel 75 mg daily.   Therapy recommendations:  CIR vs SNF  Disposition:  pending   Family have decided for DNR and NO reintubation. They would elect comfort care with next decline and not to treat any further pneumonia. Not prepared to de-escalate but they would not want long term feeding tube. Keeping Cortrak for now. Palliative care onboard.  Acute Respiratory Failure  Secondary to stroke  Intubated for IR, extubated 03/02/2020  Still have difficulty clearing secretions  Close monitoring respiratory status  CCM sign off   Cytotoxic cerebral edema Induced Hypernatremia  MRI large right MCA infarct with cytotoxic edema and effacement of right lateral ventricle  On 3% at 75cc -> 50cc / hr->off   Goal Na 145-155  Na 145->146->149->150->148->153->155->153->153->151->151  CT 11/28 - Continued interval evolution of subacute right MCA territory infarct, stable in size and distribution from previous. Slightly worsened localized edema and partial  effacement of the right lateral ventricle, but with no more than trace right-to-left midline shift.  Chronic Atrial Fibrillation on Coumadin with subtherapeutic INR  Home anticoagulation:  warfarin daily + asa 81  Admission INR 1.4 -> 1.5  On DAPT now  Not an AC candidate at this time d/t large stroke sites and risk of hemorrhagic transformation    Consider resume anticoagulation in 10 to 14 days post stroke  Aspiration pneumonia LLL  Fever T-max 100.6->100.1->afebrile  Leukocytosis, resolved  WBC 11.9->11.7->9.2->8.2->6.6  Extubated 11/26 but still have copious secretions  CXR small left pleural effusion and infiltration or atelectasis in the left base  On Unasyn 11/27>>  (x 7 days)  Resp Cx 11/28 reincubated  CCM signed off  NT suctioning as needed  On Chest PT  Hypertension  Home meds:  Coreg 12.5 bid, losartan 50, Maxzide . BP on the low end . Resume Coreg 12.5->25->12.5 bid . D/c losartan 100 . D/c HCTZ 12.5  . Long-term BP goal normotensive  Hyperlipidemia  Home meds:  lipitor 80 + omega 3  LDL 42, goal < 70  On Lipitor 40  Continue statin on discharge  Diabetes type II Controlled  Home meds - amaryl  HgbA1c 6.3, goal < 7.0  CBGs  SSI  On Lantus 15->20 U daily  novolog 4U ->6U Q4h  Hyperglycemia  PCP follow up closely  Dysphagia . Secondary to stroke . NPO . Cortrak on TF @ 65 . Speech on board   Tobacco abuse  Current smoker  Smoking cessation counseling will be provided  Pt is willing to quit  Other Stroke Risk Factors  Advanced Age >/= 65   ETOH use 4x wk  Obesity, Body mass index is 30.99 kg/m., BMI >/= 30 associated with increased stroke risk, recommend weight loss, diet and exercise as appropriate   Family hx stroke (father)  Coronary artery disease s/p stent  Other Active Problems  Gout on allopurinal 300 daily  GERD on prilosec  BPH on flomax  Hypokalemia K  2.4->4.4->3.7->3.3->3.0->3.6->3.4->3.7->3.6->3.7  Hospital day # 8  This patient is critically ill due to Large right MCA stroke, cerebral edema, aspiration pneumonia, dysphagia, chronic A. fib and at significant risk of neurological worsening, death form recurrent stroke, brain herniation, sepsis, heart failure, seizure. This patient's care requires constant monitoring of vital signs, hemodynamics, respiratory and cardiac monitoring, review of multiple databases, neurological assessment, discussion with family, other specialists and medical decision making of high complexity. I spent 40 minutes of neurocritical care time in the care of this patient. I had long discussion with daughter at bedside and on the phone, updated pt current condition, treatment plan and potential prognosis, and answered all the questions. She expressed understanding and appreciation.    Marvel Plan, MD PhD Stroke Neurology 03/08/2020 12:06 PM   To contact Stroke Continuity provider, please refer to [email or web address]. After hours, contact General Neurology

## 2020-03-08 NOTE — Progress Notes (Signed)
Palliative:  HPI: 79 y.o. male  with past medical history of atrial fibrillation on warfarin, carotid stenosis admitted on 02/13/2020 with right MCA infarct with left sided weakness. He has extubated successfully but ongoing concern for aspiration risk.   I met today at Richard Farmer bedside. No family currently at bedside. He is sleeping and does not awaken to my assessment. I listened to his lungs which are clear but shallow breathes and diminished in bases. He was not coughing but RN reports he continues with congested cough and unable to expectorate.   I called and spoke with wife, Richard Farmer. Richard Farmer shares that her daughter was visiting today. Discussed above. Richard Farmer reports that she was told he rested well most of the day but would awaken and interact with daughter. He continues to complain of headache - discussed scheduling Tylenol to see if this provides more relief and she agrees. She has no further questions or concerns at this time. She plans to likely be at the hospital tomorrow so we will touch base again tomorrow.   All questions/concerns addressed. Emotional support provided.   Exam: Sleeping soundly. No distress. HR 80s. Breathing shallow, clear but diminished bases. Abd soft. L sided hemiplegia.   Plan: - Scheduled Tylenol for headaches.  - Comfort care with further decline.  - DNR decided yesterday.   [place], NP Palliative Medicine Team Pager [phone] (Please see [email or web address] for schedule) Team Phone [phone]    Greater than 50%  of this time was spent counseling and coordinating care related to the above assessment and plan

## 2020-03-09 DIAGNOSIS — Z7189 Other specified counseling: Secondary | ICD-10-CM | POA: Diagnosis not present

## 2020-03-09 DIAGNOSIS — I63511 Cerebral infarction due to unspecified occlusion or stenosis of right middle cerebral artery: Secondary | ICD-10-CM | POA: Diagnosis not present

## 2020-03-09 DIAGNOSIS — R1312 Dysphagia, oropharyngeal phase: Secondary | ICD-10-CM | POA: Diagnosis not present

## 2020-03-09 DIAGNOSIS — I63429 Cerebral infarction due to embolism of unspecified anterior cerebral artery: Secondary | ICD-10-CM | POA: Diagnosis not present

## 2020-03-09 DIAGNOSIS — I482 Chronic atrial fibrillation, unspecified: Secondary | ICD-10-CM | POA: Diagnosis not present

## 2020-03-09 DIAGNOSIS — Z515 Encounter for palliative care: Secondary | ICD-10-CM | POA: Diagnosis not present

## 2020-03-09 LAB — BASIC METABOLIC PANEL
Anion gap: 8 (ref 5–15)
BUN: 48 mg/dL — ABNORMAL HIGH (ref 8–23)
CO2: 31 mmol/L (ref 22–32)
Calcium: 9.3 mg/dL (ref 8.9–10.3)
Chloride: 111 mmol/L (ref 98–111)
Creatinine, Ser: 0.99 mg/dL (ref 0.61–1.24)
GFR, Estimated: >60 mL/min (ref >60)
Glucose, Bld: 297 mg/dL — ABNORMAL HIGH (ref 70–99)
Potassium: 3.7 mmol/L (ref 3.5–5.1)
Sodium: 150 mmol/L — ABNORMAL HIGH (ref 135–145)

## 2020-03-09 LAB — GLUCOSE, CAPILLARY
Glucose-Capillary: 214 mg/dL — ABNORMAL HIGH (ref 70–99)
Glucose-Capillary: 218 mg/dL — ABNORMAL HIGH (ref 70–99)
Glucose-Capillary: 205 mg/dL — ABNORMAL HIGH (ref 70–99)
Glucose-Capillary: 179 mg/dL — ABNORMAL HIGH (ref 70–99)
Glucose-Capillary: 202 mg/dL — ABNORMAL HIGH (ref 70–99)

## 2020-03-09 LAB — CBC
HCT: 41.6 % (ref 39.0–52.0)
Hemoglobin: 12.8 g/dL — ABNORMAL LOW (ref 13.0–17.0)
MCH: 31.7 pg (ref 26.0–34.0)
MCHC: 30.8 g/dL (ref 30.0–36.0)
MCV: 103 fL — ABNORMAL HIGH (ref 80.0–100.0)
Platelets: 137 10*3/uL — ABNORMAL LOW (ref 150–400)
RBC: 4.04 MIL/uL — ABNORMAL LOW (ref 4.22–5.81)
RDW: 13.2 % (ref 11.5–15.5)
WBC: 7 10*3/uL (ref 4.0–10.5)
nRBC: 0 % (ref 0.0–0.2)

## 2020-03-09 NOTE — Progress Notes (Signed)
  Speech Language Pathology Treatment: Dysphagia  Patient Details Name: Richard Farmer MRN: [id number] DOB: October 07, 1940 Today's Date: 03/09/2020 Time: 5035-4656 SLP Time Calculation (min) (ACUTE ONLY): 15 min  Assessment / Plan / Recommendation Clinical Impression  Pt is lethargic this morning, reactive to stimulation such as a toothbrush and nodding "yes" when asked if he wanted to try some ice, but he makes no attempts to accept the ice chip off the spoon, formulate a bolus, or swallow. SLP utilized oral suction throughout session. Minimal trials were attempted this morning when level of alertness could not be improved, but SLP did provide a small amount of water via spoon in his oral cavity to provide a little moisture. Discussed plan for oral hygiene with RN, as his mouth is very dry and coated despite efforts so far. Will continue to follow.    HPI HPI: 79 y.o. M of atrial fibrillation on coumadin, CAD, carotid stenosis, DM, HL and HTN who presented with L-sided weakness and found to have right ICA cervical occlusion s/p stenting right ICA and unsuccessful thrombectomy of right MCA/M1 and intracranial ICA. Intubated from 11/24-11/26/21.      SLP Plan  Continue with current plan of care       Recommendations  Diet recommendations: NPO Medication Administration: Via alternative means                Oral Care Recommendations: Oral care QID Follow up Recommendations: Inpatient Rehab SLP Visit Diagnosis: Dysphagia, oropharyngeal phase (R13.12) Plan: Continue with current plan of care       GO                Mahala Menghini., M.A. CCC-SLP Acute Rehabilitation Services Pager [phone] Office [phone]  03/09/2020, 9:10 AM

## 2020-03-09 NOTE — Progress Notes (Signed)
Physical Therapy Treatment Patient Details Name: Richard Farmer MRN: [id number] DOB: [birth year] Today's Date: 03/09/2020    History of Present Illness This 79 y.o. male admitted with acute onset Lt sided weakness.  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  CT of head showed large Rt MCA territory infarct, no hemorrhagic transformation, localized edema.  HTN, Gout, DM, CAD, A-Fib, s/p Rt TKA,     PT Comments    Pt so low arousal, that it was difficult to mobilize.  Pt rolled and P/AAROM complete on all 4's and trunk during peri care session for stool.  In lieu of standing and squat-pivot transfer, opted for transfer to chair via sky lift.   Follow Up Recommendations  CIR;SNF;Other (comment) (continue to watch for improvement to finalize plans)     Equipment Recommendations  Wheelchair (measurements PT);Wheelchair cushion (measurements PT)    Recommendations for Other Services       Precautions / Restrictions Precautions Precautions: Fall    Mobility  Bed Mobility Overal bed mobility: Needs Assistance Bed Mobility: Rolling Rolling: Max assist;Total assist         General bed mobility comments: pt needed significant peri care for stool.  Low arousal level continued though out.  Transfers Overall transfer level: Needs assistance               General transfer comment: Aborted squat pivot transfer in favor of sky lift due to pt unable to awaken to participate sufficiently.  Ambulation/Gait             General Gait Details: NT   Stairs             Wheelchair Mobility    Modified Rankin (Stroke Patients Only) Modified Rankin (Stroke Patients Only) Pre-Morbid Rankin Score: No symptoms Modified Rankin: Severe disability     Balance Overall balance assessment: Needs assistance Sitting-balance support: Feet supported Sitting balance-Leahy Scale: Poor Sitting balance -  Comments: pt needed positioning aide in the chair due to continued list to the Left in the chair.                                    Cognition Arousal/Alertness: Lethargic Behavior During Therapy: Flat affect Overall Cognitive Status: Difficult to assess                                 General Comments: pt with low arousal today until end of session.  Difficult to determine cognition.      Exercises Other Exercises Other Exercises: PROM to L U and LE, A/AAROM to R U and LE  withing the peri care session.    General Comments General comments (skin integrity, edema, etc.): vss throughout.  pt stayed at low arousal level thoughout.      Pertinent Vitals/Pain Pain Assessment: Faces Faces Pain Scale: No hurt Pain Intervention(s): Monitored during session    Home Living Family/patient expects to be discharged to:: Unsure                    Prior Function            PT Goals (current goals can now be found in the care plan section) Acute Rehab PT Goals Patient Stated Goal: pt unable to state. PT Goal Formulation:  Patient unable to participate in goal setting Time For Goal Achievement: 03/16/20 Potential to Achieve Goals: Fair Progress towards PT goals: Not progressing toward goals - comment (low arousal today)    Frequency    Min 3X/week      PT Plan Current plan remains appropriate    Co-evaluation              AM-PAC PT "6 Clicks" Mobility   Outcome Measure  Help needed turning from your back to your side while in a flat bed without using bedrails?: Total Help needed moving from lying on your back to sitting on the side of a flat bed without using bedrails?: Total Help needed moving to and from a bed to a chair (including a wheelchair)?: Total Help needed standing up from a chair using your arms (e.g., wheelchair or bedside chair)?: Total Help needed to walk in hospital room?: Total Help needed climbing 3-5 steps with a  railing? : Total 6 Click Score: 6    End of Session Equipment Utilized During Treatment: Oxygen Activity Tolerance: Patient limited by lethargy Patient left: in chair;Other (comment);with nursing/sitter in room;with call bell/phone within reach (on lift pad) Nurse Communication: Mobility status;Need for lift equipment PT Visit Diagnosis: Hemiplegia and hemiparesis Hemiplegia - Right/Left: Left Hemiplegia - dominant/non-dominant: Non-dominant Hemiplegia - caused by: Cerebral infarction     Time: 1720-1748 PT Time Calculation (min) (ACUTE ONLY): 28 min  Charges:  $Therapeutic Activity: 23-37 mins                     03/09/2020  Richard Farmer., PT Acute Rehabilitation Services [phone]  (pager) [phone]  (office)   Richard Farmer 03/09/2020, 6:03 PM

## 2020-03-09 NOTE — Progress Notes (Signed)
STROKE TEAM PROGRESS NOTE   INTERVAL HISTORY No family at bedside.  Patient lying in bed, neurologic condition largely unchanged, slightly more lethargic than yesterday.  Vitals:   03/09/20 0600 03/09/20 0700 03/09/20 0733 03/09/20 0800  BP: 99/72 102/63 102/63   Pulse: 87 91 97   Resp: (!) 24 (!) 24 (!) 24   Temp:    (!) 97.1 F (36.2 C)  TempSrc:    Axillary  SpO2: 95% 98% 99%   Weight:      Height:       CBC:  Recent Labs  Lab 03/08/20 0008 03/09/20 0023  WBC 6.6 7.0  HGB 13.2 12.8*  HCT 40.6 41.6  MCV 100.7* 103.0*  PLT 127* 137*   Basic Metabolic Panel:  Recent Labs  Lab 03/03/20 1651 03/03/20 2205 03/04/20 1040 03/04/20 1040 03/04/20 1626 03/05/20 0610 03/08/20 0008 03/09/20 0023  NA 153*   < > 153*   < > 155*   < > 151* 150*  K  --   --  3.6  --   --    < > 3.7 3.7  CL  --   --  115*  --   --    < > 113* 111  CO2  --   --  28  --   --    < > 28 31  GLUCOSE  --   --  223*  --   --    < > 297* 297*  BUN  --   --  26*  --   --    < > 45* 48*  CREATININE  --   --  1.09  --   --    < > 0.96 0.99  CALCIUM  --   --  8.9  --   --    < > 9.2 9.3  MG 2.0  --  1.7  --  1.9  --   --   --   PHOS 1.8*  --  2.4*  --   --   --   --   --    < > = values in this interval not displayed.   Lipid Panel:  Recent Labs  Lab 03/06/20 0704  TRIG 109   HgbA1c:  No results for input(s): HGBA1C in the last 168 hours. Urine Drug Screen: No results for input(s): LABOPIA, COCAINSCRNUR, LABBENZ, AMPHETMU, THCU, LABBARB in the last 168 hours.  Alcohol Level No results for input(s): ETH in the last 168 hours.  IMAGING past 24 hours No results found.   PHYSICAL EXAM   Temp:  [96.8 F (36 C)-99 F (37.2 C)] 97.1 F (36.2 C) (12/03 0800) Pulse Rate:  [27-97] 97 (12/03 0733) Resp:  [13-32] 24 (12/03 0733) BP: (86-137)/(56-103) 102/63 (12/03 0733) SpO2:  [92 %-100 %] 99 % (12/03 0733)  General - Well nourished, well developed, lethargic.  Ophthalmologic - fundi not  visualized due to noncooperation.  Cardiovascular - irregularly irregular heart rate and rhythm.  Neuro - slightly open eyes with voice today, still lethargic, but able to follow simple commands on the right and midline.  Still no speech output. With eye opening, pt not blinking to visual threat bilaterally, right gaze preference, not able to cross midline. Able to track on the right with eye movement to the right. PERRL. Mild left facial weakness.  Tongue midline mouth.  Motor system exam shows spontaneous purposeful right upper extremity movements barely against gravity, RLE withdraw 3-/5 to pain and moving toes  PF/DF on request.  Left upper and lower extremity hypotonic and slight withdraw respond to painful stimulus. No babinski bilaterally.  Gait not tested   ASSESSMENT/PLAN [name] is a 79 y.o. male with history of AF on warfarin, know ICA stenosis presenting with L sided weakness and hemianopsia. He did not receive tPA.  Taken to IR for emergent stenting of R ICA occlusion.  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 Large vessel disease  CT head no acute abnormality. Possible R forehead scalp hematoma/contusion  CTA head & neck R cervical ICA origin occlusion with reconstitution at terminus. Critical stenosis brachiocephalic artery. Severe stenosis R P2. Multifocal moderate R A2, proximal R P2, severe distal R P2, B mid VA, L VA origin stenoses. B ICA atherosclerosis w/ L ICA web. Severe degenerative C6-7 disc dz.  CT perfusion large penumbra  Cerebral angio / IR - R ICA occlusion from bulb to proximal ICA terminus. Stent R ICA. ICA remains occluded after ICA stent, secondary to occlusion at the supraclinoid segment. Right M1 remains occluded, embolization to new territory.  MRI  R MCA infarct w/ sparing of putamen, globus pallidus  and caudate head. Petechial hemorrhage. No shift. Abnormal flor R ICA w/ partial dissection.  CT Head 11/28 - Continued interval evolution of subacute right MCA territory infarct, stable in size and distribution from previous. Slightly worsened localized edema and partial effacement of the right lateral ventricle, but with no more than trace right-to-left midline shift.  2D Echo EF 60-65%. No source of embolus. LA moderately dilated  LDL 42     HgbA1c 6.3  VTE prophylaxis - Lovenox 40 mg sq daily   aspirin 81 mg daily and warfarin daily prior to admission w/ INR 1.4 on arrival, now on aspirin 81 mg daily and clopidogrel 75 mg daily.   Therapy recommendations:  CIR vs SNF  Disposition:  pending   Family have decided for DNR and NO reintubation. They would elect comfort care with next decline and not to treat any further pneumonia. Not prepared to de-escalate but they would not want long term feeding tube. Keeping Cortrak for now. Palliative care onboard.  Acute Respiratory Failure  Secondary to stroke  Intubated for IR, extubated 03/02/2020  Still have difficulty clearing secretions  Close monitoring respiratory status  CCM sign off   Cytotoxic cerebral edema Induced Hypernatremia  MRI large right MCA infarct with cytotoxic edema and effacement of right lateral ventricle  On 3% at 75cc -> 50cc ->off   Na 155->153->153->151->151->150  Allow Na gradually trending down  CT 11/28 - Continued interval evolution of subacute right MCA territory infarct, stable in size and distribution from previous. Slightly worsened localized edema and partial effacement of the right lateral ventricle, but with no more than trace right-to-left midline shift.  Chronic Atrial Fibrillation on Coumadin with subtherapeutic INR  Home anticoagulation:  warfarin daily + asa 81  Admission INR 1.4 -> 1.5  HR 90-110  On DAPT now  Not an AC candidate at this time d/t large stroke sites and risk of  hemorrhagic transformation    Consider resume anticoagulation in 10 to 14 days post stroke if aggressive care  Aspiration pneumonia LLL  Fever T-max 100.6->100.1->afebrile  Leukocytosis, resolved  WBC 11.9->11.7->9.2->8.2->6.6->7.0  Extubated 11/26 but still have copious secretions  CXR small left pleural effusion and  infiltration or atelectasis in the left base  Off Unasyn 7 day course  Resp Cx neg  CCM signed off  NT suctioning as needed  On Chest PT  Hypertension  Home meds:  Coreg 12.5 bid, losartan 50, Maxzide . BP on the low end . Resume Coreg 12.5->25->12.5 bid . D/c losartan 100 . D/c HCTZ 12.5  . Long-term BP goal normotensive  Hyperlipidemia  Home meds:  lipitor 80 + omega 3  LDL 42, goal < 70  On Lipitor 40  Continue statin on discharge  Diabetes type II Controlled  Home meds - amaryl  HgbA1c 6.3, goal < 7.0  CBGs  SSI  On Lantus 15->20 U daily  novolog 4U ->6U Q4h  Hyperglycemia  DB RN Coordinator following  PCP follow up closely  Dysphagia . Secondary to stroke . NPO . Cortrak on TF @ 65 . Speech on board   Tobacco abuse  Current smoker  Smoking cessation counseling will be provided  Other Stroke Risk Factors  Advanced Age >/= 65   ETOH use 4x wk  Obesity, Body mass index is 30.99 kg/m., BMI >/= 30 associated with increased stroke risk, recommend weight loss, diet and exercise as appropriate   Family hx stroke (father)  Coronary artery disease s/p stent  Other Active Problems  Gout on allopurinal 300 daily  GERD on prilosec  BPH on flomax  Hypokalemia resolved  Hospital day # 9   Marvel Plan, MD PhD Stroke Neurology 03/09/2020 11:14 AM   To contact Stroke Continuity provider, please refer to [email or web address]. After hours, contact General Neurology

## 2020-03-09 NOTE — Progress Notes (Signed)
Palliative:  HPI: 79 y.o.malewith past medical history of atrial fibrillation on warfarin, carotid stenosisadmitted on 11/24/2021with right MCA infarct with left sided weakness.He has extubated successfully but ongoing concern for aspiration risk.   I met today at Richard Farmer bedside. He is very lethargic and does not respond to me today with any nod of head as he has previously. He slept through my assessment. No family at bedside. Breathing a little more labored with diminished bases. Less responsive today. Poor progress with SLP.   I spoke with daughter, Richard Farmer. She reports that the grandchildren plan to be here tomorrow. 4 adult grandchildren (all 65 yo and above) and wife allowed to visit during visitor hours on Saturday 03/10/20 as arranged with nursing staff for exception anticipating decline towards end of life and transition to comfort care in the near future. May alternate 2 at a time at bedside changing out through the day. Hopes that grandchildren may have a visit with [name] while he is still alert and aware and seems oriented.   I discussed further with Richard Farmer that he is more lethargic today and poor progress with SLP. She confirms no desire for PEG placement as previously discussed. We did discuss allowing visitation and the next step being removing Cortrak. Timing of Cortrak removal to be determined. I did explain that if focus of care on comfort and discontinuing feeding tube then we can liberalize visitation for them as well. Richard Farmer verbalizes understanding.   All questions/concerns addressed. Emotional support provided. Discussed with Dr. Erlinda Hong.   Exam: More lethargic. Breathing regular but a little labored, diminished bases. Abd soft. Cortrak in place. L hemiplegia.   Plan: - Liberalized visitation 03/10/20 Saturday.  - Anticipate transition to comfort care in near future. Timing to be decided.   [place], NP Palliative Medicine Team Pager  [phone] (Please see [email or web address] for schedule) Team Phone [phone]    Greater than 50%  of this time was spent counseling and coordinating care related to the above assessment and plan

## 2020-03-09 NOTE — Progress Notes (Signed)
Inpatient Diabetes Program Recommendations  AACE/ADA: New Consensus Statement on Inpatient Glycemic Control (2015)  Target Ranges:  Prepandial:   less than 140 mg/dL      Peak postprandial:   less than 180 mg/dL (1-2 hours)      Critically ill patients:  140 - 180 mg/dL   Lab Results  Component Value Date   GLUCAP 205 (H) 03/09/2020   HGBA1C 6.3 (H) 03/01/2020    Review of Glycemic Control Results for Richard Farmer, Richard Farmer (MRN [id number]) as of 03/09/2020 12:37  Ref. Range 03/08/2020 19:06 03/08/2020 23:10 03/09/2020 03:22 03/09/2020 07:59 03/09/2020 11:55  Glucose-Capillary Latest Ref Range: 70 - 99 mg/dL 757 (H) 972 (H) 820 (H) 218 (H) 205 (H)    Inpatient Diabetes Program Recommendations:     Novolog 8 units Q4H tube feed coverage.  Stop if feeds are held or discontinued.  Will continue to follow while inpatient.  Thank you, Dulce Sellar, RN, BSN Diabetes Coordinator Inpatient Diabetes Program [phone] (team pager from 8a-5p)

## 2020-03-10 ENCOUNTER — Inpatient Hospital Stay (HOSPITAL_COMMUNITY): Payer: Medicare Other

## 2020-03-10 DIAGNOSIS — Z515 Encounter for palliative care: Secondary | ICD-10-CM | POA: Diagnosis not present

## 2020-03-10 DIAGNOSIS — I63511 Cerebral infarction due to unspecified occlusion or stenosis of right middle cerebral artery: Secondary | ICD-10-CM | POA: Diagnosis not present

## 2020-03-10 LAB — BASIC METABOLIC PANEL
Anion gap: 8 (ref 5–15)
BUN: 49 mg/dL — ABNORMAL HIGH (ref 8–23)
CO2: 30 mmol/L (ref 22–32)
Calcium: 9.3 mg/dL (ref 8.9–10.3)
Chloride: 111 mmol/L (ref 98–111)
Creatinine, Ser: 0.95 mg/dL (ref 0.61–1.24)
GFR, Estimated: >60 mL/min (ref >60)
Glucose, Bld: 242 mg/dL — ABNORMAL HIGH (ref 70–99)
Potassium: 3.8 mmol/L (ref 3.5–5.1)
Sodium: 149 mmol/L — ABNORMAL HIGH (ref 135–145)

## 2020-03-10 LAB — GLUCOSE, CAPILLARY
Glucose-Capillary: 151 mg/dL — ABNORMAL HIGH (ref 70–99)
Glucose-Capillary: 162 mg/dL — ABNORMAL HIGH (ref 70–99)
Glucose-Capillary: 163 mg/dL — ABNORMAL HIGH (ref 70–99)
Glucose-Capillary: 238 mg/dL — ABNORMAL HIGH (ref 70–99)
Glucose-Capillary: 260 mg/dL — ABNORMAL HIGH (ref 70–99)
Glucose-Capillary: 88 mg/dL (ref 70–99)

## 2020-03-10 LAB — CBC
HCT: 42.3 % (ref 39.0–52.0)
Hemoglobin: 13.2 g/dL (ref 13.0–17.0)
MCH: 31.7 pg (ref 26.0–34.0)
MCHC: 31.2 g/dL (ref 30.0–36.0)
MCV: 101.7 fL — ABNORMAL HIGH (ref 80.0–100.0)
Platelets: 152 10*3/uL (ref 150–400)
RBC: 4.16 MIL/uL — ABNORMAL LOW (ref 4.22–5.81)
RDW: 13.2 % (ref 11.5–15.5)
WBC: 8.3 10*3/uL (ref 4.0–10.5)
nRBC: 0 % (ref 0.0–0.2)

## 2020-03-10 MED ORDER — SODIUM CHLORIDE 0.9 % IV BOLUS (SEPSIS): 250.0000 mL | Freq: Once | INTRAVENOUS | Status: DC

## 2020-03-10 MED ORDER — MORPHINE SULFATE (CONCENTRATE) 10 MG/0.5ML PO SOLN: 5.0000 mg | ORAL | Status: DC | PRN

## 2020-03-10 MED ORDER — JEVITY 1.2 CAL PO LIQD
1000.0000 mL | ORAL | Status: DC
  Administered 2020-03-10: 1000 mL

## 2020-03-10 MED ORDER — SODIUM CHLORIDE 0.9 % IV SOLN: 2.0000 g | Freq: Three times a day (TID) | INTRAVENOUS | Status: DC

## 2020-03-10 MED ORDER — SCOPOLAMINE 1 MG/3DAYS TD PT72
1.0000 | MEDICATED_PATCH | TRANSDERMAL | Status: DC | PRN
  Filled 2020-03-10: qty 1

## 2020-03-10 MED ORDER — ALBUTEROL SULFATE (2.5 MG/3ML) 0.083% IN NEBU: 2.5000 mg | INHALATION_SOLUTION | RESPIRATORY_TRACT | Status: DC | PRN

## 2020-03-10 MED ORDER — VANCOMYCIN HCL 750 MG/150ML IV SOLN: 750.0000 mg | Freq: Two times a day (BID) | INTRAVENOUS | Status: DC

## 2020-03-10 MED ORDER — SODIUM CHLORIDE 0.9 % IV SOLN: 2.0000 g | Freq: Once | INTRAVENOUS | Status: DC

## 2020-03-10 MED ORDER — VANCOMYCIN HCL 1500 MG/300ML IV SOLN
1500.0000 mg | Freq: Once | INTRAVENOUS | Status: DC
  Filled 2020-03-10: qty 300

## 2020-03-10 MED ORDER — VANCOMYCIN HCL IN DEXTROSE 1-5 GM/200ML-% IV SOLN: 1000.0000 mg | Freq: Once | INTRAVENOUS | Status: DC

## 2020-03-10 NOTE — Progress Notes (Signed)
Pt MEWS noted to be red at the start of the shift. On call physician notified, hourly VS initiated. MD came to bedside & evaluated pt; orders received.   Pt then noted to have apneic episodes while SWOT RN in to see pt, reported to MD. MD spoke with pt's family via phone, pt made palliative/comfort at this time. Sepsis protocol orders halted at that time.

## 2020-03-10 NOTE — Progress Notes (Signed)
Patient developed fever despite scheduled tylenol. Ongoing goals of care discussions but on review he is DNR but not palliative/comfort at this time, though possibly transitioning.   Today's Vitals   03/10/20 2000 03/10/20 2014 03/10/20 2024 03/10/20 2100  BP:  124/87 109/73   Pulse: 93 96 88 83  Resp: (!) 26 (!) 24 (!) 30 (!) 29  Temp:  (!) 100.8 F (38.2 C)    TempSrc:  Rectal    SpO2: 97% 95% 97% 96%  Weight:      Height:      PainSc:  0-No pain     Body mass index is 30.99 kg/m.  Also tachypneaic. HR in the 90s  On exam, he is sleepy but alerts to voice. Able to follow commands (shows me his right thumb). Able to track me to the right. Abdomen non-tender. Perfusing extremities well.   Initially planned to obtain labs, blood cultures, urine culture and urine analysis, sputum culture, CXR and start broad spectrum antibodies to cover presume HAP per sepsis protocol, with 250 cc bolus pending lactate.   Called to update family given the ongoing goals of care discussion.  They requested no interventions be made.  We discussed that in this case his CODE STATUS should be updated to palliative and comfort measures only.  They agreed that this would be his wishes at this time.  I went to let the bedside team know not to obtain the work-up initially ordered.  Was notified that the patient is now having periods of apnea, which I observed at least an 8-second pause.  I updated the family again regarding apneic periods, with no certainty on how long he may take to pass without further intervention.  Updated family the current hospital guidelines allow up to 4 visitors at a time in end-of-life situations.  They expressed appreciation for the updates, and stated they would come to bedside to be with the patient.  40 minutes were spent in the care of this patient, no charge.   Brooke Dare MD-PhD [facility] [phone]

## 2020-03-10 NOTE — Progress Notes (Signed)
Pharmacy Antibiotic Note  Richard Farmer is a 79 y.o. male admitted on 03/02/2020 with a stroke and now with concern for sepsis. Pharmacy has been consulted for Vancomycin and Cefepime dosing.  SCr 0.95, CrCl~60-65 ml/min.   Plan: - Vancomycin 1500 mg IV x 1 followed by 750 mg IV every 12 hours - Cefepime 2g IV every 8 hours - Will continue to follow renal function, culture results, LOT, and antibiotic de-escalation plans    Height: 5\' 6"  (167.6 cm) Weight: 87.1 kg (192 lb 0.3 oz) IBW/kg (Calculated) : 63.8  Temp (24hrs), Avg:98.6 F (37 C), Min:97.7 F (36.5 C), Max:100.8 F (38.2 C)  Recent Labs  Lab 03/06/20 0704 03/07/20 0359 03/08/20 0008 03/09/20 0023 03/10/20 0202  WBC 9.2 8.2 6.6 7.0 8.3  CREATININE 0.97 1.07 0.96 0.99 0.95    Estimated Creatinine Clearance: 64.1 mL/min (by C-G formula based on SCr of 0.95 mg/dL).    Allergies  Allergen Reactions  . Ibuprofen Other (See Comments)    Stomach irritation     Antimicrobials this admission: Unasyn 11/27 >> 12/2 Vanc 12/4 >> Cefepime 12/4 >>  Dose adjustments this admission: n/a  Microbiology results: 11/24 Flu/COVID >> neg 11/24 MRSA PCR >> neg 11/28 >> NOF 12/4 RCx >> 12/4 BCx >> 12/4 UCx >>  Thank you for allowing pharmacy to be a part of this patient's care.  14/4, PharmD, BCPS Clinical Pharmacist Clinical phone for 03/10/2020: [phone] 03/10/2020 10:22 PM   **Pharmacist phone directory can now be found on [email or web address] ([email or web address]).  Listed under [facility].

## 2020-03-10 NOTE — Progress Notes (Signed)
Family at bedside. 

## 2020-03-10 NOTE — Progress Notes (Signed)
STROKE TEAM PROGRESS NOTE   INTERVAL HISTORY Patient is sitting up in bed.  His 2 granddaughters at the bedside.  After rounds return to speak to his son and daughter as well.  He remains drowsy but can be aroused.  He follows simple midline and one-step commands on the right side.  He moves right side spontaneously but remains plegic on the left.  Family still struggling with decisions on goals of care as patient's wife feels feeding tube and present care should continue with his children and feel patient would want full comfort care  Vitals:   03/10/20 0734 03/10/20 0833 03/10/20 1206 03/10/20 1228  BP: 124/90  112/60 112/60  Pulse:      Resp:      Temp: 98 F (36.7 C)  97.8 F (36.6 C) 97.8 F (36.6 C)  TempSrc:      SpO2:  97%    Weight:      Height:       CBC:  Recent Labs  Lab 03/09/20 0023 03/10/20 0202  WBC 7.0 8.3  HGB 12.8* 13.2  HCT 41.6 42.3  MCV 103.0* 101.7*  PLT 137* 152   Basic Metabolic Panel:  Recent Labs  Lab 03/03/20 1651 03/03/20 2205 03/04/20 1040 03/04/20 1040 03/04/20 1626 03/05/20 0610 03/09/20 0023 03/10/20 0202  NA 153*   < > 153*   < > 155*   < > 150* 149*  K  --   --  3.6  --   --    < > 3.7 3.8  CL  --   --  115*  --   --    < > 111 111  CO2  --   --  28  --   --    < > 31 30  GLUCOSE  --   --  223*  --   --    < > 297* 242*  BUN  --   --  26*  --   --    < > 48* 49*  CREATININE  --   --  1.09  --   --    < > 0.99 0.95  CALCIUM  --   --  8.9  --   --    < > 9.3 9.3  MG 2.0  --  1.7  --  1.9  --   --   --   PHOS 1.8*  --  2.4*  --   --   --   --   --    < > = values in this interval not displayed.   Lipid Panel:  Recent Labs  Lab 03/06/20 0704  TRIG 109   HgbA1c:  No results for input(s): HGBA1C in the last 168 hours. Urine Drug Screen: No results for input(s): LABOPIA, COCAINSCRNUR, LABBENZ, AMPHETMU, THCU, LABBARB in the last 168 hours.  Alcohol Level No results for input(s): ETH in the last 168 hours.  IMAGING past 24  hours No results found.   PHYSICAL EXAM    Temp:  [97.7 F (36.5 C)-98.3 F (36.8 C)] 97.8 F (36.6 C) (12/04 1228) Pulse Rate:  [64-100] 100 (12/04 0335) Resp:  [19-50] 19 (12/04 0335) BP: (102-132)/(60-90) 112/60 (12/04 1228) SpO2:  [97 %-100 %] 97 % (12/04 0833)  General - Well nourished, well developed, elderly Caucasian male.Marland Kitchen  Ophthalmologic - fundi not visualized due to noncooperation.  Cardiovascular - irregularly irregular heart rate and rhythm.  Neuro -  , still lethargic, but able to open  eyes and follow simple commands on the right and midline.  Still no speech output. With eye opening, pt not blinking to visual threat bilaterally, right gaze preference, not able to cross midline. Able to track on the right with eye movement to the right. PERRL. Mild left facial weakness.  Tongue midline mouth.  Motor system exam shows spontaneous purposeful right upper extremity movements barely against gravity, RLE withdraw 3-/5 to pain and moving toes PF/DF on request.  Left upper and lower extremity hypotonic and slight withdraw respond to painful stimulus. No babinski bilaterally.  Gait not tested   ASSESSMENT/PLAN [name] is a 79 y.o. male with history of AF on warfarin, know ICA stenosis presenting with L sided weakness and hemianopsia. He did not receive tPA.  Taken to IR for emergent stenting of R ICA occlusion.  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 Large vessel disease  CT head no acute abnormality. Possible R forehead scalp hematoma/contusion  CTA head & neck R cervical ICA origin occlusion with reconstitution at terminus. Critical stenosis brachiocephalic artery. Severe stenosis R P2. Multifocal moderate R A2, proximal R P2, severe distal R P2, B mid VA, L VA origin stenoses. B ICA atherosclerosis w/ L ICA web.  Severe degenerative C6-7 disc dz.  CT perfusion large penumbra  Cerebral angio / IR - R ICA occlusion from bulb to proximal ICA terminus. Stent R ICA. ICA remains occluded after ICA stent, secondary to occlusion at the supraclinoid segment. Right M1 remains occluded, embolization to new territory.  MRI  R MCA infarct w/ sparing of putamen, globus pallidus and caudate head. Petechial hemorrhage. No shift. Abnormal flor R ICA w/ partial dissection.  CT Head 11/28 - Continued interval evolution of subacute right MCA territory infarct, stable in size and distribution from previous. Slightly worsened localized edema and partial effacement of the right lateral ventricle, but with no more than trace right-to-left midline shift.  2D Echo EF 60-65%. No source of embolus. LA moderately dilated  LDL 42     HgbA1c 6.3  VTE prophylaxis - Lovenox 40 mg sq daily   aspirin 81 mg daily and warfarin daily prior to admission w/ INR 1.4 on arrival, now on aspirin 81 mg daily and clopidogrel 75 mg daily.   Therapy recommendations:  CIR vs SNF  Disposition:  pending   Family have decided for DNR and NO reintubation. They would elect comfort care with next decline and not to treat any further pneumonia. Not prepared to de-escalate but they would not want long term feeding tube. Keeping Cortrak for now. Palliative care onboard.  Acute Respiratory Failure  Secondary to stroke  Intubated for IR, extubated 03/02/2020  Still have difficulty clearing secretions  Close monitoring respiratory status  CCM sign off   Cytotoxic cerebral edema Induced Hypernatremia  MRI large right MCA infarct with cytotoxic edema and effacement of right lateral ventricle  On 3% at 75cc -> 50cc ->off   Na 155->153->153->151->151->150->149  Allow Na gradually trending down  CT 11/28 - Continued interval evolution of subacute right MCA territory infarct, stable in size and distribution from previous. Slightly worsened  localized edema and partial effacement of the right lateral ventricle, but with no more than trace right-to-left midline shift.  Chronic Atrial Fibrillation on Coumadin with subtherapeutic INR  Home anticoagulation:  warfarin daily + asa  81  Admission INR 1.4 -> 1.5  HR 90-110  On DAPT now  Not an AC candidate at this time d/t large stroke sites and risk of hemorrhagic transformation    Consider resume anticoagulation in 10 to 14 days post stroke if aggressive care  Aspiration pneumonia LLL  Fever T-max 100.6->100.1->afebrile  Leukocytosis, resolved  WBC 11.9->11.7->9.2->8.2->6.6->7.0->8.3  Extubated 11/26 but still have copious secretions  CXR small left pleural effusion and infiltration or atelectasis in the left base  Off Unasyn 7 day course  Resp Cx neg  CCM signed off  NT suctioning as needed  On Chest PT  Hypertension  Home meds:  Coreg 12.5 bid, losartan 50, Maxzide . BP on the low end . Resume Coreg 12.5->25->12.5 bid . D/c losartan 100 . D/c HCTZ 12.5  . Long-term BP goal normotensive  Hyperlipidemia  Home meds:  lipitor 80 + omega 3  LDL 42, goal < 70  On Lipitor 40  Continue statin on discharge  Diabetes type II Controlled  Home meds - amaryl  HgbA1c 6.3, goal < 7.0  CBGs  SSI  On Lantus 15->20 U daily  (dose adjusted 03/06/20)  novolog 4U ->6U Q4h (dose adjusted Friday 12/3)  Hyperglycemia  DB RN Coordinator following  PCP follow up closely  Dysphagia . Secondary to stroke . NPO . Cortrak on TF @ 65 . Speech on board   Tobacco abuse  Current smoker  Smoking cessation counseling will be provided  Palliative Care Consult 12/1/202 SUMMARY OF RECOMMENDATIONS    DNR decided   Comfort care with further decline  Ongoing goals of care conversations  Other Stroke Risk Factors  Advanced Age >/= 65   ETOH use 4x wk  Obesity, Body mass index is 30.99 kg/m., BMI >/= 30 associated with increased stroke risk,  recommend weight loss, diet and exercise as appropriate   Family hx stroke (father)  Coronary artery disease s/p stent  Other Active Problems  Gout on allopurinal 300 daily  GERD on prilosec  BPH on flomax  Hypokalemia resolved - 3.8  Patient clearly is not doing well with significant neurological deficits from his stroke and is unlikely to survive without PEG tube and prolonged nursing home care.  Patient is DNR but there is slight conflict between the patient's wife and children's plan for his care.  I offered to speak to the patient's wife when she arrives tomorrow to clarify the goal is family seems to be leaning towards full comfort care.  Greater than 50% time during this 35-minute visit was spent on counseling and coordination of care and discussion with patient's multiple family members and care team and answering questions. Delia Heady, MD Hospital day # 10     To contact Stroke Continuity provider, please refer to [email or web address]. After hours, contact General Neurology

## 2020-03-10 NOTE — Progress Notes (Signed)
Sepsis protocol has been D/C with elink no further monitoring

## 2020-03-10 NOTE — Progress Notes (Signed)
Daily Progress Note   Patient Name: Richard Farmer       Date: 03/10/2020 DOB: [birth year]  Age: 79 y.o. MRN#: [id number] Attending Physician: Marvel Plan, MD Primary Care Physician: Patient, No Pcp Per Admit Date: 03-07-20  Reason for Consultation/Follow-up: To discuss complex medical decision making related to patient's goals of care Discussed with bedside RN.  Patient showing signs of decline.  Less interaction today.   Breathing more labored.  Subjective: Spoke with wife on the phone.  She (rightfully so) is very interested in her family from out of state visiting Richard Farmer while he is able to interact with them.  She wants the grandchildren to be able to see him "one last time".    I spoke with her again this evening.  The family had gathered together and they were sharing their thoughts of what they had seen on their respective visits today.  Richard Farmer shared that he felt his father was working hard to open his eyes and recognize the grandchildren.   The family shared that Dr. Pearlean Brownie had asked them about shifting to comfort today but that they were not ready to make a decision.  Family recognizes the patient's decline.  I asked Richard Farmer if I could meet with her tomorrow.  I explained that I was going to ask some tough questions and asked if she wanted to bring any family with her for support and help in making decisions.  She replied that yes she wanted to bring her two daughters.  We planned to meet at 3:00 pm so that he son's flight would be complete and he could join [place] by phone.   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  Now extubated after a failed stenting attempt.  He is failing to thrive.  Becoming progressively more lethargic and less interactive.  His breathing is becoming more  labored.   He is eligible for Hospice facility.   Patient Profile/HPI:  79 y.o. 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 He has extubated successfully but ongoing concern for aspiration risk.     Length of Stay: 10   Vital Signs: BP 124/90 (BP Location: Right Arm)   Pulse 100   Temp 98 F (36.7  C)   Resp 19   Ht 5\' 6"  (1.676 m)   Wt 87.1 kg   SpO2 97%   BMI 30.99 kg/m  SpO2: SpO2: 97 % O2 Device: O2 Device: Nasal Cannula O2 Flow Rate: O2 Flow Rate (L/min): 2 L/min       Palliative Assessment/Data: 10%     Palliative Care Plan    Recommendations/Plan:  Out of state family visited (grand children).  PMT meeting with wife and children on 12/5 at 3:00 to hopefully finalize decisions.    I feel he is currently eligible for Hospice facility but quickly will decline to a hospital death.  Due to labored breathing his artificial feeds were cut back today.     RN called to report CBGs were declining rapidly - stopped insulin other than sliding scale.  I'm very concerned he has started the process of actively dying.  Code Status:  DNR  Prognosis:   Hours - Days   Discharge Planning:  To Be Determined  Care plan was discussed with family and bedside RN.  Thank you for allowing the Palliative Medicine Team to assist in the care of this patient.  Total time spent:  35 min.     Greater than 50%  of this time was spent counseling and coordinating care related to the above assessment and plan.  14/5, PA-C Palliative Medicine  Please contact Palliative MedicineTeam phone at [phone] for questions and concerns between 7 am - 7 pm.   Please see AMION for individual provider pager numbers.

## 2020-03-11 DIAGNOSIS — I63511 Cerebral infarction due to unspecified occlusion or stenosis of right middle cerebral artery: Secondary | ICD-10-CM | POA: Diagnosis not present

## 2020-03-11 DIAGNOSIS — Z515 Encounter for palliative care: Secondary | ICD-10-CM | POA: Diagnosis not present

## 2020-03-11 MED ORDER — GLYCOPYRROLATE 0.2 MG/ML IJ SOLN: 0.2000 mg | INTRAMUSCULAR | Status: DC | PRN

## 2020-03-11 MED ORDER — ACETAMINOPHEN 650 MG RE SUPP: 650.0000 mg | Freq: Four times a day (QID) | RECTAL | Status: DC | PRN

## 2020-03-11 MED ORDER — ONDANSETRON 4 MG PO TBDP: 4.0000 mg | ORAL_TABLET | Freq: Four times a day (QID) | ORAL | Status: DC | PRN

## 2020-03-11 MED ORDER — LORAZEPAM 1 MG PO TABS: 1.0000 mg | ORAL_TABLET | ORAL | Status: DC | PRN

## 2020-03-11 MED ORDER — HALOPERIDOL LACTATE 2 MG/ML PO CONC
0.5000 mg | ORAL | Status: DC | PRN
  Filled 2020-03-11: qty 0.3

## 2020-03-11 MED ORDER — ACETAMINOPHEN 10 MG/ML IV SOLN
1000.0000 mg | Freq: Four times a day (QID) | INTRAVENOUS | Status: AC
  Administered 2020-03-11 – 2020-03-12 (×4): 1000 mg via INTRAVENOUS
  Filled 2020-03-11 (×4): qty 100

## 2020-03-11 MED ORDER — LORAZEPAM 2 MG/ML IJ SOLN
1.0000 mg | Freq: Four times a day (QID) | INTRAMUSCULAR | Status: DC
  Administered 2020-03-11: 1 mg via INTRAVENOUS
  Filled 2020-03-11: qty 1

## 2020-03-11 MED ORDER — FUROSEMIDE 10 MG/ML IJ SOLN
20.0000 mg | Freq: Once | INTRAMUSCULAR | Status: AC
  Administered 2020-03-11: 20 mg via INTRAVENOUS
  Filled 2020-03-11: qty 4

## 2020-03-11 MED ORDER — BIOTENE DRY MOUTH MT LIQD: 15.0000 mL | OROMUCOSAL | Status: DC | PRN

## 2020-03-11 MED ORDER — LORAZEPAM 2 MG/ML PO CONC: 1.0000 mg | ORAL | Status: DC | PRN

## 2020-03-11 MED ORDER — HALOPERIDOL 0.5 MG PO TABS: 0.5000 mg | ORAL_TABLET | ORAL | Status: DC | PRN

## 2020-03-11 MED ORDER — ACETAMINOPHEN 325 MG PO TABS: 650.0000 mg | ORAL_TABLET | Freq: Four times a day (QID) | ORAL | Status: DC | PRN

## 2020-03-11 MED ORDER — GLYCOPYRROLATE 1 MG PO TABS
1.0000 mg | ORAL_TABLET | ORAL | Status: DC | PRN
  Filled 2020-03-11: qty 1

## 2020-03-11 MED ORDER — MORPHINE 100MG IN NS 100ML (1MG/ML) PREMIX INFUSION
5.0000 mg/h | INTRAVENOUS | Status: DC
  Administered 2020-03-11: 5 mg/h via INTRAVENOUS
  Filled 2020-03-11 (×3): qty 100

## 2020-03-11 MED ORDER — POLYVINYL ALCOHOL 1.4 % OP SOLN
1.0000 [drp] | Freq: Four times a day (QID) | OPHTHALMIC | Status: DC | PRN
  Filled 2020-03-11: qty 15

## 2020-03-11 MED ORDER — GLYCOPYRROLATE 0.2 MG/ML IJ SOLN
0.4000 mg | Freq: Three times a day (TID) | INTRAMUSCULAR | Status: DC
  Administered 2020-03-11 (×2): 0.4 mg via INTRAVENOUS
  Filled 2020-03-11 (×3): qty 2

## 2020-03-11 MED ORDER — LORAZEPAM 2 MG/ML IJ SOLN: 1.0000 mg | INTRAMUSCULAR | Status: DC | PRN

## 2020-03-11 MED ORDER — ONDANSETRON HCL 4 MG/2ML IJ SOLN: 4.0000 mg | Freq: Four times a day (QID) | INTRAMUSCULAR | Status: DC | PRN

## 2020-03-11 MED ORDER — MORPHINE BOLUS VIA INFUSION
2.0000 mg | INTRAVENOUS | Status: DC | PRN
  Filled 2020-03-11: qty 2

## 2020-03-11 MED ORDER — HALOPERIDOL LACTATE 5 MG/ML IJ SOLN: 0.5000 mg | INTRAMUSCULAR | Status: DC | PRN

## 2020-03-11 MED ORDER — LORAZEPAM 2 MG/ML IJ SOLN
0.5000 mg | Freq: Once | INTRAMUSCULAR | Status: AC
  Administered 2020-03-11: 0.5 mg via INTRAVENOUS
  Filled 2020-03-11: qty 1

## 2020-03-11 NOTE — Progress Notes (Addendum)
Daily Progress Note   Patient Name: Richard Farmer       Date: 03/11/2020 DOB: April 26, 1940  Age: 79 y.o. MRN#: [id number] Attending Physician: Garvin Fila, MD Primary Care Physician: Patient, No Pcp Per Admit Date: 02/22/2020  Reason for Consultation/Follow-up: To discuss complex medical decision making related to patient's goals of care.  Symptom management.  Discussed with RN Aaron Edelman who is caring for patient.  Subjective: Family member came to our office at approximately 8:00 am requesting comfort medications. Events of last night noted.  Patient developed a fever and prompted code sepsis.  Appreciate Neurology's gentle conversation with the family last night shifting the patient to comfort measures. Discussed employing further comfort measures with the family. Explained his prognosis is likely hours to days. He will not be able to transport outside of the hospital at this point. If he stabilizes we will discuss [place] with the family. Plan made to meet family at bedside at 10 am this morning.  Met with family later in the morning.  Patient appears comfortable.   Discussed comfort medications and prognosis.  Family expressed a desire to focus completely on his comfort and not prolong his dying process. Dr. Leonie Man came to see the patient.  He also felt patient's prognosis was hours to days.  He encouraged a day by day assessment.  Family expressed concern about patient's daughter Colletta [place].  She is out of her BP medication (Labetalol 300 mg bid) and has been for several days.  Her regular pharmacy in [place] is [facility]. Took Stephanie's BP 197/95.  Worked with family to find a [facility] close by and encouraged Colletta Maryland to either go to the [facility] to obtain a refill or go to an Urgent  Care for an exam and new prescription.   Assessment: Patient is febrile, actively dying. Diaphoretic, mildly agitated.  Breathing does not appear as labored this am.  Cor trak still in place.    Patient Profile/HPI:  79 y.o.malewith past medical history of atrial fibrillation on warfarin, carotid stenosisadmitted on 11/24/2021with right MCA infarct with left sided weakness.He has extubated successfully but ongoing concern for aspiration risk.Developed lethargy over the next several days and declined.  12/5 actively dying.    Length of Stay: 11   Vital Signs: BP (!) 132/93 (BP Location: Right Arm)  Pulse 93   Temp 98.8 F (37.1 C) (Axillary)   Resp (!) 22   Ht _0  (1.676 m)   Wt 87.1 kg   SpO2 95%   BMI 30.99 kg/m  SpO2: SpO2: 95 % O2 Device: O2 Device: Room Air O2 Flow Rate: O2 Flow Rate (L/min): 2 L/min       Palliative Assessment/Data: 10%     Palliative Care Plan    Recommendations/Plan:  Full comfort measures.  Morphine gtt started.  PRN ativan.  Robinul.  Code Status:  DNR  Prognosis:   Hours - Days   Discharge Planning:  Anticipated Hospital Death - will re-discuss [place] with family if he stabilizes.  Care plan was discussed with son Lennette Bihari and RN Aaron Edelman.  Thank you for allowing the Palliative Medicine Team to assist in the care of this patient.  Total time spent:  35 min.     Greater than 50%  of this time was spent counseling and coordinating care related to the above assessment and plan.  Florentina Jenny, PA-C Palliative Medicine  Please contact Palliative MedicineTeam phone at [phone] for questions and concerns between 7 am - 7 pm.   Please see AMION for individual provider pager numbers.

## 2020-03-11 NOTE — Progress Notes (Signed)
Mouth care performed.

## 2020-03-11 NOTE — Progress Notes (Signed)
STROKE TEAM PROGRESS NOTE   INTERVAL HISTORY Multiple family members including son and wife are in the room along with palliative care nurse practitioner.  Patient's condition declined yesterday and developed fever with tachycardia and tachypnea family decided on full comfort care measures.  Patient is presently unresponsive but resting peacefully.  Vitals:   03/10/20 2024 03/10/20 2100 03/10/20 2123 03/10/20 2214  BP: 109/73  (!) 132/93 (!) 132/93  Pulse: 88 83 85 93  Resp: (!) 30 (!) 29 (!) 22   Temp:      TempSrc:      SpO2: 97% 96% 98% 95%  Weight:      Height:       CBC:  Recent Labs  Lab 03/09/20 0023 03/10/20 0202  WBC 7.0 8.3  HGB 12.8* 13.2  HCT 41.6 42.3  MCV 103.0* 101.7*  PLT 137* 152   Basic Metabolic Panel:  Recent Labs  Lab 03/04/20 1040 03/04/20 1040 03/04/20 1626 03/05/20 0610 03/09/20 0023 03/10/20 0202  NA 153*   < > 155*   < > 150* 149*  K 3.6  --   --    < > 3.7 3.8  CL 115*  --   --    < > 111 111  CO2 28  --   --    < > 31 30  GLUCOSE 223*  --   --    < > 297* 242*  BUN 26*  --   --    < > 48* 49*  CREATININE 1.09  --   --    < > 0.99 0.95  CALCIUM 8.9  --   --    < > 9.3 9.3  MG 1.7  --  1.9  --   --   --   PHOS 2.4*  --   --   --   --   --    < > = values in this interval not displayed.   Lipid Panel:  Recent Labs  Lab 03/06/20 0704  TRIG 109   HgbA1c:  No results for input(s): HGBA1C in the last 168 hours. Urine Drug Screen: No results for input(s): LABOPIA, COCAINSCRNUR, LABBENZ, AMPHETMU, THCU, LABBARB in the last 168 hours.  Alcohol Level No results for input(s): ETH in the last 168 hours.  IMAGING past 24 hours No results found.   PHYSICAL EXAM    Temp:  [97.8 F (36.6 C)-100.8 F (38.2 C)] 100.8 F (38.2 C) (12/04 2014) Pulse Rate:  [83-96] 93 (12/04 2214) Resp:  [22-30] 22 (12/04 2123) BP: (109-134)/(60-93) 132/93 (12/04 2214) SpO2:  [95 %-98 %] 95 % (12/04 2214)  General - Well nourished, well developed,  elderly Caucasian male.Marland Kitchen  Ophthalmologic - fundi not visualized due to noncooperation.  Cardiovascular - irregularly irregular heart rate and rhythm.  Neuro -  , still lethargic, barely able to open eyes and not following any commands now.  Still no speech output. With eye opening, pt not blinking to visual threat bilaterally, right gaze preference, not able to cross midline.  PERRL. Mild left facial weakness.  Tongue midline mouth.  Motor system exam shows slight purposeful right upper extremity movements barely against gravity, RLE withdraw 3-/5 to pain and moving toes PF/DF on request.  Left upper and lower extremity hypotonic and slight withdraw respond to painful stimulus. No babinski bilaterally.  Gait not tested   ASSESSMENT/PLAN [name] is a 79 y.o. male with history of AF on warfarin, know ICA stenosis presenting with L  sided weakness and hemianopsia. He did not receive tPA.  Taken to IR for emergent stenting of R ICA occlusion.  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 Large vessel disease  CT head no acute abnormality. Possible R forehead scalp hematoma/contusion  CTA head & neck R cervical ICA origin occlusion with reconstitution at terminus. Critical stenosis brachiocephalic artery. Severe stenosis R P2. Multifocal moderate R A2, proximal R P2, severe distal R P2, B mid VA, L VA origin stenoses. B ICA atherosclerosis w/ L ICA web. Severe degenerative C6-7 disc dz.  CT perfusion large penumbra  Cerebral angio / IR - R ICA occlusion from bulb to proximal ICA terminus. Stent R ICA. ICA remains occluded after ICA stent, secondary to occlusion at the supraclinoid segment. Right M1 remains occluded, embolization to new territory.  MRI  R MCA infarct w/ sparing of putamen, globus pallidus and caudate head. Petechial hemorrhage. No  shift. Abnormal flor R ICA w/ partial dissection.  CT Head 11/28 - Continued interval evolution of subacute right MCA territory infarct, stable in size and distribution from previous. Slightly worsened localized edema and partial effacement of the right lateral ventricle, but with no more than trace right-to-left midline shift.  2D Echo EF 60-65%. No source of embolus. LA moderately dilated  LDL 42     HgbA1c 6.3  VTE prophylaxis - Lovenox 40 mg sq daily   aspirin 81 mg daily and warfarin daily prior to admission w/ INR 1.4 on arrival, now on aspirin 81 mg daily and clopidogrel 75 mg daily.   Therapy recommendations:  CIR vs SNF  Disposition:  pending   Family have decided for DNR and NO reintubation. They would elect comfort care with next decline and not to treat any further pneumonia. Not prepared to de-escalate but they would not want long term feeding tube. Keeping Cortrak for now. Palliative care onboard.  Acute Respiratory Failure  Secondary to stroke  Intubated for IR, extubated 03/02/2020  Still have difficulty clearing secretions  Close monitoring respiratory status  CCM sign off   Cytotoxic cerebral edema Induced Hypernatremia  MRI large right MCA infarct with cytotoxic edema and effacement of right lateral ventricle  On 3% at 75cc -> 50cc ->off   Na 155->153->153->151->151->150->149  Allow Na gradually trending down  CT 11/28 - Continued interval evolution of subacute right MCA territory infarct, stable in size and distribution from previous. Slightly worsened localized edema and partial effacement of the right lateral ventricle, but with no more than trace right-to-left midline shift.  Chronic Atrial Fibrillation on Coumadin with subtherapeutic INR  Home anticoagulation:  warfarin daily + asa 81  Admission INR 1.4 -> 1.5  HR 90-110  On DAPT now  Not an AC candidate at this time d/t large stroke sites and risk of hemorrhagic transformation     Consider resume anticoagulation in 10 to 14 days post stroke if aggressive care  Aspiration pneumonia LLL  Fever T-max 100.6->100.1->afebrile  Leukocytosis, resolved  WBC 11.9->11.7->9.2->8.2->6.6->7.0->8.3  Extubated 11/26 but still have copious secretions  CXR small left pleural effusion and infiltration or atelectasis in the left base  Off Unasyn 7 day course  Resp Cx neg  CCM signed off  NT suctioning as needed  On Chest PT  Hypertension  Home meds:  Coreg 12.5 bid, losartan 50, Maxzide . BP on the low  end . Resume Coreg 12.5->25->12.5 bid . D/c losartan 100 . D/c HCTZ 12.5  . Long-term BP goal normotensive  Hyperlipidemia  Home meds:  lipitor 80 + omega 3  LDL 42, goal < 70  On Lipitor 40  Continue statin on discharge  Diabetes type II Controlled  Home meds - amaryl  HgbA1c 6.3, goal < 7.0  CBGs  SSI  On Lantus 15->20 U daily  (dose adjusted 03/06/20)  novolog 4U ->6U Q4h (dose adjusted Friday 12/3)  Hyperglycemia  DB RN Coordinator following  PCP follow up closely  Dysphagia . Secondary to stroke . NPO . Cortrak on TF @ 65 . Speech on board   Tobacco abuse  Current smoker  Smoking cessation counseling will be provided  Palliative Care Consult 12/1/202 SUMMARY OF RECOMMENDATIONS    DNR decided   Comfort care with further decline  Ongoing goals of care conversations  Other Stroke Risk Factors  Advanced Age >/= 65   ETOH use 4x wk  Obesity, Body mass index is 30.99 kg/m., BMI >/= 30 associated with increased stroke risk, recommend weight loss, diet and exercise as appropriate   Family hx stroke (father)  Coronary artery disease s/p stent  Other Active Problems  Gout on allopurinal 300 daily  GERD on prilosec  BPH on flomax  Hypokalemia resolved - 3.8  Patient clearly is not doing well with significant neurological deficits from his stroke and is unlikely to survive without PEG tube and prolonged nursing  home care.  Patient is DNR and family has now decided on full comfort care measures only which I think is appropriate.  Appreciate help from palliative care team.  Anticipate patient passing away in the next 1 to 2 days but if condition stabilizes may consider hospice nursing home.  Long discussion with wife and multiple family members and answered questions..  Greater than 50% time during this 25-minute visit was spent on counseling and coordination of care and discussion with patient's multiple family members and care team and answering questions. Delia Heady, MD Hospital day # 11     To contact Stroke Continuity provider, please refer to [email or web address]. After hours, contact General Neurology

## 2020-03-12 MED ORDER — ACETAMINOPHEN 10 MG/ML IV SOLN
1000.0000 mg | Freq: Four times a day (QID) | INTRAVENOUS | Status: DC
  Filled 2020-03-12 (×4): qty 100

## 2020-03-13 DIAGNOSIS — E119 Type 2 diabetes mellitus without complications: Secondary | ICD-10-CM

## 2020-03-13 DIAGNOSIS — K219 Gastro-esophageal reflux disease without esophagitis: Secondary | ICD-10-CM

## 2020-03-13 DIAGNOSIS — J9600 Acute respiratory failure, unspecified whether with hypoxia or hypercapnia: Secondary | ICD-10-CM | POA: Diagnosis not present

## 2020-03-13 DIAGNOSIS — I2510 Atherosclerotic heart disease of native coronary artery without angina pectoris: Secondary | ICD-10-CM | POA: Diagnosis present

## 2020-03-13 DIAGNOSIS — F17200 Nicotine dependence, unspecified, uncomplicated: Secondary | ICD-10-CM | POA: Diagnosis present

## 2020-03-13 DIAGNOSIS — E669 Obesity, unspecified: Secondary | ICD-10-CM | POA: Diagnosis present

## 2020-03-13 DIAGNOSIS — T45515A Adverse effect of anticoagulants, initial encounter: Secondary | ICD-10-CM | POA: Diagnosis present

## 2020-03-13 DIAGNOSIS — I4891 Unspecified atrial fibrillation: Secondary | ICD-10-CM | POA: Diagnosis present

## 2020-03-13 DIAGNOSIS — E870 Hyperosmolality and hypernatremia: Secondary | ICD-10-CM | POA: Diagnosis not present

## 2020-03-13 DIAGNOSIS — E785 Hyperlipidemia, unspecified: Secondary | ICD-10-CM | POA: Diagnosis present

## 2020-03-13 DIAGNOSIS — Z823 Family history of stroke: Secondary | ICD-10-CM

## 2020-03-13 DIAGNOSIS — G936 Cerebral edema: Secondary | ICD-10-CM | POA: Diagnosis not present

## 2020-03-13 DIAGNOSIS — I6521 Occlusion and stenosis of right carotid artery: Secondary | ICD-10-CM | POA: Diagnosis not present

## 2020-03-13 DIAGNOSIS — I10 Essential (primary) hypertension: Secondary | ICD-10-CM | POA: Diagnosis present

## 2020-03-13 DIAGNOSIS — D6832 Hemorrhagic disorder due to extrinsic circulating anticoagulants: Secondary | ICD-10-CM | POA: Diagnosis present

## 2020-03-13 DIAGNOSIS — J690 Pneumonitis due to inhalation of food and vomit: Secondary | ICD-10-CM | POA: Diagnosis not present

## 2020-04-07 NOTE — Progress Notes (Signed)
RN reported to room for patient assessment. Patient actively dying on comfort care, DNR, on continuous morphine drip. Pt found to be in slow agonal breathing rhythm less than 1 bpm, pale with cyanosis of the lips. Family (wife and 3 children) at bedside comforting patient. Patient's breathing stopped spontaneously.  Charge nurse notified. Second RN called at bedside to pronounce death per orders. Patient assessed by two RN's for signs of life. Patient time of death pounounced at 1435. MD notified. Comfort and support given to family members.

## 2020-04-07 NOTE — Death Summary Note (Signed)
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   Acute respiratory failure (HCC)   Cytotoxic cerebral edema (HCC)   induced Hypernatremia   Atrial fibrillation (HCC)   Warfarin-induced coagulopathy (HCC)   Aspiration pneumonia (HCC)   Hypertension   Hyperlipidemia   Diabetes mellitus (HCC)   CAD (coronary artery disease)   Tobacco use disorder   Family hx-stroke   Obesity   GERD (gastroesophageal reflux disease)   LABORATORY STUDIES CBC    Component Value Date/Time   WBC 8.3 03/10/2020 0202   RBC 4.16 (L) 03/10/2020 0202   HGB 13.2 03/10/2020 0202   HCT 42.3 03/10/2020 0202   PLT 152 03/10/2020 0202   MCV 101.7 (H) 03/10/2020 0202   MCH 31.7 03/10/2020 0202   MCHC 31.2 03/10/2020 0202   RDW 13.2 03/10/2020 0202   LYMPHSABS 1.2 02/26/2020 1317   MONOABS 0.6 02/07/2020 1317   EOSABS 0.3 03/05/2020 1317   BASOSABS 0.1 02/28/2020 1317   CMP    Component Value Date/Time   NA 149 (H) 03/10/2020 0202   K 3.8 03/10/2020 0202   CL 111 03/10/2020 0202   CO2 30 03/10/2020 0202   GLUCOSE 242 (H) 03/10/2020 0202   BUN 49 (H) 03/10/2020 0202   CREATININE 0.95 03/10/2020 0202   CALCIUM 9.3 03/10/2020 0202   PROT 5.1 (L) 03/01/2020 0500   ALBUMIN 3.1 (L) 03/01/2020 0500   AST 17 03/01/2020 0500   ALT 19 03/01/2020 0500   ALKPHOS 67 03/01/2020 0500   BILITOT 1.4 (H) 03/01/2020 0500   GFRNONAA >60 03/10/2020 0202   COAGS Lab Results  Component Value Date   INR 1.5 (H) 03/01/2020   INR 1.4 (H)  02/28/2020   Lipid Panel    Component Value Date/Time   CHOL 96 03/02/2020 0432   TRIG 109 03/06/2020 0704   HDL 28 (L) 03/02/2020 0432   CHOLHDL 3.4 03/02/2020 0432   VLDL 26 03/02/2020 0432   LDLCALC 42 03/02/2020 0432   HgbA1C  Lab Results  Component Value Date   HGBA1C 6.3 (H) 03/01/2020   Urinalysis    Component Value Date/Time   COLORURINE YELLOW 03/03/2020 0613   APPEARANCEUR CLEAR 03/03/2020 0613   LABSPEC 1.009 03/03/2020 0613   PHURINE 7.0 03/03/2020 0613   GLUCOSEU >=500 (A) 03/03/2020 0613   HGBUR NEGATIVE 03/03/2020 0613   BILIRUBINUR NEGATIVE 03/03/2020 0613   KETONESUR 5 (A) 03/03/2020 0613   PROTEINUR NEGATIVE 03/03/2020 0613   NITRITE NEGATIVE 03/03/2020 0613   LEUKOCYTESUR NEGATIVE 03/03/2020 8657    SIGNIFICANT DIAGNOSTIC STUDIES CT HEAD WO CONTRAST  Result Date: 03/04/2020 CLINICAL DATA:  Follow-up examination for acute stroke. EXAM: CT HEAD WITHOUT CONTRAST TECHNIQUE: Contiguous axial images were obtained from the base of the skull through the vertex without intravenous contrast. COMPARISON:  Prior CT from 03/02/2020. FINDINGS: Brain: Continued interval evolution of cytotoxic edema involving a large portion of the right frontal and temporal regions, consistent with evolving subacute right MCA territory infarct. Overall, size  and distribution is not significantly changed from previous. Involvement of the right basal ganglia again noted. Localized edema with partial effacement of the right lateral ventricle has slightly worsened from previous. No more than trace right-to-left midline shift. No hydrocephalus or trapping. Basilar cisterns remain widely patent. No evidence for hemorrhagic transformation. Remainder the brain is otherwise stable in appearance. No other new large vessel territory infarct. Underlying atrophy with chronic microvascular ischemic disease again noted. No extra-axial fluid collection. Vascular: No new hyperdense vessel. Calcified  atherosclerosis present at skull base. Skull: Scalp soft tissues and calvarium are unchanged and remain within normal limits. Sinuses/Orbits: Globes and orbital soft tissues demonstrate no acute finding. Left sphenoid sinus disease noted. Nasogastric tube in place in the left nasal cavity. Mastoid air cells remain clear. Other: None. IMPRESSION: 1. Continued interval evolution of subacute right MCA territory infarct, stable in size and distribution from previous. Slightly worsened localized edema and partial effacement of the right lateral ventricle, but with no more than trace right-to-left midline shift. No hydrocephalus or trapping. No evidence for hemorrhagic transformation. 2. No other new acute intracranial abnormality. Electronically Signed   By: Rise Mu M.D.   On: 03/04/2020 05:31   CT Head Wo Contrast  Result Date: 03/02/2020 CLINICAL DATA:  Follow-up examination for acute stroke. EXAM: CT HEAD WITHOUT CONTRAST TECHNIQUE: Contiguous axial images were obtained from the base of the skull through the vertex without intravenous contrast. COMPARISON:  Prior CT and MRI from 03/01/2020 FINDINGS: Brain: Continued interval evolution of cytotoxic edema involving a large portion of the right frontal and temporal regions, consistent with evolving right MCA territory infarct. Overall, size and distribution is stable from prior MRI. Involvement of the right basal ganglia again noted. Localized edema with partial effacement of the right lateral ventricle without significant midline shift. No hemorrhagic transformation or other complication. No other new acute large vessel territory infarct or hemorrhage. Underlying atrophy with chronic microvascular ischemic disease again noted. No extra-axial fluid collection. Vascular: No hyperdense vessel. Calcified atherosclerosis present at skull base. Skull: Scalp soft tissues and calvarium within normal limits. Sinuses/Orbits: Globes orbital soft tissues  demonstrate no acute finding. Scattered mucosal thickening noted throughout the paranasal sinuses. Endotracheal and enteric tubes partially visualized. No significant mastoid effusion. Other: None. IMPRESSION: 1. 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 No hemorrhagic transformation or other complication. Localized edema without midline shift. 2. No other new acute intracranial abnormality. Electronically Signed   By: Rise Mu M.D.   On: 03/02/2020 04:52   CT HEAD WO CONTRAST  Result Date: 03/01/2020 CLINICAL DATA:  Stroke, follow-up. EXAM: CT HEAD WITHOUT CONTRAST TECHNIQUE: Contiguous axial images were obtained from the base of the skull through the vertex without intravenous contrast. COMPARISON:  2020/03/21 head CT. FINDINGS: Brain: Ill-defined hypodense region and blurring of the gray-white junctions involving the right MCA territory. No intracranial hemorrhage. No mass lesion. No midline shift, ventriculomegaly or extra-axial fluid collection. Mild cerebral atrophy with ex vacuo dilatation. Vascular: No hyperdense vessel or unexpected calcification. Bilateral skull base atherosclerotic calcifications. Skull: Negative for fracture or focal lesion. Sinuses/Orbits: Normal orbits. Minimal pansinus mucosal thickening with trace layering maxillary sinus secretions. Pneumatized mastoid air cells. Other: None. IMPRESSION: 1. Acute right MCA territory infarct. 2. Mild cerebral atrophy. 3. Mild pansinus disease. Trace layering secretions may reflect active inflammation. These results will be called to the ordering clinician or representative by the Radiologist Assistant, and communication documented in the  PACS or [employer]. Electronically Signed   By: Stana Bunting M.D.   On: 03/01/2020 06:05   MR BRAIN WO CONTRAST  Result Date: 03/01/2020 CLINICAL DATA:  Acute right MCA infarction by CT. EXAM: MRI HEAD WITHOUT  CONTRAST TECHNIQUE: Multiplanar, multiecho pulse sequences of the brain and surrounding structures were obtained without intravenous contrast. COMPARISON:  CT same day.  Multiple CT studies done yesterday. FINDINGS: Brain: Diffusion imaging shows acute infarction affecting the right middle cerebral artery territory with sparing of the putamen, globus pallidus and caudate head. Infarction affects the caudate body. Region of cortical and subcortical infarction measures up to 11.5 cm. Mild swelling. Minimal petechial blood products in the region of the infarction. No measurable hematoma. No midline shift. No extra-axial fluid collection. Vascular: Abnormal flow pattern in the right ICA at the base of the brain. There appears to be partial thrombosis or dissection. Skull and upper cervical spine: Negative Sinuses/Orbits: Mucosal inflammatory changes of the paranasal sinuses. Orbits negative. Chronic staphylomas. Other: None IMPRESSION: 1. Acute infarction affecting the right middle cerebral artery territory with sparing of the putamen, globus pallidus and caudate head. Region of cortical and subcortical infarction measures up to 11.5 cm. Minimal petechial blood products in the region of the infarction. No measurable hematoma. No midline shift. 2. Abnormal flow pattern in the right ICA at the base of the brain. There appears to be partial thrombosis or dissection. Electronically Signed   By: Paulina Fusi M.D.   On: 03/01/2020 16:36   IR INTRAVSC STENT CERV CAROTID W/O EMB-PROT MOD SED  Result Date: 03/05/2020 INDICATION: 80 year old male with right ICA occlusion presents for acute revascularization/thrombectomy EXAM: ULTRASOUND-GUIDED ACCESS RIGHT COMMON FEMORAL ARTERY CERVICAL AND CEREBRAL ANGIOGRAM EMERGENT CAROTID STENTING/ANGIOPLASTY THROMBECTOMY RIGHT CEREBRAL HEMISPHERE ANGIO-SEAL FOR HEMOSTASIS COMPARISON:  CT imaging same day No prior CT or ultrasound/duplex imaging MEDICATIONS: None ANESTHESIA/SEDATION: The  anesthesia team was present to provide general endotracheal tube anesthesia and for patient monitoring during the procedure. Intubation was performed in negative pressure [place] in neuro IR holding. Left radial arterial line was performed by the anesthesia team. Interventional neuro radiology nursing staff was also present. CONTRAST:  240 cc FLUOROSCOPY TIME:  Fluoroscopy Time: 131 minutes 46 seconds (5866 mGy). 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 New right M1 occlusion, Embolization to [facility] (ENT) TECHNIQUE: Informed written consent was obtained from the patient's family after a thorough discussion of the procedural risks, benefits and alternatives. Specific risks discussed include: Bleeding, infection, contrast reaction, kidney injury/failure, need for further procedure/surgery, arterial injury or dissection, embolization to new territory, intracranial hemorrhage (10-15% risk), neurologic deterioration, cardiopulmonary collapse, death. All questions were addressed. Maximal Sterile Barrier Technique was utilized including during the procedure including caps, mask, sterile gowns, sterile gloves, sterile drape, hand hygiene and skin antiseptic. A timeout was performed prior to the initiation of the procedure. The anesthesia team was present to provide general endotracheal tube anesthesia and for patient monitoring during the procedure. Interventional neuro radiology nursing staff was also present. FINDINGS: Initial Findings: Type 2 and patulous carotid arch. Innominate artery contributes to origin of the left common carotid artery, in bovine configuration. Right common carotid artery: Normal course caliber and contour. Minimal plaque with no filling defects or irregularity. Right external carotid artery: Patent with antegrade flow. No collateral flow through the middle meningeal pathway, ethmoid pathway, or supraorbital pathway to the right MCA. Right internal carotid artery: The right  cervical ICA is occluded at the origin with dense calcified plaque  with at least 50% circumferential involvement on the angiogram and the prior CT angiogram. There is absence of flow through the entire right cervical ICA to the skull base Right MCA: Initial angiogram was inadequate for evaluating the right MCA, given the ICA occlusion Right ACA: Initial angiogram inadequate for evaluating right ACA, given the ICA occlusion. Intraprocedural findings: The initial passage of microcatheter through the ICA occlusion was without difficulty, without significant stenosis perceived at the proximal cervical ICA. The initial and subsequent passages of the microwire and microcatheter through the cavernous segment, ophthalmic segment/supra ophthalmic segment and terminus was quite difficult given the character of the occlusion in the carotid siphon. The first microcatheter position within the proximal M1, just distal to the bifurcation, demonstrates slip-streaming artifact from the cross-filling ACA territory with patency of the lenticulostriate arteries demonstrated. The first deployment of EMBO trap in this initial catheter position at the right terminus segment. Initial 4 mm balloon angioplasty of the right carotid bulb demonstrated no perceivable waist on the 4 mm balloon, which was inflated to 14 atmospheres without a physiologic response. After the initial EMBO trap deployment, balloon angioplasty, and then retrieval of the EMBO trap through the intermediate catheter and balloon guide, there was persistent occlusion of the ICA through the petrous segment class oral segment to the siphon. The next placement of the microcatheter into the proximal M1 segment demonstrated embolization new territory to the M1. The second deployment stent retriever, the 6 mm solitaire was across the M1 segment and into the terminus segment. After withdrawal of the solitary catheter angiogram confirmed no flow through the cervical segment. After  placement of 9 mm-7 mm ICA carotid stent, there was persistent occlusion of the cervical ICA. After third deployment of stent retriever, 6 mm solitaire across the M1 segment and terminus segment and withdrawal there is no flow through the cervical ICA segment. Therefore assessment of the MCA cannot be performed from the right. Of note, with each further navigation of the micro wire and the microcatheter through the carotid siphon there was increasing difficulty. This may be interpreted as either persisting dense/fibrous embolism and/or dissection and/or chronic stenosis within the carotid siphon secondary to intracranial atherosclerosis. At this point, left cervical/cerebral angiogram was performed, findings below. Final left-sided angiogram demonstrates patency of the anterior communicating artery, with filling of the right ACA territory and leptomeningeal collateral formation towards the MCA watershed, with a persisting right M1 occlusion. Completion Findings: Right MCA: M1 occlusion TICI: TICI 0 Left common carotid artery: Tortuous course of the proximal left common carotid artery, with bovine configuration origin from a type 2 arch. Left external carotid artery: Patent with antegrade flow. Left internal carotid artery: Irregular changes at the left carotid bulb, with a given history of prior carotid endarterectomy. No flow limiting stenosis is present. 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 There is also a focal outpouching on the posterior proximal cervical ICA, without filling defect. Relatively straight course of the left cervical ICA. Vertical and petrous segment patent with normal course caliber contour. Cavernous segment patent. Clinoid segment patent. Antegrade flow of the ophthalmic artery. Ophthalmic segment patent. Terminus patent. Left MCA: M1 segment patent. Insular and opercular  segments patent. Unremarkable caliber and course of the cortical segments. Typical arterial, capillary/ parenchymal, and venous phase. Left ACA: A1 origin is lateral to the carotid siphon with a long A1 segment. The anterior  communicating artery is patent, with cross flow into the right-sided hemisphere. The right A1 is normal caliber, with cross fill into the right ACA territory. 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 PROCEDURE: The anesthesia team was present to provide general endotracheal tube anesthesia and for patient monitoring during the procedure. Intubation was performed in negative pressure [place] in neuro IR holding. Interventional neuro radiology nursing staff was also present. Ultrasound survey of the right inguinal region was performed with images stored and sent to PACs. 11 blade scalpel was used to make a small incision. Blunt dissection was performed with US guidance. A micropuncture needle was used access the right common femoral artery under ultrasound. With excellent arterial blood flow returned, an .018 micro wire was passed through the needle, observed to enter the abdominal aorta under fluoroscopy. The needle was removed, and a micropuncture sheath was placed over the wire. The inner dilator and wire were removed, and an 035 wire was advanced under fluoroscopy into the abdominal aorta. The sheath was removed and a 25cm 9F straight vascular sheath was placed. The dilator was removed and the sheath was flushed. Sheath was attached to pressurized and heparinized saline bag for constant forward flow. A coaxial system was then advanced over the 035 wire. This included a 95cm 087 "Walrus" balloon guide with coaxial 125cm Berenstein diagnostic catheter. This was advanced to the proximal descending thoracic aorta. Wire was then removed. Double flush of the catheter was performed. Catheter was then used to select the innominate artery. Angiogram was performed. Using  roadmap technique, the catheter was advanced over a standard glide wire into right common carotid artery, using a wire position in the external carotid artery, achieving position of the balloon guide catheter in the common carotid artery. The diagnostic catheter and the wire were removed. Formal angiogram was performed. Road map function was used once the occluded vessel was identified. Copious back flush was performed and the balloon catheter was attached to heparinized and pressurized saline bag for forward flow. A second coaxial system was then advanced through the balloon catheter, which included the selected intermediate catheter, microcatheter, and microwire. In this scenario, the set up included a 137cm zoom 71 intermediate catheter, a rapid transit microcatheter, and 014 synchro soft wire. This system was advanced through the balloon guide catheter under the road-map function, with adequate back-flush at the rotating hemostatic valve at that back end of the balloon guide. The microwire was advanced through the occlusion in the proximal cervical ICA, with behavior of the microwire and the medical catheter suggesting a patent ICA with a luminal position. The intermediate catheter tract easily over the microcatheter into the distal cervical ICA. The rapid transit and the microwire were advanced to the cavernous segment of the intracranial ICA. Intermediate catheter remained at the skull base. Microcatheter was then gently advanced with a loop configuration through the carotid sinus, observing the wire to enter the proximal MCA under fluoroscopy. The microcatheter encountered resistance in its course through the carotid siphon and into the proximal right MCA. The wire was removed with saline drip at the hub. Blood was then aspirated through the hub of the microcatheter, and a gentle contrast injection was performed confirming intraluminal position in the proximal M1 segment, just beyond the terminus. Slip  streaming artifact confirmed inflow of the contralateral unopacified blood through the patent anterior communicating artery. A rotating hemostatic valve was then attached to the back end of the microcatheter, and a pressurized and  heparinized saline bag was attached to the catheter. 6.2mm x 45mm Embotrap stent retreiver device was then selected. Back flush was achieved at the rotating hemostatic valve, and then the device was gently advanced through the microcatheter to the distal end. The retriever was then unsheathed by withdrawing the microcatheter under fluoroscopy, targeting the most distal aspect terminating within the ICA terminus segment proximal to the bifurcation. Once the retriever was completely unsheathed, the microcatheter was carefully stripped from the delivery device. At this time, with the knowledge of the inflow from the patent anterior communicating artery and the A1 segment, the EMBO trap device was left in place while we addressed the occluded proximal ICA and the presumable permissive lesion at the ICA origin. A second and parallel synchro soft microwire was advanced through the balloon guide catheter using the rapid transit microcatheter. We were able to cross the occlusion easily, with a distal position achieved at the skull base within the cervical ICA. The microwire was removed with saline drip at the hub. Contrast injection confirmed location within the distal cervical ICA. We then placed a soft tip rapid transit wire into the petrous segment and removed the rapid transit catheter. We then proceeded with balloon angioplasty of the proximal ICA using a 4 mm x 40 mm Viatrac rapid exchange system. Patient remained hemodynamically stable with the balloon angioplasty performed. We then gently advanced the balloon guide catheter over the Viatrac balloon at the carotid bulb upon deflation of the balloon, with a distal cervical ICA position achieved with the balloon guide catheter. Rapid transit  wire was removed. The zoom 71 catheter was then advanced over the EMBO trap retrieval wire, and advanced to the skull base. The catheter was advanced to the site of occlusion at the cervical ICA. The proprietary engine was turned on, with stasis of flow at the cavernous segment location. Catheter would not track further over the stent retriever into the terminal ICA segment meeting resistance at the carotid siphon and ophthalmic segment. The working time between deployment of the first EMBO trap and this point was approximately 20-25 minutes. The balloon at the balloon guide catheter was then inflated under fluoroscopy for proximal flow arrest. Constant aspiration using the proprietary engine was then performed at the zoom 71 intermediate catheter, as the retriever was gently and slowly withdrawn with fluoroscopic observation (representing "pass 1" ). Once the retriever was "corked" within the tip of the intermediate catheter, both were removed from the system. Free aspiration was confirmed at the hub of the balloon guide catheter, with free blood return confirmed. The balloon was then deflated, and a control angiogram was performed. 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 Our choice at this point was to re-attempt a thrombectomy/embolectomy (what would be pass number 2) at the carotid siphon. Combination of the synchro soft catheter, [employer]ker pro 18 microcatheter, and the zoom 71 intermediate catheter were again advanced through the balloon guide. The microcatheter and the microwire were gently advanced through the occlusion within the carotid siphon, again meeting significant resistance through the segment. Once the microwire was within the proximal M1 segment, the microcatheter was advanced to the same position. The intermediate catheter would not advanced beyond the cavernous segment. Microwire was gently withdrawn from the microcatheter, with the saline drip  at the hub. Once the microwire was removed, gentle contrast injection was performed at the microcatheter in the M1 segment position. This gentle injection confirmed that there had been embolization to new  territory with a new embolism in the M1 beyond the microcatheter. Microwire was then readvanced through the microcatheter. Microcatheter and microwire were advanced through this new embolism to the M2 segment and the catheter was gently advanced. Microwire was then withdrawn gently with saline drip at the hub. Once the microwire was removed, gentle contrast injection confirmed a luminal position within the M2 segment. For this second pass, 6 mm solitaire device was selected. Back flush was achieved at the rotating hemostatic valve, and then the device was gently advanced through the microcatheter to the distal end. The retriever was then unsheathed by withdrawing the microcatheter under fluoroscopy. The zoom 71 catheter would not pass beyond the cavernous segment through the ophthalmic segment. Once the retriever was completely unsheathed, the microcatheter was carefully stripped from the delivery device. We then observed a 3 minute interval before withdrawing. The balloon at the balloon guide catheter was then inflated under fluoroscopy for proximal flow arrest. Constant aspiration using the proprietary engine was then performed at the zoom 71 intermediate catheter, positioned in the cavernous segment, as the retriever was gently and slowly withdrawn with fluoroscopic observation. Once the retriever was felt to be "corked" within the tip of the intermediate catheter, both were removed from the system. Free aspiration was confirmed at the hub of the balloon guide catheter, with free blood return confirmed. The balloon was then deflated, and a control angiogram was performed. 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 At this  point we decided to follow through with emergent stenting of the known ICA occlusion at the bifurcation/carotid bulb, in an attempt to improve flow through the distal segment. The withdrawal of the balloon guide catheter to the common carotid artery resulted in some instability of the balloon guide through the innominate artery and the aortic arch. The balloon guide then prolapsed into the aortic arch when we reinserted a wire/catheter combination to address the ICA origin. Balloon guide catheter was then withdrawn into the descending thoracic aorta. 125 cm Berenstein catheter was then advanced through the balloon guide. The catheter was then double flushed in the aorta and then advanced over the aortic arch to engage the innominate artery. Glidewire was then advanced into the common carotid artery and the combination of the coaxial diagnostic catheter and the balloon guide were readvanced into the common carotid artery. The wire was advanced into the external carotid artery and the balloon guide was advanced on the Glidewire. Once the balloon guide was into the proximal external carotid artery, the Glidewire and the diagnostic catheter were withdrawn with constant saline drip at the hub. The balloon guide was then withdrawn below the bifurcation, and angiogram was performed. We then crossed the proximal ICA with a combination of the Stryker pro view microcatheter and a synchro soft catheter. Once we achieved a distal position in the cervical ICA, synchro soft wire was removed and a transcend wire was placed. We then deployed a 9 mm x 7 mm by 40 mm tapered Abbott exact stent. The delivered ice was withdrawn. We then post dilated with a rapid exchange 5 mm x 40 mm Viatrac balloon. The 5 mm balloon dilation did result in transient acute bradycardia into the 30s, which quickly recovered upon deflation of the balloon and withdrawal. No pharmacologic therapy was required. Balloon was withdrawn, and repeat angiogram  confirmed continued occlusion at the stent. Our next strategy was for a third attempt at thrombectomy. We then advanced  a coaxial Stryker microcatheter through the zoom 71 catheter, and advanced this combination over the Transcend wire. The balloon catheter was then gently advanced through the carotid stent with close observation under fluoroscopy. There was no perceptible difficulty in advancing through the fresh stent. For this third pass attempt, combination of the synchro soft wire, Stryker pro 18 microcatheter, and the zoom 71 intermediate catheter were again advanced through the balloon guide. The microcatheter and the microwire were gently advanced through the occlusion within the carotid siphon, again meeting significant resistance through the segment. Once the microwire was within the proximal M1 segment, the microcatheter was advanced to the same position. The intermediate catheter would again not advanced beyond the cavernous segment. Microwire was gently withdrawn from the microcatheter, with the saline drip at the hub. Once the microwire was removed, gentle contrast injection was performed at the microcatheter in the M1 segment position. This gentle injection confirmed persistent occlusion in the M1 beyond the microcatheter. Microwire was then readvanced through the microcatheter. Microcatheter and microwire were advanced through this new embolism to the M2 segment and the catheter was gently advanced. Microwire was then withdrawn gently with saline drip at the hub. Once the microwire was removed, gentle contrast injection confirmed a luminal position within the M2 segment. For this third pass, we elected to use the 6 mm solitaire device. Back flush was achieved at the rotating hemostatic valve, and then the device was gently advanced through the microcatheter to the distal end. The retriever was then unsheathed by withdrawing the microcatheter under fluoroscopy. The zoom 71 catheter would again not  advance beyond the cavernous segment through the ophthalmic segment. Once the retriever was completely unsheathed, the microcatheter was carefully stripped from the delivery device. We observed a 3 minutes time interval. The balloon at the balloon guide catheter was then inflated under fluoroscopy for proximal flow arrest. Constant aspiration using the proprietary engine was then performed at the intermediate catheter, as the retriever was gently and slowly withdrawn with fluoroscopic observation. Once the retriever was "corked" within the tip of the intermediate catheter, both were removed from the system. Free aspiration was confirmed at the hub of the balloon guide catheter, with free blood return confirmed. The balloon was then deflated, and a control angiogram was performed. Persistent occlusion of flow was confirmed at the proximal intracranial ICA. Knowing that we needed to prove patency/occlusion of the proximal M1 ENT, we elected to attempt a fourth pass. For this fourth pass attempt, combination of the synchro soft wire, Stryker pro 18 microcatheter, and the zoom 71 intermediate catheter were again advanced through the balloon guide. The microcatheter and the microwire were gently advanced through the vertical and horizontal petrous segment, through the segment lacerum and into the cavernous segment. On this fourth pass, the microwire and the microcatheter would not advance through the ophthalmic segment, meeting significant resistance. With repositioning of both the intermediate catheter and the microcatheter, there was no advantage gained with pushing the microwire through the occlusion, either with a straight/tip configuration or a looped configuration. With further attempt, there was concern for potentially creating dissection or perforation, and we elected to withdraw at this point an attempt a left-sided angiogram to observe for cross flow. The microcatheter microwire and intermediate catheter were  withdrawn from the balloon guide and the balloon guide was withdrawn from the right ICA and the sheath. We then advanced the JB 1 catheter to the aortic arch on the 035 diagnostic wire. Wire was withdrawn and the  catheter was double flushed. JB 1 catheter was used to engage the innominate artery, though would not engage the bovine origin of the left common carotid artery. The JB 1 catheter was removed and we placed a Davis catheter. The Davis catheter was used to engage the innominate artery, and although and angiogram roadmap was performed, the Glidewire would not advanced beyond the tortuous segment of the proximal left common carotid artery. We then required a Sim 2 catheter. Sim 2 catheter recur was formed within the innominate artery, and used to engage the bovine origin of the common carotid artery. Angiogram was performed for roadmap and a Glidewire was advanced into the common carotid artery. Once the Sim 2 catheter was in the common carotid artery, angiogram was performed. This confirmed that there was cross-flow from the left to the right across patent anterior communicating artery, however, the M1 occlusion persisted. Rosen wire was advanced into the common carotid artery, to the bifurcation. Sim catheter was removed. The coaxial Berenstein catheter and a new wall wrist balloon guide catheter were advanced on the Rose an wire. As these were advancing into the common carotid artery, the system prolapsed into the aortic arch. The balloon guide and the intermediate catheter were withdrawn from the sheath. The Sim 2 catheter was again advanced to the proximal descending thoracic aorta. Catheter was double flushed. The Sim 2 curve was then reformed within the left subclavian artery. Catheter was withdrawn into the origin of the left common carotid artery. Glidewire was then advanced into the cervical ICA. The Sim catheter was then advanced further on this attempt, for a position in the cervical ICA. Once the  Sim catheter was in the mid to distal segment the Glidewire was removed and a rose in wire was placed into the distal cervical ICA. Sim catheter was removed. We then elected to place a more trackable Trak-star access catheter with a coaxial Berenstein catheter. The coaxial system was advanced to the distal cervical segment. Roadmap angiogram was performed. A coaxial Stryker Pro 18 catheter, synchro soft wire, and zoom 55 intermediate catheter were advanced to the cavernous segment. We then made an attempt to place the microcatheter across the patent anterior communicating artery, for final attempt at revascularization of the ENT. The origin of the anterior cerebral artery was at such an angle that catheterization was difficult, and after multiple attempts, in order to avoid any primary injury to the left circulation, we elected to withdraw from the case. All catheters and wires were withdrawn. Distal access catheter was withdrawn from the sheath. Flat panel CT was performed. Patient remained intubated, for medical induced coma and observation, with a target blood pressure of permissive hypertension below 180 systolic Patient tolerated the procedure well. Estimated blood loss 500 cc. 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 Left-sided angiogram demonstrates cross fill via patent anterior communicating artery to the right ACA territory. Angio-Seal for hemostasis Signed, Yvone Neu. Reyne Dumas, RPVI [facility]  [facility] Radiology PLAN: The patient will remain intubated, for medical induced coma purpose. ICU status Target systolic blood pressure of permissive hypertension, less than 180 systolic Right hip straight time 6 hours  Frequent neurovascular checks Repeat neurologic imaging with CT and/MRI at the discretion of neurology team Single antiplatelet agent is reasonable, however, the right ICA stent is occluded, and there is likely no need for dual anti-platelet therapy at this point. Electronically Signed   By: Gilmer Mor D.O.   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 Intubation was performed in negative pressure [place] in neuro IR holding. Left radial arterial line was performed by the anesthesia team. Interventional neuro radiology nursing staff was also present. CONTRAST:  240 cc FLUOROSCOPY TIME:  Fluoroscopy Time: 131 minutes 46 seconds (5866 mGy). 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 New right M1 occlusion, Embolization to [facility] (ENT) TECHNIQUE: Informed written consent was obtained from the patient's family after a thorough discussion of the procedural risks, benefits and alternatives. Specific risks discussed include: Bleeding, infection, contrast reaction, kidney injury/failure, need for further procedure/surgery, arterial injury or dissection, embolization to new territory, intracranial hemorrhage (10-15% risk), neurologic  deterioration, cardiopulmonary collapse, death. All questions were addressed. Maximal Sterile Barrier Technique was utilized including during the procedure including caps, mask, sterile gowns, sterile gloves, sterile drape, hand hygiene and skin antiseptic. A timeout was performed prior to the initiation of the procedure. The anesthesia team was present to provide general endotracheal tube anesthesia and for patient monitoring during the procedure. Interventional neuro radiology nursing staff was also present. FINDINGS: Initial Findings: Type 2 and patulous carotid arch. Innominate artery contributes to origin of the left common carotid artery, in bovine configuration. Right common carotid artery: Normal course caliber and contour. Minimal plaque with no filling defects or irregularity. Right external carotid artery: Patent with antegrade flow. No collateral flow through the middle meningeal pathway, ethmoid pathway, or supraorbital pathway to the right MCA. Right internal carotid artery: The right cervical ICA is occluded at the origin with dense calcified plaque with at least 50% circumferential involvement on the angiogram and the prior CT angiogram. There is absence of flow through the entire right cervical ICA to the skull base Right MCA: Initial angiogram was inadequate for evaluating the right MCA, given the ICA occlusion Right ACA: Initial angiogram inadequate for evaluating right ACA, given the ICA occlusion. Intraprocedural findings: The initial passage of microcatheter through the ICA occlusion was without difficulty, without significant stenosis perceived at the proximal cervical ICA. The initial and subsequent passages of the microwire and microcatheter through the cavernous segment, ophthalmic segment/supra ophthalmic segment and terminus was quite difficult given the character of the occlusion in the carotid siphon. The first microcatheter position within the proximal M1, just distal to the  bifurcation, demonstrates slip-streaming artifact from the cross-filling ACA territory with patency of the lenticulostriate arteries demonstrated. The first deployment of EMBO trap in this initial catheter position at the right terminus segment. Initial 4 mm balloon angioplasty of the right carotid bulb demonstrated no perceivable waist on the 4 mm balloon, which was inflated to 14 atmospheres without a physiologic response. After the initial EMBO trap deployment, balloon angioplasty, and then retrieval of the EMBO trap through the intermediate catheter and balloon guide, there  was persistent occlusion of the ICA through the petrous segment class oral segment to the siphon. The next placement of the microcatheter into the proximal M1 segment demonstrated embolization new territory to the M1. The second deployment stent retriever, the 6 mm solitaire was across the M1 segment and into the terminus segment. After withdrawal of the solitary catheter angiogram confirmed no flow through the cervical segment. After placement of 9 mm-7 mm ICA carotid stent, there was persistent occlusion of the cervical ICA. After third deployment of stent retriever, 6 mm solitaire across the M1 segment and terminus segment and withdrawal there is no flow through the cervical ICA segment. Therefore assessment of the MCA cannot be performed from the right. Of note, with each further navigation of the micro wire and the microcatheter through the carotid siphon there was increasing difficulty. This may be interpreted as either persisting dense/fibrous embolism and/or dissection and/or chronic stenosis within the carotid siphon secondary to intracranial atherosclerosis. At this point, left cervical/cerebral angiogram was performed, findings below. Final left-sided angiogram demonstrates patency of the anterior communicating artery, with filling of the right ACA territory and leptomeningeal collateral formation towards the MCA watershed, with a  persisting right M1 occlusion. Completion Findings: Right MCA: M1 occlusion TICI: TICI 0 Left common carotid artery: Tortuous course of the proximal left common carotid artery, with bovine configuration origin from a type 2 arch. Left external carotid artery: Patent with antegrade flow. Left internal carotid artery: Irregular changes at the left carotid bulb, with a given history of prior carotid endarterectomy. No flow limiting stenosis is present. 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 There is also a focal outpouching on the posterior proximal cervical ICA, without filling defect. Relatively straight course of the left cervical ICA. Vertical and petrous segment patent with normal course caliber contour. Cavernous segment patent. Clinoid segment patent. Antegrade flow of the ophthalmic artery. Ophthalmic segment patent. Terminus patent. Left MCA: M1 segment patent. Insular and opercular segments patent. Unremarkable caliber and course of the cortical segments. Typical arterial, capillary/ parenchymal, and venous phase. Left ACA: A1 origin is lateral to the carotid siphon with a long A1 segment. The anterior communicating artery is patent, with cross flow into the right-sided hemisphere. The right A1 is normal caliber, with cross fill into the right ACA territory. 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 PROCEDURE: The anesthesia team was present to provide general endotracheal tube anesthesia and for patient monitoring during the procedure. Intubation was performed in negative pressure [place] in neuro IR holding. Interventional neuro radiology nursing staff was also present. Ultrasound survey of the right inguinal region was performed with images stored and sent to PACs. 11 blade scalpel was used to make a small incision. Blunt dissection was performed with US  guidance. A micropuncture needle was used access the right common femoral artery under ultrasound. With excellent arterial blood flow returned, an .018 micro wire was passed through the needle, observed to enter the abdominal aorta under fluoroscopy. The needle was removed, and a micropuncture sheath was placed over the wire. The inner dilator and wire were removed, and an 035 wire was advanced under fluoroscopy into the abdominal aorta. The sheath was removed and a 25cm 5F straight vascular sheath was placed. The dilator was removed and the sheath was flushed. Sheath was attached to pressurized and heparinized saline bag for  constant forward flow. A coaxial system was then advanced over the 035 wire. This included a 95cm 087 "Walrus" balloon guide with coaxial 125cm Berenstein diagnostic catheter. This was advanced to the proximal descending thoracic aorta. Wire was then removed. Double flush of the catheter was performed. Catheter was then used to select the innominate artery. Angiogram was performed. Using roadmap technique, the catheter was advanced over a standard glide wire into right common carotid artery, using a wire position in the external carotid artery, achieving position of the balloon guide catheter in the common carotid artery. The diagnostic catheter and the wire were removed. Formal angiogram was performed. Road map function was used once the occluded vessel was identified. Copious back flush was performed and the balloon catheter was attached to heparinized and pressurized saline bag for forward flow. A second coaxial system was then advanced through the balloon catheter, which included the selected intermediate catheter, microcatheter, and microwire. In this scenario, the set up included a 137cm zoom 71 intermediate catheter, a rapid transit microcatheter, and 014 synchro soft wire. This system was advanced through the balloon guide catheter under the road-map function, with adequate back-flush  at the rotating hemostatic valve at that back end of the balloon guide. The microwire was advanced through the occlusion in the proximal cervical ICA, with behavior of the microwire and the medical catheter suggesting a patent ICA with a luminal position. The intermediate catheter tract easily over the microcatheter into the distal cervical ICA. The rapid transit and the microwire were advanced to the cavernous segment of the intracranial ICA. Intermediate catheter remained at the skull base. Microcatheter was then gently advanced with a loop configuration through the carotid sinus, observing the wire to enter the proximal MCA under fluoroscopy. The microcatheter encountered resistance in its course through the carotid siphon and into the proximal right MCA. The wire was removed with saline drip at the hub. Blood was then aspirated through the hub of the microcatheter, and a gentle contrast injection was performed confirming intraluminal position in the proximal M1 segment, just beyond the terminus. Slip streaming artifact confirmed inflow of the contralateral unopacified blood through the patent anterior communicating artery. A rotating hemostatic valve was then attached to the back end of the microcatheter, and a pressurized and heparinized saline bag was attached to the catheter. 6.68mm x 45mm Embotrap stent retreiver device was then selected. Back flush was achieved at the rotating hemostatic valve, and then the device was gently advanced through the microcatheter to the distal end. The retriever was then unsheathed by withdrawing the microcatheter under fluoroscopy, targeting the most distal aspect terminating within the ICA terminus segment proximal to the bifurcation. Once the retriever was completely unsheathed, the microcatheter was carefully stripped from the delivery device. At this time, with the knowledge of the inflow from the patent anterior communicating artery and the A1 segment, the EMBO trap device  was left in place while we addressed the occluded proximal ICA and the presumable permissive lesion at the ICA origin. A second and parallel synchro soft microwire was advanced through the balloon guide catheter using the rapid transit microcatheter. We were able to cross the occlusion easily, with a distal position achieved at the skull base within the cervical ICA. The microwire was removed with saline drip at the hub. Contrast injection confirmed location within the distal cervical ICA. We then placed a soft tip rapid transit wire into the petrous segment and removed the rapid transit catheter. We then proceeded with balloon angioplasty  of the proximal ICA using a 4 mm x 40 mm Viatrac rapid exchange system. Patient remained hemodynamically stable with the balloon angioplasty performed. We then gently advanced the balloon guide catheter over the Viatrac balloon at the carotid bulb upon deflation of the balloon, with a distal cervical ICA position achieved with the balloon guide catheter. Rapid transit wire was removed. The zoom 71 catheter was then advanced over the EMBO trap retrieval wire, and advanced to the skull base. The catheter was advanced to the site of occlusion at the cervical ICA. The proprietary engine was turned on, with stasis of flow at the cavernous segment location. Catheter would not track further over the stent retriever into the terminal ICA segment meeting resistance at the carotid siphon and ophthalmic segment. The working time between deployment of the first EMBO trap and this point was approximately 20-25 minutes. The balloon at the balloon guide catheter was then inflated under fluoroscopy for proximal flow arrest. Constant aspiration using the proprietary engine was then performed at the zoom 71 intermediate catheter, as the retriever was gently and slowly withdrawn with fluoroscopic observation (representing "pass 1" ). Once the retriever was "corked" within the tip of the intermediate  catheter, both were removed from the system. Free aspiration was confirmed at the hub of the balloon guide catheter, with free blood return confirmed. The balloon was then deflated, and a control angiogram was performed. 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 Our choice at this point was to re-attempt a thrombectomy/embolectomy (what would be pass number 2) at the carotid siphon. Combination of the synchro soft catheter, Stryker pro 18 microcatheter, and the zoom 71 intermediate catheter were again advanced through the balloon guide. The microcatheter and the microwire were gently advanced through the occlusion within the carotid siphon, again meeting significant resistance through the segment. Once the microwire was within the proximal M1 segment, the microcatheter was advanced to the same position. The intermediate catheter would not advanced beyond the cavernous segment. Microwire was gently withdrawn from the microcatheter, with the saline drip at the hub. Once the microwire was removed, gentle contrast injection was performed at the microcatheter in the M1 segment position. This gentle injection confirmed that there had been embolization to new territory with a new embolism in the M1 beyond the microcatheter. Microwire was then readvanced through the microcatheter. Microcatheter and microwire were advanced through this new embolism to the M2 segment and the catheter was gently advanced. Microwire was then withdrawn gently with saline drip at the hub. Once the microwire was removed, gentle contrast injection confirmed a luminal position within the M2 segment. For this second pass, 6 mm solitaire device was selected. Back flush was achieved at the rotating hemostatic valve, and then the device was gently advanced through the microcatheter to the distal end. The retriever was then unsheathed by withdrawing the microcatheter under fluoroscopy. The zoom 71  catheter would not pass beyond the cavernous segment through the ophthalmic segment. Once the retriever was completely unsheathed, the microcatheter was carefully stripped from the delivery device. We then observed a 3 minute interval before withdrawing. The balloon at the balloon guide catheter was then inflated under fluoroscopy for proximal flow arrest. Constant aspiration using the proprietary engine was then performed at the zoom 71 intermediate catheter, positioned in the cavernous segment, as the retriever was gently and slowly withdrawn with fluoroscopic observation. Once the retriever was felt to be "corked" within the tip of the intermediate catheter,  both were removed from the system. Free aspiration was confirmed at the hub of the balloon guide catheter, with free blood return confirmed. The balloon was then deflated, and a control angiogram was performed. 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 At this point we decided to follow through with emergent stenting of the known ICA occlusion at the bifurcation/carotid bulb, in an attempt to improve flow through the distal segment. The withdrawal of the balloon guide catheter to the common carotid artery resulted in some instability of the balloon guide through the innominate artery and the aortic arch. The balloon guide then prolapsed into the aortic arch when we reinserted a wire/catheter combination to address the ICA origin. Balloon guide catheter was then withdrawn into the descending thoracic aorta. 125 cm Berenstein catheter was then advanced through the balloon guide. The catheter was then double flushed in the aorta and then advanced over the aortic arch to engage the innominate artery. Glidewire was then advanced into the common carotid artery and the combination of the coaxial diagnostic catheter and the balloon guide were readvanced into the common carotid  artery. The wire was advanced into the external carotid artery and the balloon guide was advanced on the Glidewire. Once the balloon guide was into the proximal external carotid artery, the Glidewire and the diagnostic catheter were withdrawn with constant saline drip at the hub. The balloon guide was then withdrawn below the bifurcation, and angiogram was performed. We then crossed the proximal ICA with a combination of the Stryker pro view microcatheter and a synchro soft catheter. Once we achieved a distal position in the cervical ICA, synchro soft wire was removed and a transcend wire was placed. We then deployed a 9 mm x 7 mm by 40 mm tapered Abbott exact stent. The delivered ice was withdrawn. We then post dilated with a rapid exchange 5 mm x 40 mm Viatrac balloon. The 5 mm balloon dilation did result in transient acute bradycardia into the 30s, which quickly recovered upon deflation of the balloon and withdrawal. No pharmacologic therapy was required. Balloon was withdrawn, and repeat angiogram confirmed continued occlusion at the stent. Our next strategy was for a third attempt at thrombectomy. We then advanced a coaxial Stryker microcatheter through the zoom 71 catheter, and advanced this combination over the Transcend wire. The balloon catheter was then gently advanced through the carotid stent with close observation under fluoroscopy. There was no perceptible difficulty in advancing through the fresh stent. For this third pass attempt, combination of the synchro soft wire, Stryker pro 18 microcatheter, and the zoom 71 intermediate catheter were again advanced through the balloon guide. The microcatheter and the microwire were gently advanced through the occlusion within the carotid siphon, again meeting significant resistance through the segment. Once the microwire was within the proximal M1 segment, the microcatheter was advanced to the same position. The intermediate catheter would again not advanced  beyond the cavernous segment. Microwire was gently withdrawn from the microcatheter, with the saline drip at the hub. Once the microwire was removed, gentle contrast injection was performed at the microcatheter in the M1 segment position. This gentle injection confirmed persistent occlusion in the M1 beyond the microcatheter. Microwire was then readvanced through the microcatheter. Microcatheter and microwire were advanced through this new embolism to the M2 segment and the catheter was gently advanced. Microwire was then withdrawn gently with saline drip at the hub. Once the microwire was removed, gentle  contrast injection confirmed a luminal position within the M2 segment. For this third pass, we elected to use the 6 mm solitaire device. Back flush was achieved at the rotating hemostatic valve, and then the device was gently advanced through the microcatheter to the distal end. The retriever was then unsheathed by withdrawing the microcatheter under fluoroscopy. The zoom 71 catheter would again not advance beyond the cavernous segment through the ophthalmic segment. Once the retriever was completely unsheathed, the microcatheter was carefully stripped from the delivery device. We observed a 3 minutes time interval. The balloon at the balloon guide catheter was then inflated under fluoroscopy for proximal flow arrest. Constant aspiration using the proprietary engine was then performed at the intermediate catheter, as the retriever was gently and slowly withdrawn with fluoroscopic observation. Once the retriever was "corked" within the tip of the intermediate catheter, both were removed from the system. Free aspiration was confirmed at the hub of the balloon guide catheter, with free blood return confirmed. The balloon was then deflated, and a control angiogram was performed. Persistent occlusion of flow was confirmed at the proximal intracranial ICA. Knowing that we needed to prove patency/occlusion of the proximal  M1 ENT, we elected to attempt a fourth pass. For this fourth pass attempt, combination of the synchro soft wire, Stryker pro 18 microcatheter, and the zoom 71 intermediate catheter were again advanced through the balloon guide. The microcatheter and the microwire were gently advanced through the vertical and horizontal petrous segment, through the segment lacerum and into the cavernous segment. On this fourth pass, the microwire and the microcatheter would not advance through the ophthalmic segment, meeting significant resistance. With repositioning of both the intermediate catheter and the microcatheter, there was no advantage gained with pushing the microwire through the occlusion, either with a straight/tip configuration or a looped configuration. With further attempt, there was concern for potentially creating dissection or perforation, and we elected to withdraw at this point an attempt a left-sided angiogram to observe for cross flow. The microcatheter microwire and intermediate catheter were withdrawn from the balloon guide and the balloon guide was withdrawn from the right ICA and the sheath. We then advanced the JB 1 catheter to the aortic arch on the 035 diagnostic wire. Wire was withdrawn and the catheter was double flushed. JB 1 catheter was used to engage the innominate artery, though would not engage the bovine origin of the left common carotid artery. The JB 1 catheter was removed and we placed a Davis catheter. The Davis catheter was used to engage the innominate artery, and although and angiogram roadmap was performed, the Glidewire would not advanced beyond the tortuous segment of the proximal left common carotid artery. We then required a Sim 2 catheter. Sim 2 catheter recur was formed within the innominate artery, and used to engage the bovine origin of the common carotid artery. Angiogram was performed for roadmap and a Glidewire was advanced into the common carotid artery. Once the Sim 2  catheter was in the common carotid artery, angiogram was performed. This confirmed that there was cross-flow from the left to the right across patent anterior communicating artery, however, the M1 occlusion persisted. Rosen wire was advanced into the common carotid artery, to the bifurcation. Sim catheter was removed. The coaxial Berenstein catheter and a new wall wrist balloon guide catheter were advanced on the Rose an wire. As these were advancing into the common carotid artery, the system prolapsed into the aortic arch. The balloon guide and the intermediate  catheter were withdrawn from the sheath. The Sim 2 catheter was again advanced to the proximal descending thoracic aorta. Catheter was double flushed. The Sim 2 curve was then reformed within the left subclavian artery. Catheter was withdrawn into the origin of the left common carotid artery. Glidewire was then advanced into the cervical ICA. The Sim catheter was then advanced further on this attempt, for a position in the cervical ICA. Once the Sim catheter was in the mid to distal segment the Glidewire was removed and a rose in wire was placed into the distal cervical ICA. Sim catheter was removed. We then elected to place a more trackable Trak-star access catheter with a coaxial Berenstein catheter. The coaxial system was advanced to the distal cervical segment. Roadmap angiogram was performed. A coaxial Stryker Pro 18 catheter, synchro soft wire, and zoom 55 intermediate catheter were advanced to the cavernous segment. We then made an attempt to place the microcatheter across the patent anterior communicating artery, for final attempt at revascularization of the ENT. The origin of the anterior cerebral artery was at such an angle that catheterization was difficult, and after multiple attempts, in order to avoid any primary injury to the left circulation, we elected to withdraw from the case. All catheters and wires were withdrawn. Distal access catheter  was withdrawn from the sheath. Flat panel CT was performed. Patient remained intubated, for medical induced coma and observation, with a target blood pressure of permissive hypertension below 180 systolic Patient tolerated the procedure well. Estimated blood loss 500 cc. 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 Left-sided angiogram demonstrates cross fill via patent anterior communicating artery to the right ACA territory. Angio-Seal for hemostasis Signed, Yvone Neu. Reyne Dumas, RPVI [facility] Radiology PLAN: The patient will remain intubated, for medical induced coma purpose. ICU status Target systolic blood pressure of permissive hypertension, less than 180 systolic Right hip straight time 6 hours Frequent neurovascular checks Repeat neurologic imaging with CT and/MRI at the discretion of neurology team Single antiplatelet agent is reasonable, however, the right ICA stent is occluded, and there is likely no need for dual anti-platelet therapy at this point. Electronically Signed   By: Gilmer Mor D.O.   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 Intubation was performed in negative pressure [place] in neuro IR holding. Left radial arterial line was performed by the anesthesia team. Interventional neuro radiology nursing staff was also present. CONTRAST:  240 cc FLUOROSCOPY TIME:  Fluoroscopy Time: 131 minutes 46 seconds (5866 mGy). 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 New right M1 occlusion, Embolization to [facility] (ENT) TECHNIQUE: Informed written consent was obtained from the patient's family after a thorough discussion of the procedural  risks, benefits and alternatives. Specific risks discussed include: Bleeding, infection, contrast reaction, kidney injury/failure, need for further procedure/surgery, arterial injury or dissection, embolization to new territory, intracranial hemorrhage (10-15% risk), neurologic deterioration, cardiopulmonary collapse, death. All questions were addressed. Maximal Sterile Barrier Technique was utilized including during the procedure including caps, mask, sterile gowns, sterile gloves, sterile drape, hand hygiene and skin antiseptic. A timeout was performed prior to the initiation of the procedure. The anesthesia team was present to provide general endotracheal tube anesthesia and for patient monitoring during the procedure. Interventional neuro radiology nursing staff was also present. FINDINGS: Initial Findings: Type 2 and patulous carotid arch. Innominate artery contributes to origin of the left common carotid artery, in bovine configuration. Right common carotid artery: Normal course caliber and contour. Minimal plaque with no filling defects or irregularity. Right external carotid artery: Patent with antegrade flow. No collateral flow through the middle meningeal pathway,  ethmoid pathway, or supraorbital pathway to the right MCA. Right internal carotid artery: The right cervical ICA is occluded at the origin with dense calcified plaque with at least 50% circumferential involvement on the angiogram and the prior CT angiogram. There is absence of flow through the entire right cervical ICA to the skull base Right MCA: Initial angiogram was inadequate for evaluating the right MCA, given the ICA occlusion Right ACA: Initial angiogram inadequate for evaluating right ACA, given the ICA occlusion. Intraprocedural findings: The initial passage of microcatheter through the ICA occlusion was without difficulty, without significant stenosis perceived at the proximal cervical ICA. The initial and subsequent passages of the microwire and microcatheter through the cavernous segment, ophthalmic segment/supra ophthalmic segment and terminus was quite difficult given the character of the occlusion in the carotid siphon. The first microcatheter position within the proximal M1, just distal to the bifurcation, demonstrates slip-streaming artifact from the cross-filling ACA territory with patency of the lenticulostriate arteries demonstrated. The first deployment of EMBO trap in this initial catheter position at the right terminus segment. Initial 4 mm balloon angioplasty of the right carotid bulb demonstrated no perceivable waist on the 4 mm balloon, which was inflated to 14 atmospheres without a physiologic response. After the initial EMBO trap deployment, balloon angioplasty, and then retrieval of the EMBO trap through the intermediate catheter and balloon guide, there was persistent occlusion of the ICA through the petrous segment class oral segment to the siphon. The next placement of the microcatheter into the proximal M1 segment demonstrated embolization new territory to the M1. The second deployment stent retriever, the 6 mm solitaire was across the M1 segment and into the terminus segment. After  withdrawal of the solitary catheter angiogram confirmed no flow through the cervical segment. After placement of 9 mm-7 mm ICA carotid stent, there was persistent occlusion of the cervical ICA. After third deployment of stent retriever, 6 mm solitaire across the M1 segment and terminus segment and withdrawal there is no flow through the cervical ICA segment. Therefore assessment of the MCA cannot be performed from the right. Of note, with each further navigation of the micro wire and the microcatheter through the carotid siphon there was increasing difficulty. This may be interpreted as either persisting dense/fibrous embolism and/or dissection and/or chronic stenosis within the carotid siphon secondary to intracranial atherosclerosis. At this point, left cervical/cerebral angiogram was performed, findings below. Final left-sided angiogram demonstrates patency of the anterior communicating artery, with filling of the right ACA territory and leptomeningeal collateral formation towards the MCA watershed, with a persisting right M1 occlusion. Completion  Findings: Right MCA: M1 occlusion TICI: TICI 0 Left common carotid artery: Tortuous course of the proximal left common carotid artery, with bovine configuration origin from a type 2 arch. Left external carotid artery: Patent with antegrade flow. Left internal carotid artery: Irregular changes at the left carotid bulb, with a given history of prior carotid endarterectomy. No flow limiting stenosis is present. 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 There is also a focal outpouching on the posterior proximal cervical ICA, without filling defect. Relatively straight course of the left cervical ICA. Vertical and petrous segment patent with normal course caliber contour. Cavernous segment patent. Clinoid segment patent. Antegrade flow of the ophthalmic  artery. Ophthalmic segment patent. Terminus patent. Left MCA: M1 segment patent. Insular and opercular segments patent. Unremarkable caliber and course of the cortical segments. Typical arterial, capillary/ parenchymal, and venous phase. Left ACA: A1 origin is lateral to the carotid siphon with a long A1 segment. The anterior communicating artery is patent, with cross flow into the right-sided hemisphere. The right A1 is normal caliber, with cross fill into the right ACA territory. 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 PROCEDURE: The anesthesia team was present to provide general endotracheal tube anesthesia and for patient monitoring during the procedure. Intubation was performed in negative pressure [place] in neuro IR holding. Interventional neuro radiology nursing staff was also present. Ultrasound survey of the right inguinal region was performed with images stored and sent to PACs. 11 blade scalpel was used to make a small incision. Blunt dissection was performed with US guidance. A micropuncture needle was used access the right common femoral artery under ultrasound. With excellent arterial blood flow returned, an .018 micro wire was passed through the needle, observed to enter the abdominal aorta under fluoroscopy. The needle was removed, and a micropuncture sheath was placed over the wire. The inner dilator and wire were removed, and an 035 wire was advanced under fluoroscopy into the abdominal aorta. The sheath was removed and a 25cm 97F straight vascular sheath was placed. The dilator was removed and the sheath was flushed. Sheath was attached to pressurized and heparinized saline bag for constant forward flow. A coaxial system was then advanced over the 035 wire. This included a 95cm 087 "Walrus" balloon guide with coaxial 125cm Berenstein diagnostic catheter. This was advanced to the proximal descending thoracic aorta. Wire was then removed. Double flush of the  catheter was performed. Catheter was then used to select the innominate artery. Angiogram was performed. Using roadmap technique, the catheter was advanced over a standard glide wire into right common carotid artery, using a wire position in the external carotid artery, achieving position of the balloon guide catheter in the common carotid artery. The diagnostic catheter and the wire were removed. Formal angiogram was performed. Road map function was used once the occluded vessel was identified. Copious back flush was performed and the balloon catheter was attached to heparinized and pressurized saline bag for forward flow. A second coaxial system was then advanced through the balloon catheter, which included the selected intermediate catheter, microcatheter, and microwire. In this scenario, the set up included a 137cm zoom 71 intermediate catheter, a rapid transit microcatheter, and 014 synchro soft wire. This system was advanced through the balloon guide catheter under the road-map function, with adequate back-flush at the rotating hemostatic valve at that back end  of the balloon guide. The microwire was advanced through the occlusion in the proximal cervical ICA, with behavior of the microwire and the medical catheter suggesting a patent ICA with a luminal position. The intermediate catheter tract easily over the microcatheter into the distal cervical ICA. The rapid transit and the microwire were advanced to the cavernous segment of the intracranial ICA. Intermediate catheter remained at the skull base. Microcatheter was then gently advanced with a loop configuration through the carotid sinus, observing the wire to enter the proximal MCA under fluoroscopy. The microcatheter encountered resistance in its course through the carotid siphon and into the proximal right MCA. The wire was removed with saline drip at the hub. Blood was then aspirated through the hub of the microcatheter, and a gentle contrast injection  was performed confirming intraluminal position in the proximal M1 segment, just beyond the terminus. Slip streaming artifact confirmed inflow of the contralateral unopacified blood through the patent anterior communicating artery. A rotating hemostatic valve was then attached to the back end of the microcatheter, and a pressurized and heparinized saline bag was attached to the catheter. 6.35mm x 45mm Embotrap stent retreiver device was then selected. Back flush was achieved at the rotating hemostatic valve, and then the device was gently advanced through the microcatheter to the distal end. The retriever was then unsheathed by withdrawing the microcatheter under fluoroscopy, targeting the most distal aspect terminating within the ICA terminus segment proximal to the bifurcation. Once the retriever was completely unsheathed, the microcatheter was carefully stripped from the delivery device. At this time, with the knowledge of the inflow from the patent anterior communicating artery and the A1 segment, the EMBO trap device was left in place while we addressed the occluded proximal ICA and the presumable permissive lesion at the ICA origin. A second and parallel synchro soft microwire was advanced through the balloon guide catheter using the rapid transit microcatheter. We were able to cross the occlusion easily, with a distal position achieved at the skull base within the cervical ICA. The microwire was removed with saline drip at the hub. Contrast injection confirmed location within the distal cervical ICA. We then placed a soft tip rapid transit wire into the petrous segment and removed the rapid transit catheter. We then proceeded with balloon angioplasty of the proximal ICA using a 4 mm x 40 mm Viatrac rapid exchange system. Patient remained hemodynamically stable with the balloon angioplasty performed. We then gently advanced the balloon guide catheter over the Viatrac balloon at the carotid bulb upon deflation of  the balloon, with a distal cervical ICA position achieved with the balloon guide catheter. Rapid transit wire was removed. The zoom 71 catheter was then advanced over the EMBO trap retrieval wire, and advanced to the skull base. The catheter was advanced to the site of occlusion at the cervical ICA. The proprietary engine was turned on, with stasis of flow at the cavernous segment location. Catheter would not track further over the stent retriever into the terminal ICA segment meeting resistance at the carotid siphon and ophthalmic segment. The working time between deployment of the first EMBO trap and this point was approximately 20-25 minutes. The balloon at the balloon guide catheter was then inflated under fluoroscopy for proximal flow arrest. Constant aspiration using the proprietary engine was then performed at the zoom 71 intermediate catheter, as the retriever was gently and slowly withdrawn with fluoroscopic observation (representing "pass 1" ). Once the retriever was "corked" within the tip of the intermediate catheter,  both were removed from the system. Free aspiration was confirmed at the hub of the balloon guide catheter, with free blood return confirmed. The balloon was then deflated, and a control angiogram was performed. 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 Our choice at this point was to re-attempt a thrombectomy/embolectomy (what would be pass number 2) at the carotid siphon. Combination of the synchro soft catheter, Stryker pro 18 microcatheter, and the zoom 71 intermediate catheter were again advanced through the balloon guide. The microcatheter and the microwire were gently advanced through the occlusion within the carotid siphon, again meeting significant resistance through the segment. Once the microwire was within the proximal M1 segment, the microcatheter was advanced to the same position. The intermediate catheter would not advanced  beyond the cavernous segment. Microwire was gently withdrawn from the microcatheter, with the saline drip at the hub. Once the microwire was removed, gentle contrast injection was performed at the microcatheter in the M1 segment position. This gentle injection confirmed that there had been embolization to new territory with a new embolism in the M1 beyond the microcatheter. Microwire was then readvanced through the microcatheter. Microcatheter and microwire were advanced through this new embolism to the M2 segment and the catheter was gently advanced. Microwire was then withdrawn gently with saline drip at the hub. Once the microwire was removed, gentle contrast injection confirmed a luminal position within the M2 segment. For this second pass, 6 mm solitaire device was selected. Back flush was achieved at the rotating hemostatic valve, and then the device was gently advanced through the microcatheter to the distal end. The retriever was then unsheathed by withdrawing the microcatheter under fluoroscopy. The zoom 71 catheter would not pass beyond the cavernous segment through the ophthalmic segment. Once the retriever was completely unsheathed, the microcatheter was carefully stripped from the delivery device. We then observed a 3 minute interval before withdrawing. The balloon at the balloon guide catheter was then inflated under fluoroscopy for proximal flow arrest. Constant aspiration using the proprietary engine was then performed at the zoom 71 intermediate catheter, positioned in the cavernous segment, as the retriever was gently and slowly withdrawn with fluoroscopic observation. Once the retriever was felt to be "corked" within the tip of the intermediate catheter, both were removed from the system. Free aspiration was confirmed at the hub of the balloon guide catheter, with free blood return confirmed. The balloon was then deflated, and a control angiogram was performed. 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 At this point we decided to follow through with emergent stenting of the known ICA occlusion at the bifurcation/carotid bulb, in an attempt to improve flow through the distal segment. The withdrawal of the balloon guide catheter to the common carotid artery resulted in some instability of the balloon guide through the innominate artery and the aortic arch. The balloon guide then prolapsed into the aortic arch when we reinserted a wire/catheter combination to address the ICA origin. Balloon guide catheter was then withdrawn into the descending thoracic aorta. 125 cm Berenstein catheter was then advanced through the balloon guide. The catheter was then double flushed in the aorta and then advanced over the aortic arch to engage the innominate artery. Glidewire was then advanced into the common carotid artery and the combination of the coaxial diagnostic catheter and the balloon guide were readvanced into the common carotid artery. The wire was advanced into the  external carotid artery and the balloon guide was advanced on the Glidewire. Once the balloon guide was into the proximal external carotid artery, the Glidewire and the diagnostic catheter were withdrawn with constant saline drip at the hub. The balloon guide was then withdrawn below the bifurcation, and angiogram was performed. We then crossed the proximal ICA with a combination of the Stryker pro view microcatheter and a synchro soft catheter. Once we achieved a distal position in the cervical ICA, synchro soft wire was removed and a transcend wire was placed. We then deployed a 9 mm x 7 mm by 40 mm tapered Abbott exact stent. The delivered ice was withdrawn. We then post dilated with a rapid exchange 5 mm x 40 mm Viatrac balloon. The 5 mm balloon dilation did result in transient acute bradycardia into the 30s, which quickly recovered upon deflation of the balloon and  withdrawal. No pharmacologic therapy was required. Balloon was withdrawn, and repeat angiogram confirmed continued occlusion at the stent. Our next strategy was for a third attempt at thrombectomy. We then advanced a coaxial Stryker microcatheter through the zoom 71 catheter, and advanced this combination over the Transcend wire. The balloon catheter was then gently advanced through the carotid stent with close observation under fluoroscopy. There was no perceptible difficulty in advancing through the fresh stent. For this third pass attempt, combination of the synchro soft wire, Stryker pro 18 microcatheter, and the zoom 71 intermediate catheter were again advanced through the balloon guide. The microcatheter and the microwire were gently advanced through the occlusion within the carotid siphon, again meeting significant resistance through the segment. Once the microwire was within the proximal M1 segment, the microcatheter was advanced to the same position. The intermediate catheter would again not advanced beyond the cavernous segment. Microwire was gently withdrawn from the microcatheter, with the saline drip at the hub. Once the microwire was removed, gentle contrast injection was performed at the microcatheter in the M1 segment position. This gentle injection confirmed persistent occlusion in the M1 beyond the microcatheter. Microwire was then readvanced through the microcatheter. Microcatheter and microwire were advanced through this new embolism to the M2 segment and the catheter was gently advanced. Microwire was then withdrawn gently with saline drip at the hub. Once the microwire was removed, gentle contrast injection confirmed a luminal position within the M2 segment. For this third pass, we elected to use the 6 mm solitaire device. Back flush was achieved at the rotating hemostatic valve, and then the device was gently advanced through the microcatheter to the distal end. The retriever was then  unsheathed by withdrawing the microcatheter under fluoroscopy. The zoom 71 catheter would again not advance beyond the cavernous segment through the ophthalmic segment. Once the retriever was completely unsheathed, the microcatheter was carefully stripped from the delivery device. We observed a 3 minutes time interval. The balloon at the balloon guide catheter was then inflated under fluoroscopy for proximal flow arrest. Constant aspiration using the proprietary engine was then performed at the intermediate catheter, as the retriever was gently and slowly withdrawn with fluoroscopic observation. Once the retriever was "corked" within the tip of the intermediate catheter, both were removed from the system. Free aspiration was confirmed at the hub of the balloon guide catheter, with free blood return confirmed. The balloon was then deflated, and a control angiogram was performed. Persistent occlusion of flow was confirmed at the proximal intracranial ICA. Knowing that we needed to prove patency/occlusion of the proximal M1 ENT, we elected to  attempt a fourth pass. For this fourth pass attempt, combination of the synchro soft wire, Stryker pro 18 microcatheter, and the zoom 71 intermediate catheter were again advanced through the balloon guide. The microcatheter and the microwire were gently advanced through the vertical and horizontal petrous segment, through the segment lacerum and into the cavernous segment. On this fourth pass, the microwire and the microcatheter would not advance through the ophthalmic segment, meeting significant resistance. With repositioning of both the intermediate catheter and the microcatheter, there was no advantage gained with pushing the microwire through the occlusion, either with a straight/tip configuration or a looped configuration. With further attempt, there was concern for potentially creating dissection or perforation, and we elected to withdraw at this point an attempt a  left-sided angiogram to observe for cross flow. The microcatheter microwire and intermediate catheter were withdrawn from the balloon guide and the balloon guide was withdrawn from the right ICA and the sheath. We then advanced the JB 1 catheter to the aortic arch on the 035 diagnostic wire. Wire was withdrawn and the catheter was double flushed. JB 1 catheter was used to engage the innominate artery, though would not engage the bovine origin of the left common carotid artery. The JB 1 catheter was removed and we placed a Davis catheter. The Davis catheter was used to engage the innominate artery, and although and angiogram roadmap was performed, the Glidewire would not advanced beyond the tortuous segment of the proximal left common carotid artery. We then required a Sim 2 catheter. Sim 2 catheter recur was formed within the innominate artery, and used to engage the bovine origin of the common carotid artery. Angiogram was performed for roadmap and a Glidewire was advanced into the common carotid artery. Once the Sim 2 catheter was in the common carotid artery, angiogram was performed. This confirmed that there was cross-flow from the left to the right across patent anterior communicating artery, however, the M1 occlusion persisted. Rosen wire was advanced into the common carotid artery, to the bifurcation. Sim catheter was removed. The coaxial Berenstein catheter and a new wall wrist balloon guide catheter were advanced on the Rose an wire. As these were advancing into the common carotid artery, the system prolapsed into the aortic arch. The balloon guide and the intermediate catheter were withdrawn from the sheath. The Sim 2 catheter was again advanced to the proximal descending thoracic aorta. Catheter was double flushed. The Sim 2 curve was then reformed within the left subclavian artery. Catheter was withdrawn into the origin of the left common carotid artery. Glidewire was then advanced into the cervical ICA.  The Sim catheter was then advanced further on this attempt, for a position in the cervical ICA. Once the Sim catheter was in the mid to distal segment the Glidewire was removed and a rose in wire was placed into the distal cervical ICA. Sim catheter was removed. We then elected to place a more trackable Trak-star access catheter with a coaxial Berenstein catheter. The coaxial system was advanced to the distal cervical segment. Roadmap angiogram was performed. A coaxial Stryker Pro 18 catheter, synchro soft wire, and zoom 55 intermediate catheter were advanced to the cavernous segment. We then made an attempt to place the microcatheter across the patent anterior communicating artery, for final attempt at revascularization of the ENT. The origin of the anterior cerebral artery was at such an angle that catheterization was difficult, and after multiple attempts, in order to avoid any primary injury to the left circulation,  we elected to withdraw from the case. All catheters and wires were withdrawn. Distal access catheter was withdrawn from the sheath. Flat panel CT was performed. Patient remained intubated, for medical induced coma and observation, with a target blood pressure of permissive hypertension below 180 systolic Patient tolerated the procedure well. Estimated blood loss 500 cc. 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 Left-sided angiogram demonstrates cross fill via patent anterior communicating artery to the right ACA territory.  Angio-Seal for hemostasis Signed, Yvone Neu. Reyne Dumas, RPVI [facility] PLAN: The patient will remain intubated, for medical induced coma purpose. ICU status Target systolic blood pressure of permissive hypertension, less than 180 systolic Right hip straight time 6 hours Frequent neurovascular checks Repeat neurologic imaging with CT and/MRI at the discretion of neurology team Single antiplatelet agent is reasonable, however, the right ICA stent is occluded, and there is likely no need for dual anti-platelet therapy at this point. Electronically Signed   By: Gilmer Mor D.O.   On: 03/02/2020 13:44   CT CEREBRAL PERFUSION W CONTRAST  Result Date: 02/17/2020 CLINICAL DATA:  Left-sided weakness EXAM: CT PERFUSION BRAIN TECHNIQUE: Multiphase CT imaging of the brain was performed following IV bolus contrast injection. Subsequent parametric perfusion maps were calculated using RAPID software. CONTRAST:  40mL OMNIPAQUE IOHEXOL 350 MG/ML SOLN COMPARISON:  None. FINDINGS: CT Brain Perfusion Findings: CBF (<30%) Volume: 0mL Perfusion (Tmax>6.0s) volume: Mismatch Volume: 06/01/2046mL ASPECTS on noncontrast CT Head: 10 at 1:19 p.m. today. Infarct Core: 0 mL Infarction Location: Right MCA territory and watershed territories. IMPRESSION: No core infarction. Large area of territory at risk throughout the right MCA territory as well as MCA/ACA and MCA/PCA watershed territories. Electronically Signed   By: Guadlupe Spanish M.D.   On: 02/11/2020 13:55   DG CHEST PORT 1 VIEW  Result Date: 03/03/2020 CLINICAL DATA:  Fever EXAM: PORTABLE CHEST 1 VIEW COMPARISON:  02/23/2020 FINDINGS: Endotracheal tube is been removed. Enteric tube is in place with tip below the left hemidiaphragm but off the field of view. Cardiac enlargement. Probable small left pleural effusion. Infiltration or atelectasis in the left base. Hazy interstitial changes in the lungs may represent  edema. Calcification of the aorta. IMPRESSION: Cardiac enlargement with probable small left pleural effusion and infiltration or atelectasis in the left base. Hazy interstitial changes in the lungs may represent edema. Electronically Signed   By: Burman Nieves M.D.   On: 03/03/2020 07:10   DG Chest Port 1 View  Result Date: 02/14/2020 CLINICAL DATA:  Stroke-like symptoms. EXAM: PORTABLE CHEST 1 VIEW COMPARISON:  May 23, 2019 FINDINGS: An endotracheal tube is seen with its distal tip approximately 5.7 cm from the carina. A nasogastric tube is present. Its distal end extends below the level of the diaphragm. The cardiac silhouette is mildly enlarged. There is moderate severity calcification of the aortic arch. Degenerative changes seen throughout the thoracic spine. IMPRESSION: 1. Endotracheal tube and nasogastric tube in good position. 2. No acute infiltrate. Electronically Signed   By: Aram Candela M.D.   On: 02/13/2020 20:26   DG Abd Portable 1V  Result Date: 02/15/2020 CLINICAL DATA:  Gastric tube placement EXAM: PORTABLE ABDOMEN - 1 VIEW COMPARISON:  None FINDINGS: Gastric tube in the proximal stomach. Side port likely just above the GE junction. Basilar atelectasis. No effusion. Paucity of gas in the abdomen. Excreted contrast in the collecting systems of bilateral kidneys. No acute skeletal process to the extent evaluated. IMPRESSION: Gastric tube in the proximal stomach with side port likely just above the GE junction. Consider advancing a few more centimeters to ensure intragastric positioning. These results will be called to the ordering clinician or representative by the Radiologist Assistant, and communication documented in the PACS or [employer]. Electronically Signed   By: Donzetta Kohut M.D.   On: 02/13/2020 21:23   ECHOCARDIOGRAM COMPLETE  Result Date: 03/01/2020    ECHOCARDIOGRAM REPORT   Patient Name:   DJON TITH Date of Exam: 03/01/2020 Medical Rec #:  [id number]    Height:       66.0 in Accession #:    [id number]  Weight:       191.8 lb Date of Birth:  11-14-1940   BSA:          1.965 m Patient Age:    79 years    BP:           138/67 mmHg Patient Gender: M           HR:           91 bpm. Exam Location:  Inpatient Procedure: 2D Echo, Color Doppler and Cardiac Doppler Indications:    Stroke i163.9  History:        Patient has no prior history of Echocardiogram examinations.                 CAD, Arrythmias:Atrial Fibrillation; Risk Factors:Hypertension,                 Diabetes and Dyslipidemia.  Sonographer:    Irving Burton Senior RDCS Referring Phys: [id number] [facility]  Sonographer Comments: Scanned supine on artificial respirator. IMPRESSIONS  1. Left ventricular ejection fraction, by estimation, is 60 to 65%. The left ventricle has normal function. The left ventricle has no regional wall motion abnormalities. There is mild left ventricular hypertrophy. Left ventricular diastolic parameters are indeterminate.  2. Right ventricular systolic function is normal. The right ventricular size is mildly enlarged. There is mildly elevated pulmonary artery systolic pressure. The estimated right ventricular systolic pressure is 37.4 mmHg.  3. Left atrial size was moderately dilated.  4. Right atrial size was moderately dilated.  5. The mitral valve is normal in structure. Trivial mitral valve regurgitation. No evidence of mitral stenosis.  6. Tricuspid valve regurgitation is moderate.  7. The aortic valve is calcified. There is moderate calcification of the aortic valve. There is moderate thickening of the aortic valve. Aortic valve regurgitation is not visualized. Mild aortic valve stenosis. Aortic valve mean gradient measures 15.6 mmHg. Aortic valve Vmax measures 2.56 m/s.  8. The inferior vena cava is dilated in size with >50% respiratory variability, suggesting right atrial pressure of 8 mmHg. FINDINGS  Left Ventricle: Left ventricular ejection fraction, by estimation, is 60 to 65%.  The left ventricle has normal function. The left ventricle has no regional wall motion abnormalities. The left ventricular internal cavity size was normal in size. There is  mild left ventricular hypertrophy. Left ventricular diastolic parameters are indeterminate. Right Ventricle: The right ventricular size is mildly enlarged. No increase in right ventricular wall thickness. Right ventricular systolic function is normal. There is mildly elevated pulmonary  artery systolic pressure. The tricuspid regurgitant velocity is 2.71 m/s, and with an assumed right atrial pressure of 8 mmHg, the estimated right ventricular systolic pressure is 37.4 mmHg. Left Atrium: Left atrial size was moderately dilated. Right Atrium: Right atrial size was moderately dilated. Pericardium: There is no evidence of pericardial effusion. Mitral Valve: The mitral valve is normal in structure. Trivial mitral valve regurgitation. No evidence of mitral valve stenosis. Tricuspid Valve: The tricuspid valve is normal in structure. Tricuspid valve regurgitation is moderate . No evidence of tricuspid stenosis. Aortic Valve: The aortic valve is calcified. There is moderate calcification of the aortic valve. There is moderate thickening of the aortic valve. Aortic valve regurgitation is not visualized. Mild aortic stenosis is present. Aortic valve mean gradient measures 15.6 mmHg. Aortic valve peak gradient measures 26.3 mmHg. Aortic valve area, by VTI measures 0.91 cm. Pulmonic Valve: The pulmonic valve was normal in structure. Pulmonic valve regurgitation is not visualized. No evidence of pulmonic stenosis. Aorta: The aortic root is normal in size and structure. Venous: The inferior vena cava is dilated in size with greater than 50% respiratory variability, suggesting right atrial pressure of 8 mmHg. IAS/Shunts: No atrial level shunt detected by color flow Doppler.  LEFT VENTRICLE PLAX 2D LVIDd:         3.70 cm LVIDs:         1.80 cm LV PW:          1.20 cm LV IVS:        1.30 cm LVOT diam:     2.00 cm LV SV:         43 LV SV Index:   22 LVOT Area:     3.14 cm  RIGHT VENTRICLE RV S prime:     8.16 cm/s TAPSE (M-mode): 1.3 cm LEFT ATRIUM             Index       RIGHT ATRIUM           Index LA diam:        5.10 cm 2.60 cm/m  RA Area:     21.30 cm LA Vol (A2C):   83.2 ml 42.35 ml/m RA Volume:   63.50 ml  32.32 ml/m LA Vol (A4C):   90.4 ml 46.02 ml/m LA Biplane Vol: 88.4 ml 45.00 ml/m  AORTIC VALVE AV Area (Vmax):    0.85 cm AV Area (Vmean):   0.89 cm AV Area (VTI):     0.91 cm AV Vmax:           256.20 cm/s AV Vmean:          185.400 cm/s AV VTI:            0.476 m AV Peak Grad:      26.3 mmHg AV Mean Grad:      15.6 mmHg LVOT Vmax:         69.13 cm/s LVOT Vmean:        52.700 cm/s LVOT VTI:          0.137 m LVOT/AV VTI ratio: 0.29  AORTA Ao Root diam: 3.70 cm Ao Asc diam:  3.70 cm TRICUSPID VALVE TR Peak grad:   29.4 mmHg TR Vmax:        271.00 cm/s  SHUNTS Systemic VTI:  0.14 m Systemic Diam: 2.00 cm Donato Schultz MD Electronically signed by Donato Schultz MD Signature Date/Time: 03/01/2020/2:44:19 PM    Final    IR PERCUTANEOUS ART THROMBECTOMY/INFUSION INTRACRANIAL INC DIAG  ANGIO  Result Date: 03/02/2020 INDICATION: 80 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 Intubation was performed in negative pressure [place] in neuro IR holding. Left radial arterial line was performed by the anesthesia team. Interventional neuro radiology nursing staff was also present. CONTRAST:  240 cc  FLUOROSCOPY TIME:  Fluoroscopy Time: 131 minutes 46 seconds (5866 mGy). 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 Specific risks discussed include: Bleeding, infection, contrast reaction, kidney injury/failure, need for further procedure/surgery, arterial injury or dissection, embolization to new territory, intracranial hemorrhage (10-15% risk), neurologic deterioration, cardiopulmonary collapse, death. All questions were addressed. Maximal Sterile Barrier Technique was utilized including during the procedure including caps, mask, sterile gowns, sterile gloves, sterile drape, hand hygiene and skin antiseptic. A timeout was performed prior to the initiation of the procedure. The anesthesia team was present to provide general endotracheal tube anesthesia and for patient monitoring during the procedure. Interventional neuro radiology nursing staff was also present. FINDINGS: Initial Findings: Type 2 and patulous carotid arch. Innominate artery contributes to origin of the left common carotid artery, in bovine configuration. Right common carotid artery: Normal course caliber and contour. Minimal plaque with no filling defects or irregularity. Right external carotid artery: Patent with antegrade flow. No collateral flow through the middle meningeal pathway, ethmoid pathway, or supraorbital pathway to the right MCA. Right internal carotid artery: The right cervical ICA is occluded at the origin with dense calcified plaque with at least 50% circumferential involvement on the angiogram and the prior CT angiogram. There is absence of flow through the entire right cervical ICA to the skull base Right MCA: Initial angiogram was inadequate for evaluating the right MCA, given the ICA occlusion Right  ACA: Initial angiogram inadequate for evaluating right ACA, given the ICA occlusion. Intraprocedural findings: The initial passage of microcatheter through the ICA occlusion was without difficulty, without significant stenosis perceived at the proximal cervical ICA. The initial and subsequent passages of the microwire and microcatheter through the cavernous segment, ophthalmic segment/supra ophthalmic segment and terminus was quite difficult given the character of the occlusion in the carotid siphon. The first microcatheter position within the proximal M1, just distal to the bifurcation, demonstrates slip-streaming artifact from the cross-filling ACA territory with patency of the lenticulostriate arteries demonstrated. The first deployment of EMBO trap in this initial catheter position at the right terminus segment. Initial 4 mm balloon angioplasty of the right carotid bulb demonstrated no perceivable waist on the 4 mm balloon, which was inflated to 14 atmospheres without a physiologic response. After the initial EMBO trap deployment, balloon angioplasty, and then retrieval of the EMBO trap through the intermediate catheter and balloon guide, there was persistent occlusion of the ICA through the petrous segment class oral segment to the siphon. The next placement of the microcatheter into the proximal M1 segment demonstrated embolization new territory to the M1. The second deployment stent retriever, the 6 mm solitaire was across the M1 segment and into the terminus segment. After withdrawal of the solitary catheter  angiogram confirmed no flow through the cervical segment. After placement of 9 mm-7 mm ICA carotid stent, there was persistent occlusion of the cervical ICA. After third deployment of stent retriever, 6 mm solitaire across the M1 segment and terminus segment and withdrawal there is no flow through the cervical ICA segment. Therefore assessment of the MCA cannot be performed from the right. Of note, with  each further navigation of the micro wire and the microcatheter through the carotid siphon there was increasing difficulty. This may be interpreted as either persisting dense/fibrous embolism and/or dissection and/or chronic stenosis within the carotid siphon secondary to intracranial atherosclerosis. At this point, left cervical/cerebral angiogram was performed, findings below. Final left-sided angiogram demonstrates patency of the anterior communicating artery, with filling of the right ACA territory and leptomeningeal collateral formation towards the MCA watershed, with a persisting right M1 occlusion. Completion Findings: Right MCA: M1 occlusion TICI: TICI 0 Left common carotid artery: Tortuous course of the proximal left common carotid artery, with bovine configuration origin from a type 2 arch. Left external carotid artery: Patent with antegrade flow. Left internal carotid artery: Irregular changes at the left carotid bulb, with a given history of prior carotid endarterectomy. No flow limiting stenosis is present. 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 There is also a focal outpouching on the posterior proximal cervical ICA, without filling defect. Relatively straight course of the left cervical ICA. Vertical and petrous segment patent with normal course caliber contour. Cavernous segment patent. Clinoid segment patent. Antegrade flow of the ophthalmic artery. Ophthalmic segment patent. Terminus patent. Left MCA: M1 segment patent. Insular and opercular segments patent. Unremarkable caliber and course of the cortical segments. Typical arterial, capillary/ parenchymal, and venous phase. Left ACA: A1 origin is lateral to the carotid siphon with a long A1 segment. The anterior communicating artery is patent, with cross flow into the right-sided hemisphere. The right A1 is normal caliber, with  cross fill into the right ACA territory. 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 PROCEDURE: The anesthesia team was present to provide general endotracheal tube anesthesia and for patient monitoring during the procedure. Intubation was performed in negative pressure [place] in neuro IR holding. Interventional neuro radiology nursing staff was also present. Ultrasound survey of the right inguinal region was performed with images stored and sent to PACs. 11 blade scalpel was used to make a small incision. Blunt dissection was performed with US guidance. A micropuncture needle was used access the right common femoral artery under ultrasound. With excellent arterial blood flow returned, an .018 micro wire was passed through the needle, observed to enter the abdominal aorta under fluoroscopy. The needle was removed, and a micropuncture sheath was placed over the wire. The inner dilator and wire were removed, and an 035 wire was advanced under fluoroscopy into the abdominal aorta. The sheath was removed and a 25cm 64F straight vascular sheath was placed. The dilator was removed and the sheath was flushed. Sheath was attached to pressurized and heparinized saline bag for constant forward flow. A coaxial system was then advanced over the 035 wire. This included a 95cm 087 "Walrus" balloon guide with coaxial 125cm Berenstein diagnostic catheter. This was advanced to the proximal descending thoracic aorta. Wire was then removed. Double flush of the catheter was performed. Catheter was then used to select the innominate artery. Angiogram was performed.  Using roadmap technique, the catheter was advanced over a standard glide wire into right common carotid artery, using a wire position in the external carotid artery, achieving position of the balloon guide catheter in the common carotid artery. The diagnostic catheter and the wire were removed. Formal angiogram was performed. Road map function  was used once the occluded vessel was identified. Copious back flush was performed and the balloon catheter was attached to heparinized and pressurized saline bag for forward flow. A second coaxial system was then advanced through the balloon catheter, which included the selected intermediate catheter, microcatheter, and microwire. In this scenario, the set up included a 137cm zoom 71 intermediate catheter, a rapid transit microcatheter, and 014 synchro soft wire. This system was advanced through the balloon guide catheter under the road-map function, with adequate back-flush at the rotating hemostatic valve at that back end of the balloon guide. The microwire was advanced through the occlusion in the proximal cervical ICA, with behavior of the microwire and the medical catheter suggesting a patent ICA with a luminal position. The intermediate catheter tract easily over the microcatheter into the distal cervical ICA. The rapid transit and the microwire were advanced to the cavernous segment of the intracranial ICA. Intermediate catheter remained at the skull base. Microcatheter was then gently advanced with a loop configuration through the carotid sinus, observing the wire to enter the proximal MCA under fluoroscopy. The microcatheter encountered resistance in its course through the carotid siphon and into the proximal right MCA. The wire was removed with saline drip at the hub. Blood was then aspirated through the hub of the microcatheter, and a gentle contrast injection was performed confirming intraluminal position in the proximal M1 segment, just beyond the terminus. Slip streaming artifact confirmed inflow of the contralateral unopacified blood through the patent anterior communicating artery. A rotating hemostatic valve was then attached to the back end of the microcatheter, and a pressurized and heparinized saline bag was attached to the catheter. 6.31mm x 45mm Embotrap stent retreiver device was then  selected. Back flush was achieved at the rotating hemostatic valve, and then the device was gently advanced through the microcatheter to the distal end. The retriever was then unsheathed by withdrawing the microcatheter under fluoroscopy, targeting the most distal aspect terminating within the ICA terminus segment proximal to the bifurcation. Once the retriever was completely unsheathed, the microcatheter was carefully stripped from the delivery device. At this time, with the knowledge of the inflow from the patent anterior communicating artery and the A1 segment, the EMBO trap device was left in place while we addressed the occluded proximal ICA and the presumable permissive lesion at the ICA origin. A second and parallel synchro soft microwire was advanced through the balloon guide catheter using the rapid transit microcatheter. We were able to cross the occlusion easily, with a distal position achieved at the skull base within the cervical ICA. The microwire was removed with saline drip at the hub. Contrast injection confirmed location within the distal cervical ICA. We then placed a soft tip rapid transit wire into the petrous segment and removed the rapid transit catheter. We then proceeded with balloon angioplasty of the proximal ICA using a 4 mm x 40 mm Viatrac rapid exchange system. Patient remained hemodynamically stable with the balloon angioplasty performed. We then gently advanced the balloon guide catheter over the Viatrac balloon at the carotid bulb upon deflation of the balloon, with a distal cervical ICA position achieved with the balloon guide catheter. Rapid transit  wire was removed. The zoom 71 catheter was then advanced over the EMBO trap retrieval wire, and advanced to the skull base. The catheter was advanced to the site of occlusion at the cervical ICA. The proprietary engine was turned on, with stasis of flow at the cavernous segment location. Catheter would not track further over the stent  retriever into the terminal ICA segment meeting resistance at the carotid siphon and ophthalmic segment. The working time between deployment of the first EMBO trap and this point was approximately 20-25 minutes. The balloon at the balloon guide catheter was then inflated under fluoroscopy for proximal flow arrest. Constant aspiration using the proprietary engine was then performed at the zoom 71 intermediate catheter, as the retriever was gently and slowly withdrawn with fluoroscopic observation (representing "pass 1" ). Once the retriever was "corked" within the tip of the intermediate catheter, both were removed from the system. Free aspiration was confirmed at the hub of the balloon guide catheter, with free blood return confirmed. The balloon was then deflated, and a control angiogram was performed. 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 Our choice at this point was to re-attempt a thrombectomy/embolectomy (what would be pass number 2) at the carotid siphon. Combination of the synchro soft catheter, Stryker pro 18 microcatheter, and the zoom 71 intermediate catheter were again advanced through the balloon guide. The microcatheter and the microwire were gently advanced through the occlusion within the carotid siphon, again meeting significant resistance through the segment. Once the microwire was within the proximal M1 segment, the microcatheter was advanced to the same position. The intermediate catheter would not advanced beyond the cavernous segment. Microwire was gently withdrawn from the microcatheter, with the saline drip at the hub. Once the microwire was removed, gentle contrast injection was performed at the microcatheter in the M1 segment position. This gentle injection confirmed that there had been embolization to new territory with a new embolism in the M1 beyond the microcatheter. Microwire was then readvanced through the microcatheter.  Microcatheter and microwire were advanced through this new embolism to the M2 segment and the catheter was gently advanced. Microwire was then withdrawn gently with saline drip at the hub. Once the microwire was removed, gentle contrast injection confirmed a luminal position within the M2 segment. For this second pass, 6 mm solitaire device was selected. Back flush was achieved at the rotating hemostatic valve, and then the device was gently advanced through the microcatheter to the distal end. The retriever was then unsheathed by withdrawing the microcatheter under fluoroscopy. The zoom 71 catheter would not pass beyond the cavernous segment through the ophthalmic segment. Once the retriever was completely unsheathed, the microcatheter was carefully stripped from the delivery device. We then observed a 3 minute interval before withdrawing. The balloon at the balloon guide catheter was then inflated under fluoroscopy for proximal flow arrest. Constant aspiration using the proprietary engine was then performed at the zoom 71 intermediate catheter, positioned in the cavernous segment, as the retriever was gently and slowly withdrawn with fluoroscopic observation. Once the retriever was felt to be "corked" within the tip of the intermediate catheter, both were removed from the system. Free aspiration was confirmed at the hub of the balloon guide catheter, with free blood return confirmed. The balloon was then deflated, and a control angiogram was performed. 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 At this point we decided to follow  through with emergent stenting of the known ICA occlusion at the bifurcation/carotid bulb, in an attempt to improve flow through the distal segment. The withdrawal of the balloon guide catheter to the common carotid artery resulted in some instability of the balloon guide through the innominate artery  and the aortic arch. The balloon guide then prolapsed into the aortic arch when we reinserted a wire/catheter combination to address the ICA origin. Balloon guide catheter was then withdrawn into the descending thoracic aorta. 125 cm Berenstein catheter was then advanced through the balloon guide. The catheter was then double flushed in the aorta and then advanced over the aortic arch to engage the innominate artery. Glidewire was then advanced into the common carotid artery and the combination of the coaxial diagnostic catheter and the balloon guide were readvanced into the common carotid artery. The wire was advanced into the external carotid artery and the balloon guide was advanced on the Glidewire. Once the balloon guide was into the proximal external carotid artery, the Glidewire and the diagnostic catheter were withdrawn with constant saline drip at the hub. The balloon guide was then withdrawn below the bifurcation, and angiogram was performed. We then crossed the proximal ICA with a combination of the Stryker pro view microcatheter and a synchro soft catheter. Once we achieved a distal position in the cervical ICA, synchro soft wire was removed and a transcend wire was placed. We then deployed a 9 mm x 7 mm by 40 mm tapered Abbott exact stent. The delivered ice was withdrawn. We then post dilated with a rapid exchange 5 mm x 40 mm Viatrac balloon. The 5 mm balloon dilation did result in transient acute bradycardia into the 30s, which quickly recovered upon deflation of the balloon and withdrawal. No pharmacologic therapy was required. Balloon was withdrawn, and repeat angiogram confirmed continued occlusion at the stent. Our next strategy was for a third attempt at thrombectomy. We then advanced a coaxial Stryker microcatheter through the zoom 71 catheter, and advanced this combination over the Transcend wire. The balloon catheter was then gently advanced through the carotid stent with close observation  under fluoroscopy. There was no perceptible difficulty in advancing through the fresh stent. For this third pass attempt, combination of the synchro soft wire, Stryker pro 18 microcatheter, and the zoom 71 intermediate catheter were again advanced through the balloon guide. The microcatheter and the microwire were gently advanced through the occlusion within the carotid siphon, again meeting significant resistance through the segment. Once the microwire was within the proximal M1 segment, the microcatheter was advanced to the same position. The intermediate catheter would again not advanced beyond the cavernous segment. Microwire was gently withdrawn from the microcatheter, with the saline drip at the hub. Once the microwire was removed, gentle contrast injection was performed at the microcatheter in the M1 segment position. This gentle injection confirmed persistent occlusion in the M1 beyond the microcatheter. Microwire was then readvanced through the microcatheter. Microcatheter and microwire were advanced through this new embolism to the M2 segment and the catheter was gently advanced. Microwire was then withdrawn gently with saline drip at the hub. Once the microwire was removed, gentle contrast injection confirmed a luminal position within the M2 segment. For this third pass, we elected to use the 6 mm solitaire device. Back flush was achieved at the rotating hemostatic valve, and then the device was gently advanced through the microcatheter to the distal end. The retriever was then unsheathed by withdrawing the microcatheter under fluoroscopy. The zoom  71 catheter would again not advance beyond the cavernous segment through the ophthalmic segment. Once the retriever was completely unsheathed, the microcatheter was carefully stripped from the delivery device. We observed a 3 minutes time interval. The balloon at the balloon guide catheter was then inflated under fluoroscopy for proximal flow arrest. Constant  aspiration using the proprietary engine was then performed at the intermediate catheter, as the retriever was gently and slowly withdrawn with fluoroscopic observation. Once the retriever was "corked" within the tip of the intermediate catheter, both were removed from the system. Free aspiration was confirmed at the hub of the balloon guide catheter, with free blood return confirmed. The balloon was then deflated, and a control angiogram was performed. Persistent occlusion of flow was confirmed at the proximal intracranial ICA. Knowing that we needed to prove patency/occlusion of the proximal M1 ENT, we elected to attempt a fourth pass. For this fourth pass attempt, combination of the synchro soft wire, Stryker pro 18 microcatheter, and the zoom 71 intermediate catheter were again advanced through the balloon guide. The microcatheter and the microwire were gently advanced through the vertical and horizontal petrous segment, through the segment lacerum and into the cavernous segment. On this fourth pass, the microwire and the microcatheter would not advance through the ophthalmic segment, meeting significant resistance. With repositioning of both the intermediate catheter and the microcatheter, there was no advantage gained with pushing the microwire through the occlusion, either with a straight/tip configuration or a looped configuration. With further attempt, there was concern for potentially creating dissection or perforation, and we elected to withdraw at this point an attempt a left-sided angiogram to observe for cross flow. The microcatheter microwire and intermediate catheter were withdrawn from the balloon guide and the balloon guide was withdrawn from the right ICA and the sheath. We then advanced the JB 1 catheter to the aortic arch on the 035 diagnostic wire. Wire was withdrawn and the catheter was double flushed. JB 1 catheter was used to engage the innominate artery, though would not engage the bovine  origin of the left common carotid artery. The JB 1 catheter was removed and we placed a Davis catheter. The Davis catheter was used to engage the innominate artery, and although and angiogram roadmap was performed, the Glidewire would not advanced beyond the tortuous segment of the proximal left common carotid artery. We then required a Sim 2 catheter. Sim 2 catheter recur was formed within the innominate artery, and used to engage the bovine origin of the common carotid artery. Angiogram was performed for roadmap and a Glidewire was advanced into the common carotid artery. Once the Sim 2 catheter was in the common carotid artery, angiogram was performed. This confirmed that there was cross-flow from the left to the right across patent anterior communicating artery, however, the M1 occlusion persisted. Rosen wire was advanced into the common carotid artery, to the bifurcation. Sim catheter was removed. The coaxial Berenstein catheter and a new wall wrist balloon guide catheter were advanced on the Rose an wire. As these were advancing into the common carotid artery, the system prolapsed into the aortic arch. The balloon guide and the intermediate catheter were withdrawn from the sheath. The Sim 2 catheter was again advanced to the proximal descending thoracic aorta. Catheter was double flushed. The Sim 2 curve was then reformed within the left subclavian artery. Catheter was withdrawn into the origin of the left common carotid artery. Glidewire was then advanced into the cervical ICA. The Sim catheter was  then advanced further on this attempt, for a position in the cervical ICA. Once the Sim catheter was in the mid to distal segment the Glidewire was removed and a rose in wire was placed into the distal cervical ICA. Sim catheter was removed. We then elected to place a more trackable Trak-star access catheter with a coaxial Berenstein catheter. The coaxial system was advanced to the distal cervical segment. Roadmap  angiogram was performed. A coaxial Stryker Pro 18 catheter, synchro soft wire, and zoom 55 intermediate catheter were advanced to the cavernous segment. We then made an attempt to place the microcatheter across the patent anterior communicating artery, for final attempt at revascularization of the ENT. The origin of the anterior cerebral artery was at such an angle that catheterization was difficult, and after multiple attempts, in order to avoid any primary injury to the left circulation, we elected to withdraw from the case. All catheters and wires were withdrawn. Distal access catheter was withdrawn from the sheath. Flat panel CT was performed. Patient remained intubated, for medical induced coma and observation, with a target blood pressure of permissive hypertension below 180 systolic Patient tolerated the procedure well. Estimated blood loss 500 cc. 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 Left-sided angiogram demonstrates cross fill via patent anterior communicating artery to the right ACA territory. Angio-Seal for hemostasis Signed, Yvone Neu. Reyne Dumas, RPVI [facility] Radiology PLAN: The patient will remain intubated, for medical induced coma purpose. ICU status Target systolic blood pressure of permissive hypertension, less than 180 systolic Right hip straight time 6 hours Frequent neurovascular checks Repeat neurologic imaging with CT and/MRI at the discretion of neurology team  Single antiplatelet agent is reasonable, however, the right ICA stent is occluded, and there is likely no need for dual anti-platelet therapy at this point. Electronically Signed   By: Gilmer Mor D.O.   On: 03/02/2020 13:44   CT HEAD CODE STROKE WO CONTRAST  Result Date: 02/27/2020 CLINICAL DATA:  Code stroke. 80 year old male with left side weakness. EXAM: CT HEAD WITHOUT CONTRAST TECHNIQUE: Contiguous axial images were obtained from the base of the skull through the vertex without intravenous contrast. COMPARISON:  None. FINDINGS: Brain: No midline shift, mass effect, or evidence of intracranial mass lesion. No acute intracranial hemorrhage identified. No ventriculomegaly. Patchy mild to moderate for age bilateral cerebral white matter hypodensity. No cortically based acute infarct identified. Vascular: Calcified atherosclerosis at the skull base. No suspicious intracranial vascular hyperdensity. Skull: Degenerative changes in the visible upper cervical spine. No acute osseous abnormality identified. Sinuses/Orbits: Visualized paranasal sinuses and mastoids are stable and well pneumatized. Other: There is some rightward gaze. No other acute orbit finding. Possible right forehead scalp hematoma or contusion on series 4, image 49. Underlying calvarium intact. ASPECTS [facility] Stroke Program Early CT Score) - Ganglionic level infarction (caudate, lentiform nuclei, internal capsule, insula, M1-M3 cortex): - Supraganglionic infarction (M4-M6 cortex): Total score (0-10 with 10 being normal): IMPRESSION: 1. No acute cortically based infarct or acute intracranial hemorrhage identified. ASPECTS 10. Mild to moderate for age cerebral white matter changes. 2. Possible right forehead scalp hematoma or contusion. No underlying skull  fracture. 3. These results were communicated to Dr.Khaliqdina at 1:29 pm on 2020/03/04 by text page via the [facility] messaging system. Electronically Signed   By: Odessa Fleming M.D.   On: 2020-03-04  13:29   CT ANGIO HEAD CODE STROKE  Result Date: 04-Mar-2020 CLINICAL DATA:  Stroke/TIA.  Left-sided weakness. EXAM: CT ANGIOGRAPHY HEAD AND NECK TECHNIQUE: Multidetector CT imaging of the head and neck was performed using the standard protocol during bolus administration of intravenous contrast. Multiplanar CT image reconstructions and MIPs were obtained to evaluate the vascular anatomy. Carotid stenosis measurements (when applicable) are obtained utilizing NASCET criteria, using the distal internal carotid diameter as the denominator. CONTRAST:  75mL OMNIPAQUE IOHEXOL 350 MG/ML SOLN COMPARISON:  Same day CT head. 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 Right carotid system: There is calcific and noncalcific atherosclerosis at the carotid bifurcation. 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 Non opacification of the remainder of the ICA in the neck. Left carotid system: Calcific atherosclerosis of the common carotid artery. There is calcified and noncalcified atherosclerosis at the bifurcation with a web (see series 6, image 225), but without greater than 50% narrowing. Vertebral arteries: Mildly left dominant. Moderate stenosis of the left vertebral artery origin. Otherwise, no hemodynamically significant stenosis in the neck. 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 Upper chest: No acute abnormality. Review of the MIP images confirms the above findings CTA HEAD FINDINGS Anterior circulation: Occlusion of the right internal carotid artery to the level of the carotid terminus. The right MCA and ACA branches are well opacified proximally. There is moderate stenosis of the right A2 ACA, likely related to atherosclerosis. Posterior circulation: Moderate stenosis of bilateral intradural vertebral arteries secondary to calcific  atherosclerosis. Left fetal type PCA. Moderate stenosis of the proximal right P2 PCA with additional severe stenosis of the more distal P2 PCA. Venous sinuses: As permitted by contrast timing, patent. Review of the MIP images confirms the above findings IMPRESSION: 1. 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 The right MCA and ACA branches are opacified. 2. 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 3. Multifocal moderate atherosclerotic stenoses, including the right A2 ACA, proximal right P2 PCA (in addition to severe distal stenosis), bilateral intradural vertebral arteries, and left vertebral artery origin. 4. 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 5. 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 Conclusion #1 was discussed with Dr. Derry Lory at 1:42 PM via telephone. Electronically Signed   By: Feliberto Harts MD   On: Mar 04, 2020 14:02   CT ANGIO NECK CODE STROKE  Result Date: 03/04/20 CLINICAL DATA:  Stroke/TIA.  Left-sided weakness. EXAM: CT ANGIOGRAPHY HEAD AND NECK TECHNIQUE: Multidetector CT imaging of the head and neck was performed using the standard protocol during bolus administration of intravenous contrast. Multiplanar CT image reconstructions and MIPs were obtained to evaluate the vascular anatomy. Carotid stenosis measurements (when applicable) are obtained utilizing NASCET criteria, using the distal internal carotid diameter as the denominator. CONTRAST:  75mL OMNIPAQUE IOHEXOL 350 MG/ML SOLN COMPARISON:  Same day CT head. 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 Right carotid system: There is calcific and noncalcific atherosclerosis at the carotid bifurcation. There  is occlusion of the cervical internal carotid ICA at its origin. Non opacification  of the remainder of the ICA in the neck. Left carotid system: Calcific atherosclerosis of the common carotid artery. There is calcified and noncalcified atherosclerosis at the bifurcation with a web (see series 6, image 225), but without greater than 50% narrowing. Vertebral arteries: Mildly left dominant. Moderate stenosis of the left vertebral artery origin. Otherwise, no hemodynamically significant stenosis in the neck. 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 Upper chest: No acute abnormality. Review of the MIP images confirms the above findings CTA HEAD FINDINGS Anterior circulation: Occlusion of the right internal carotid artery to the level of the carotid terminus. The right MCA and ACA branches are well opacified proximally. There is moderate stenosis of the right A2 ACA, likely related to atherosclerosis. Posterior circulation: Moderate stenosis of bilateral intradural vertebral arteries secondary to calcific atherosclerosis. Left fetal type PCA. Moderate stenosis of the proximal right P2 PCA with additional severe stenosis of the more distal P2 PCA. Venous sinuses: As permitted by contrast timing, patent. Review of the MIP images confirms the above findings IMPRESSION: 1. 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 The right MCA and ACA branches are opacified. 2. 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 3. Multifocal moderate atherosclerotic stenoses, including the right A2 ACA, proximal right P2 PCA (in addition to severe distal stenosis), bilateral intradural vertebral arteries, and left vertebral artery origin. 4. 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 5. 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 Conclusion #1 was discussed with Dr.  Derry Lory at 1:42 PM via telephone. Electronically Signed   By: Feliberto Harts MD   On: 02/08/2020 14:02      HISTORY OF PRESENT ILLNESS Richard Farmer is a 80 y.o. male with PMH significant for afibb on warfarin, known carotid stenosis who presents with acute onset left sided weakness.  Patient woke up this morning feeling weak in both legs. His wife Richard Farmer states that he was holding on to the walls while walking to the bathroom but he sometimes does that when he wakes up. He was able to get dressed and seemed to be doing fine, he was walking normally and walked to the kitchen fine. He reported that his legs were weak again and family noted that while he attemped to sit in the chair, he was somewhat slumping towards the left and when he stood up, he was slumping to the left side. He looked different but was talking coherently.  He took his morning medications, he is on warfarin but his INR ar presentation was 1.4.  He was noted on initial evaluation to have left sided weakness with left arm drift and leg drift but his symptoms seem to have improved to an NIHSS of 4.  He was noted later during exam to have left visual field cut and throughout the time in the ED, he was noted to have fluctuation of his symptoms. He did discussed tPA for the noted visual field cut. However, by the time tPA was mixed and ready for administration, the hemianopsia resolved and we held off on tPA.  NIHSS: 1 for mild L arm drift but good L hand grip strength with intact Left hand push and pull MRS: 1 TPA: was offered to patient initially  due to L hemianopsia but held off after resolution of the hemianopsia. Thrombectomy: Not pursued but given concern for unstable R ICA disease, case discussed with stroke team and with IR - sent for urgent stenting.    HOSPITAL COURSE Richard Farmer is a 80 y.o. male with history of AF on warfarin, know ICA stenosis presenting with L sided weakness and hemianopsia. He did not receive  tPA.  Taken to IR for emergent stenting of R ICA occlusion.  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 Large vessel disease  CT head no acute abnormality. Possible R forehead scalp hematoma/contusion  CTA head & neck R cervical ICA origin occlusion with reconstitution at terminus. Critical stenosis brachiocephalic artery. Severe stenosis R P2. Multifocal moderate R A2, proximal R P2, severe distal R P2, B mid VA, L VA origin stenoses. B ICA atherosclerosis w/ L ICA web. Severe degenerative C6-7 disc dz.  CT perfusion large penumbra  Cerebral angio / IR - R ICA occlusion from bulb to proximal ICA terminus. Stent R ICA. ICA remains occluded after ICA stent, secondary to occlusion at the supraclinoid segment. Right M1 remains occluded, embolization to new territory.  MRI  R MCA infarct w/ sparing of putamen, globus pallidus and caudate head. Petechial hemorrhage. No shift. Abnormal flor R ICA w/ partial dissection.  CT Head 11/28 - Continued interval evolution of subacute right MCA territory infarct, stable in size and distribution from previous. Slightly worsened localized edema and partial effacement of the right lateral ventricle, but with no more than trace right-to-left midline shift.  2D Echo EF 60-65%. No source of embolus. LA moderately dilated  LDL 42     HgbA1c 6.3  aspirin 81 mg daily and warfarin daily prior to admission w/ INR 1.4 on arrival, treated w/ aspirin 81 mg daily and clopidogrel 75 mg daily.   Therapy recommendations:  CIR vs SNF  Disposition:  stroke large and devastating. Palliative care on board with progressive removal of care by family. Transitioned to full comfort care with morphine gtt.   Death 03-30-20 at 1435  Acute Respiratory Failure  Secondary to stroke  Intubated for IR, extubated  03/02/2020  Still have difficulty clearing secretions  CCM involved   Cytotoxic cerebral edema Induced Hypernatremia  MRI large right MCA infarct with cytotoxic edema and effacement of right lateral ventricle  CT 11/28 - Continued interval evolution of subacute right MCA territory infarct, stable in size and distribution from previous. Slightly worsened localized edema and partial effacement of the right lateral ventricle, but with no more than trace right-to-left midline shift.  On 3% at 75cc -> 50cc ->off   Allow Na gradually trending down  Chronic Atrial Fibrillation on Coumadin with subtherapeutic INR  Home anticoagulation:  warfarin daily + asa 81  Admission INR 1.4 -> 1.5  HR 90-110  Treated w/ DAPT in hospital, Not an AC candidate d/t large stroke sites and risk of hemorrhagic transformation    Aspiration pneumonia LLL  Fever T-max 102.1 - not treated d/t comfort care  Leukocytosis, resolved    Treated w/ Unasyn 7 day course  Resp Cx neg  CCM signed off  Treated we/ Chest PT   NT suctioning as needed  Hypertension  Home meds:  Coreg 12.5 bid, losartan 50, Maxzide  BP on the low end  Resume Coreg 12.5->25->12.5  bid -> d/c  D/c losartan 100  D/c HCTZ 12.5   Hyperlipidemia  Home meds:  lipitor 80 + omega 3  LDL 42, goal < 70  On Lipitor 40->off  Diabetes type II Controlled  Home meds - amaryl  HgbA1c 6.3, goal < 7.0  On Lantus 15->20 U daily  ->off  novolog 4U ->6U Q4h ->off  DB RN Coordinator following  Dysphagia  Secondary to stroke  NPO  Had Cortrak w/ TF   Speech on board  Family does not want long-term feeding   Tobacco abuse  Current smoker  Other Stroke Risk Factors  Advanced Age >/= 15   ETOH use 4x wk  Obesity, Body mass index is 30.99 kg/m., BMI >/= 30 associated with increased stroke risk, recommend weight loss, diet and exercise as appropriate   Family hx stroke (father)  Coronary artery  disease s/p stent  Other Active Problems  Gout   GERD   BPH   Hypokalemia resolved   DEATH:  March 26, 2020 at 1435  R MCA infarct d/t s/p R ICA stent with continued R ICA supraclinoid and R MCA occlusion, etiology of infarct uncertain, afib with subtherapeutic INR vs. Large vessel disease source  20 minutes were spent preparing discharge.  Annie Main, MSN, APRN, ANVP-BC, AGPCNP-BC Advanced Practice Stroke Nurse [facility] See Amion for Schedule & Pager information 03/13/2020 4:06 PM

## 2020-04-07 NOTE — Progress Notes (Signed)
Nutrition Brief Note  Chart reviewed. Pt now transitioning to comfort care.  No further nutrition interventions warranted at this time.  Please re-consult as needed.   Richard Crosland, MS, RD, LDN RD pager number and weekend/on-call pager number located in Amion.    

## 2020-04-07 NOTE — Progress Notes (Signed)
Daily Progress Note   Patient Name: Richard Farmer       Date: 04/07/20 DOB: 11-18-1940  Age: 80 y.o. MRN#: [id number] Attending Physician: Garvin Fila, MD Primary Care Physician: Patient, No Pcp Per Admit Date: 02/17/2020  Reason for Consultation/Follow-up:  Symptom management, terminal care  Subjective: Met with family (wife and 3 children) at patient's bedside this morning.  Discussed that Richard Farmer's breaths have become quick and shallow indicating that he is progressing towards death.   He appears comfortable.   He is still spiking a fever despite IV tylenol. We discussed that this is likely the body's final surge.  There is no urine in the container.  Family appreciative of care being given to Richard Farmer.  They do not want to prolong his dying process and want him to be as comfortable as possible.  Discussed with RNs - greatly appreciate their care and concern for patient and family.  Assessment: Actively dying.  Comfortable.  Unable to transport.   Needs to continue IV tylenol for continued fever.    Patient Profile/HPI:  80 y.o.malewith past medical history of atrial fibrillation on warfarin, carotid stenosisadmitted on 11/24/2021with right MCA infarct with left sided weakness.He has extubated successfully but ongoing concern for aspiration risk.Developed lethargy over the next several days and declined.  12/5 actively dying.   Length of Stay: 12   Vital Signs: BP (!) 132/93 (BP Location: Right Arm)   Pulse 95   Temp (!) 102.1 F (38.9 C) (Axillary)   Resp (!) 22   Ht '5\' 6"'  (1.676 m)   Wt 87.1 kg   SpO2 99%   BMI 30.99 kg/m  SpO2: SpO2: 99 % O2 Device: O2 Device: Nasal Cannula O2 Flow Rate: O2 Flow Rate (L/min): 2 L/min       Palliative Assessment/Data:  10%     Palliative Care Plan    Recommendations/Plan:  Continue current care.   PMT will continue to round for symptom management and family support.  Patient will likely die in next 24 hours.  Not stable for transfer outside of the hospital.  Code Status:  DNR  Prognosis:   Hours - Days   Discharge Planning:  Anticipated Hospital Death  Care plan was discussed with RN and family.  Thank you for allowing the Palliative Medicine Team to  assist in the care of this patient.  Total time spent:  35 min.     Greater than 50%  of this time was spent counseling and coordinating care related to the above assessment and plan.  Florentina Jenny, PA-C Palliative Medicine  Please contact Palliative MedicineTeam phone at [phone] for questions and concerns between 7 am - 7 pm.   Please see AMION for individual provider pager numbers.

## 2020-04-07 NOTE — Progress Notes (Signed)
STROKE TEAM PROGRESS NOTE   INTERVAL HISTORY RN reports labored breathing and elevated temp, on morphine gtt w/ family feeling he is comfortable. On rounds respirators are slow, not labored, appears comfortable. Family at bedside agree. Goal for ongoing pt comfort.  Patient appears to be actively dying.  He is on morphine drip.  Vitals:   03/10/20 2214 03/11/20 0804 03/11/20 2342 04/04/2020 0800  BP: (!) 132/93     Pulse: 93   95  Resp:      Temp:  98.8 F (37.1 C) (!) 100.5 F (38.1 C) (!) 102.1 F (38.9 C)  TempSrc:  Axillary Axillary Axillary  SpO2: 95%   99%  Weight:      Height:        PHYSICAL EXAM      Temp:  [100.5 F (38.1 C)-102.1 F (38.9 C)] 102.1 F (38.9 C) (12/06 0800) Pulse Rate:  [95] 95 (12/06 0800) SpO2:  [99 %] 99 % (12/06 0800)  General - Well nourished, well developed, elderly Caucasian male.. Is sedated on morphine drip  Ophthalmologic - fundi not visualized due to noncooperation.  Cardiovascular - irregularly irregular heart rate and rhythm.  Neuro -  , stuporous, barely able to open eyes and not following any commands now.  . With eye opening, pt not blinking to visual threat bilaterally, right gaze preference, not able to cross midline.  PERRL. Mild left facial weakness.  Tongue midline mouth.  Motor system exam shows slight purposeful right upper extremity movements barely against gravity, RLE withdraw 3-/5 to pain and moving toes PF/DF on request.  Left upper and lower extremity hypotonic and slight withdraw respond to painful stimulus. No babinski bilaterally.  Gait not tested   ASSESSMENT/PLAN [name] is a 80 y.o. male with history of AF on warfarin, know ICA stenosis presenting with L sided weakness and hemianopsia. He did not receive tPA.  Taken to IR for emergent stenting of R ICA occlusion.  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 Large vessel disease  CT head no acute abnormality. Possible R forehead scalp hematoma/contusion  CTA head & neck R cervical ICA origin occlusion with reconstitution at terminus. Critical stenosis brachiocephalic artery. Severe stenosis R P2. Multifocal moderate R A2, proximal R P2, severe distal R P2, B mid VA, L VA origin stenoses. B ICA atherosclerosis w/ L ICA web. Severe degenerative C6-7 disc dz.  CT perfusion large penumbra  Cerebral angio / IR - R ICA occlusion from bulb to proximal ICA terminus. Stent R ICA. ICA remains occluded after ICA stent, secondary to occlusion at the supraclinoid segment. Right M1 remains occluded, embolization to new territory.  MRI  R MCA infarct w/ sparing of putamen, globus pallidus and caudate head. Petechial hemorrhage. No shift. Abnormal flor R ICA w/ partial dissection.  CT Head 11/28 - Continued interval evolution of subacute right MCA territory infarct, stable in size and distribution from previous. Slightly worsened localized edema and partial effacement of the right lateral ventricle, but with no more than trace right-to-left midline shift.  2D Echo EF 60-65%. No source of embolus. LA moderately dilated  LDL 42     HgbA1c 6.3  aspirin 81 mg daily and warfarin daily prior to admission w/ INR 1.4 on arrival, treated w/ aspirin 81 mg daily and clopidogrel 75 mg daily.  Therapy recommendations:  CIR vs SNF  Disposition:  pending - patient now full comfort care of morphine gtt. Palliative care onboard. Anticipate hospital death.   Acute Respiratory Failure  Secondary to stroke  Intubated for IR, extubated 03/02/2020  Still have difficulty clearing secretions  CCM sign off   Cytotoxic cerebral edema Induced Hypernatremia  MRI large right MCA infarct with cytotoxic edema and effacement of right lateral ventricle  CT 11/28 - Continued interval evolution of subacute right MCA territory  infarct, stable in size and distribution from previous. Slightly worsened localized edema and partial effacement of the right lateral ventricle, but with no more than trace right-to-left midline shift.  On 3% at 75cc -> 50cc ->off   Allow Na gradually trending down  Chronic Atrial Fibrillation on Coumadin with subtherapeutic INR  Home anticoagulation:  warfarin daily + asa 81  Admission INR 1.4 -> 1.5  HR 90-110  Treated w/ DAPT in hospital, Not an AC candidate d/t large stroke sites and risk of hemorrhagic transformation    Aspiration pneumonia LLL  Fever T-max 102.1 - not treated d/t comfort care  Leukocytosis, resolved    Off Unasyn 7 day course  Resp Cx neg  CCM signed off  NT suctioning as needed  Off Chest PT now  Hypertension  Home meds:  Coreg 12.5 bid, losartan 50, Maxzide . BP on the low end . Resume Coreg 12.5->25->12.5 bid -> d/c . D/c losartan 100 . D/c HCTZ 12.5   Hyperlipidemia  Home meds:  lipitor 80 + omega 3  LDL 42, goal < 70  On Lipitor 40->off  Diabetes type II Controlled  Home meds - amaryl  HgbA1c 6.3, goal < 7.0  On Lantus 15->20 U daily  ->off  novolog 4U ->6U Q4h ->off  DB RN Coordinator following  Dysphagia . Secondary to stroke . NPO . Had Cortrak w/ TF  . Speech on board . Family does not want long-term feeding   Tobacco abuse  Current smoker  Other Stroke Risk Factors  Advanced Age >/= 89   ETOH use 4x wk  Obesity, Body mass index is 30.99 kg/m., BMI >/= 30 associated with increased stroke risk, recommend weight loss, diet and exercise as appropriate   Family hx stroke (father)  Coronary artery disease s/p stent  Other Active Problems  Gout   GERD   BPH   Hypokalemia resolved  Continue comfort care measures only as per family request.  IV morphine drip.  Appreciate help from palliative care team.  Anticipate demise over the next 1 to 2 days.  Greater than 50% time during this 25-minute visit was  spent in counseling and coordination of care and discussion multiple family members and answering questions about his care. Delia Heady, MD Hospital day # 12     To contact Stroke Continuity provider, please refer to [email or web address]. After hours, contact General Neurology

## 2020-04-07 DEATH — deceased

## 2021-05-05 IMAGING — CT CT HEAD W/O CM
5 of 6 series · 17 of 47 positions shown, 18 images · non-contrast
Comparison: Prior CT from 03/02/2020.

CLINICAL DATA: Follow-up examination for acute stroke.

EXAM:
CT HEAD WITHOUT CONTRAST
TECHNIQUE: Contiguous axial images were obtained from the base of the skull
through the vertex without intravenous contrast.

[Series 3: head bone · axial · 0.46mm/px · z∈[-177,-45]mm · 7 of 95 slices shown]
[im 10/95  bone]
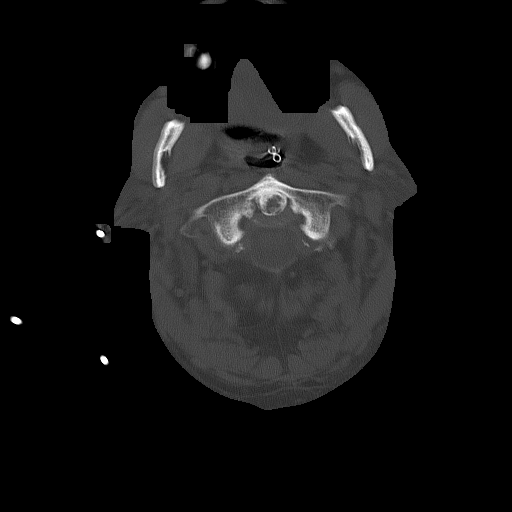
[im 19/95  bone]
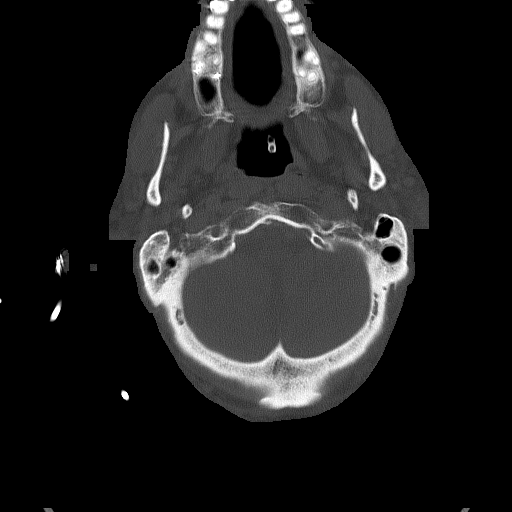
[im 29/95  bone]
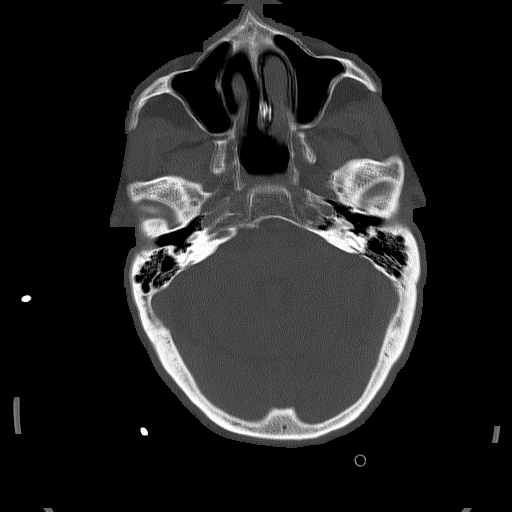
[im 38/95  bone]
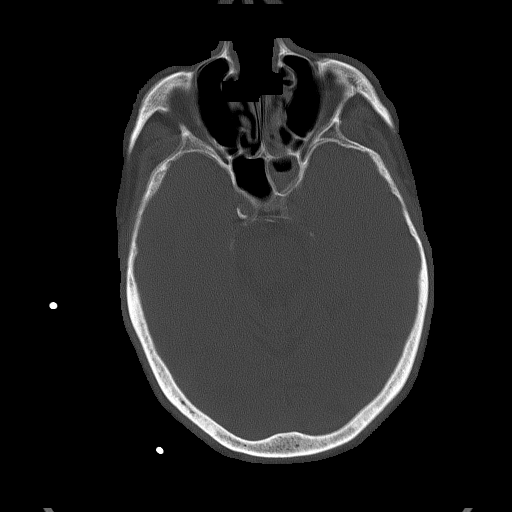
[im 57/95  bone]
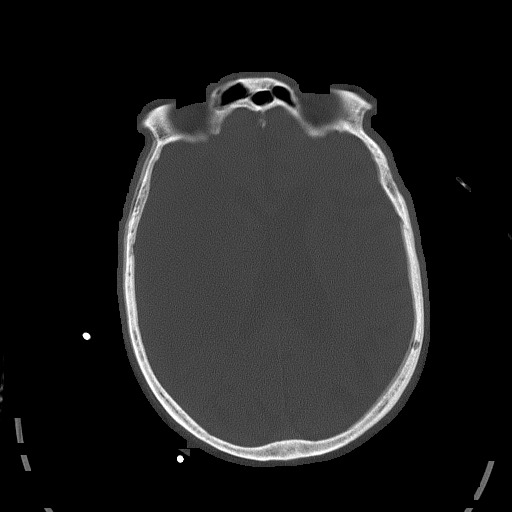
[im 66/95  bone]
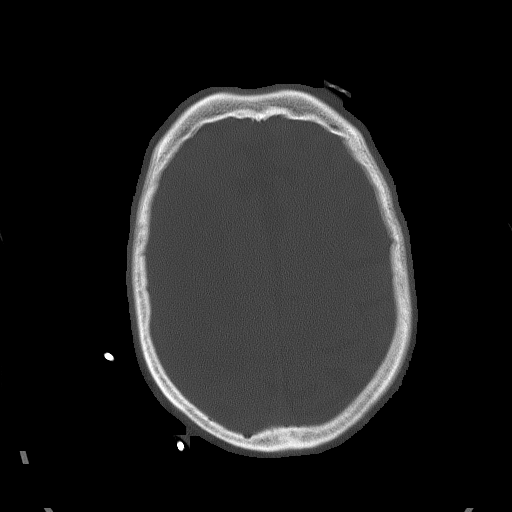
[im 76/95  bone]
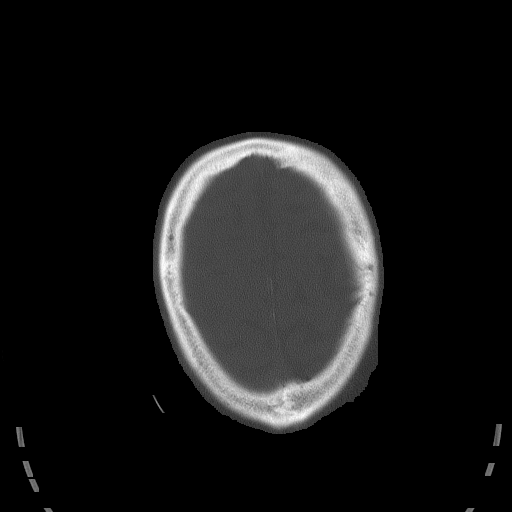

[Series 4: head without · axial · non-contrast · 0.46mm/px · z∈[-135,-75]mm · 2 of 38 slices shown, 3 images (1 of 2)]
[im 13/38  brain]
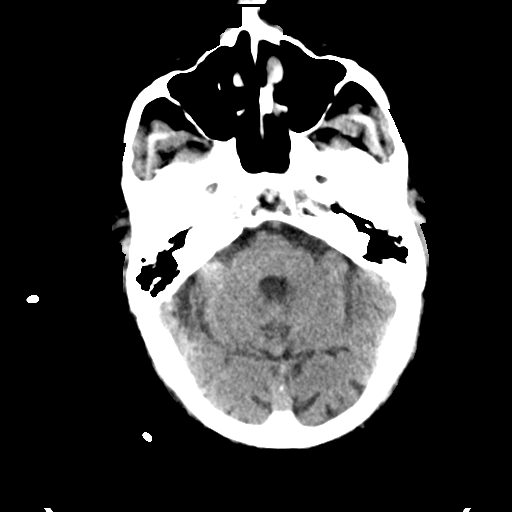
[im 13/38  bone]
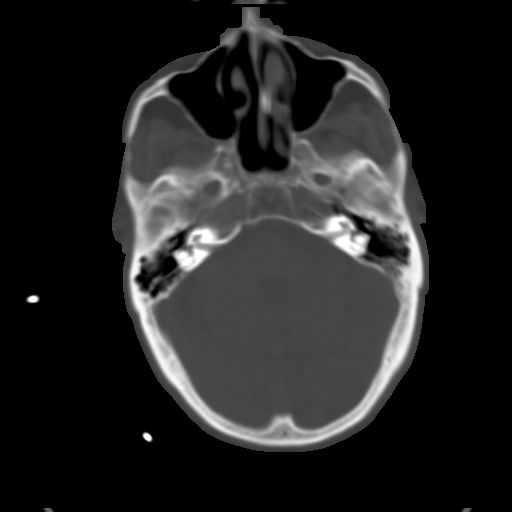
[im 25/38  brain]
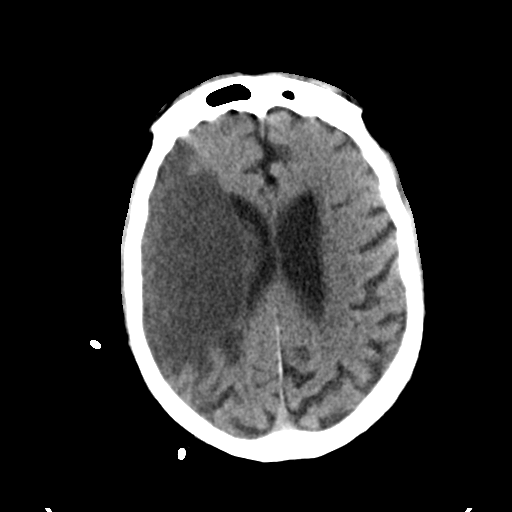

[Series 5: head without cor · coronal · non-contrast · 0.37mm/px · 3 of 71 slices shown]
[im 24/71  brain]
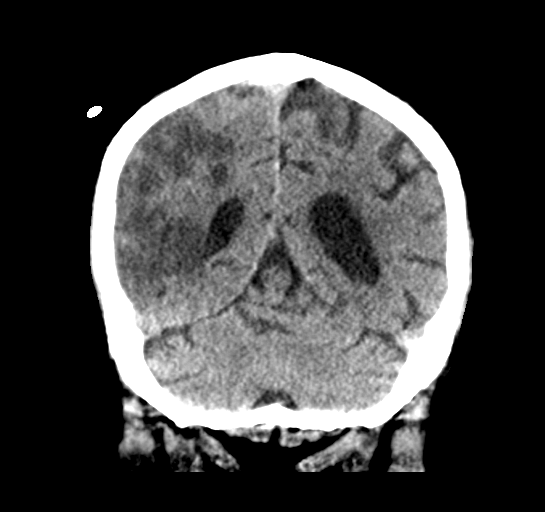
[im 32/71  brain]
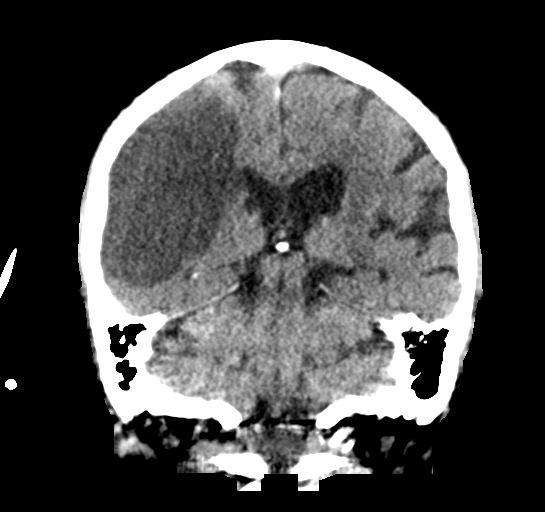
[im 39/71  brain]
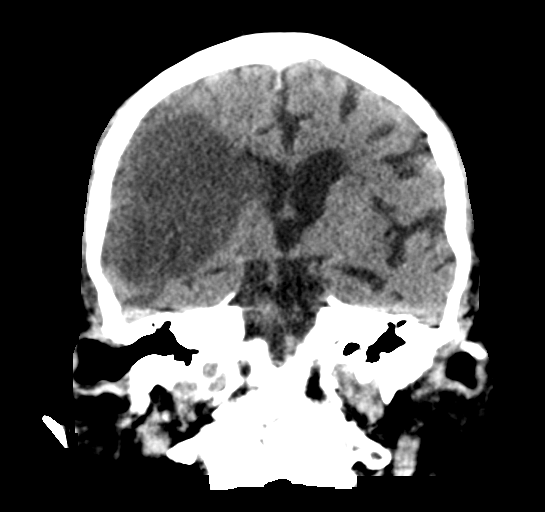

[Series 6: head without sag · sagittal · non-contrast · 0.37mm/px · 3 of 66 slices shown]
[im 22/66  brain]
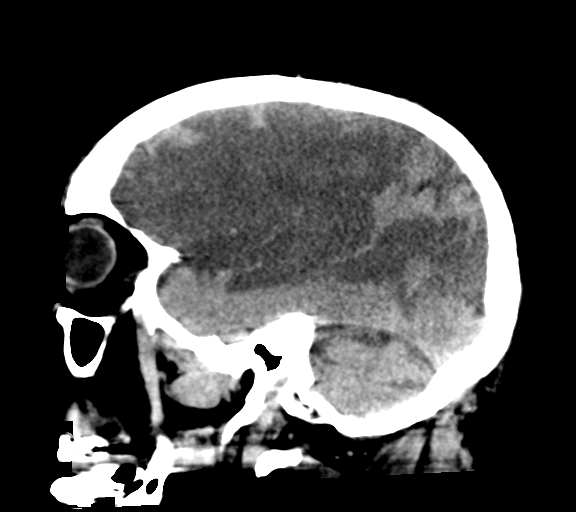
[im 33/66  brain]
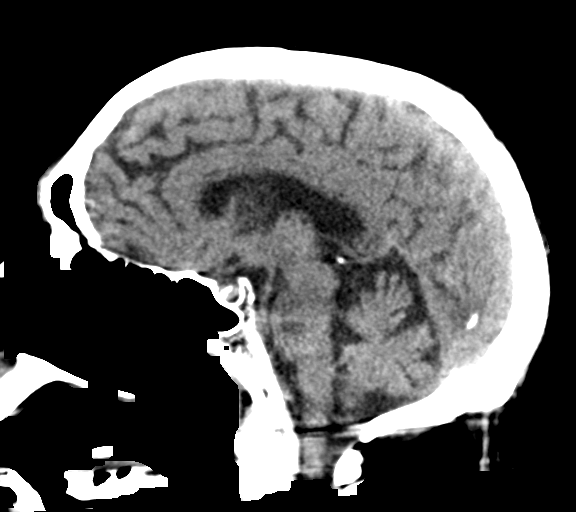
[im 44/66  brain]
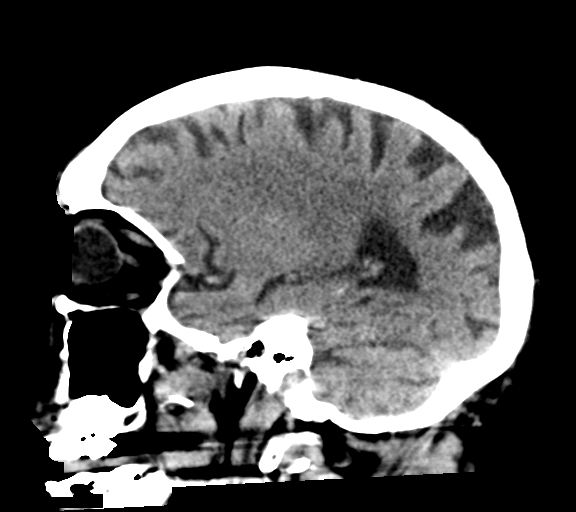

[Series 7: head without · axial · non-contrast · 0.46mm/px · z∈[-135,-75]mm · 2 of 38 slices shown (2 of 2)]
[im 13/38  brain]
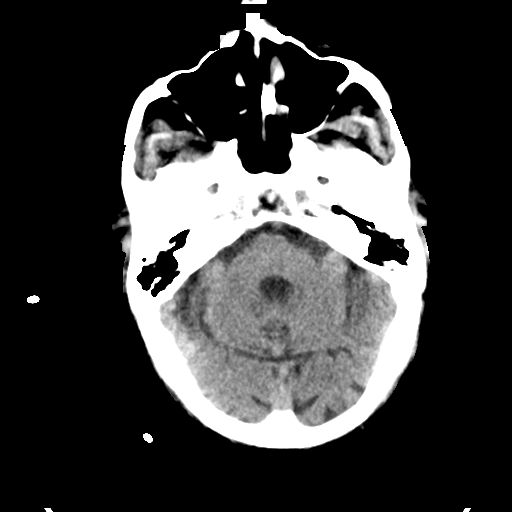
[im 25/38  brain]
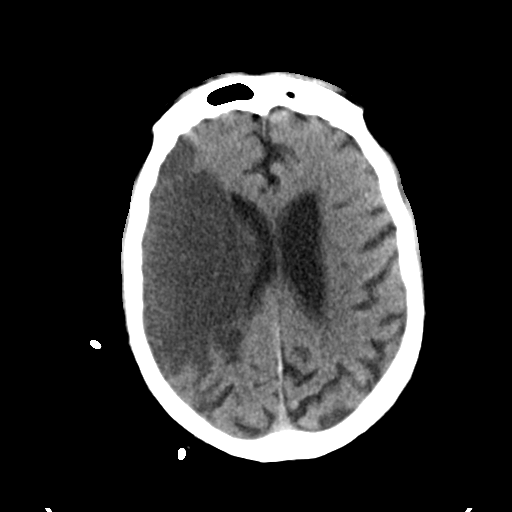

[17 of 47 positions shown; findings below may reference images not displayed]

FINDINGS: Brain: Continued interval evolution of cytotoxic edema involving a
large portion of the right frontal and temporal regions, consistent
with evolving subacute right MCA territory infarct. Overall, size
and distribution is not significantly changed from previous.
Involvement of the right basal ganglia again noted. Localized edema
with partial effacement of the right lateral ventricle has slightly
worsened from previous. No more than trace right-to-left midline
shift. No hydrocephalus or trapping. Basilar cisterns remain widely
patent. No evidence for hemorrhagic transformation.

Remainder the brain is otherwise stable in appearance. No other new
large vessel territory infarct. Underlying atrophy with chronic
microvascular ischemic disease again noted. No extra-axial fluid
collection.

Vascular: No new hyperdense vessel. Calcified atherosclerosis
present at skull base.

Skull: Scalp soft tissues and calvarium are unchanged and remain
within normal limits.

Sinuses/Orbits: Globes and orbital soft tissues demonstrate no acute
finding. Left sphenoid sinus disease noted. Nasogastric tube in
place in the left nasal cavity. Mastoid air cells remain clear.

Other: None.
IMPRESSION: 1. Continued interval evolution of subacute right MCA territory
infarct, stable in size and distribution from previous. Slightly
worsened localized edema and partial effacement of the right lateral
ventricle, but with no more than trace right-to-left midline shift.
No hydrocephalus or trapping. No evidence for hemorrhagic
transformation.
2. No other new acute intracranial abnormality.
